# Patient Record
Sex: Female | Born: 1975
Health system: Southern US, Community
[De-identification: ages and names within clinical notes are randomized; demographics above are authoritative.]

## PROBLEM LIST (undated history)

## (undated) DIAGNOSIS — J3801 Paralysis of vocal cords and larynx, unilateral: Secondary | ICD-10-CM

## (undated) DIAGNOSIS — K219 Gastro-esophageal reflux disease without esophagitis: Secondary | ICD-10-CM

## (undated) DIAGNOSIS — R51 Headache: Secondary | ICD-10-CM

## (undated) DIAGNOSIS — G4733 Obstructive sleep apnea (adult) (pediatric): Secondary | ICD-10-CM

## (undated) DIAGNOSIS — G8929 Other chronic pain: Secondary | ICD-10-CM

## (undated) DIAGNOSIS — F32A Depression, unspecified: Secondary | ICD-10-CM

## (undated) DIAGNOSIS — N83202 Unspecified ovarian cyst, left side: Secondary | ICD-10-CM

## (undated) DIAGNOSIS — I499 Cardiac arrhythmia, unspecified: Secondary | ICD-10-CM

## (undated) DIAGNOSIS — R519 Headache, unspecified: Secondary | ICD-10-CM

## (undated) DIAGNOSIS — G43009 Migraine without aura, not intractable, without status migrainosus: Secondary | ICD-10-CM

## (undated) DIAGNOSIS — D510 Vitamin B12 deficiency anemia due to intrinsic factor deficiency: Secondary | ICD-10-CM

## (undated) DIAGNOSIS — R55 Syncope and collapse: Secondary | ICD-10-CM

## (undated) DIAGNOSIS — T7840XA Allergy, unspecified, initial encounter: Secondary | ICD-10-CM

## (undated) DIAGNOSIS — G4730 Sleep apnea, unspecified: Secondary | ICD-10-CM

## (undated) DIAGNOSIS — E039 Hypothyroidism, unspecified: Secondary | ICD-10-CM

## (undated) DIAGNOSIS — E119 Type 2 diabetes mellitus without complications: Secondary | ICD-10-CM

## (undated) DIAGNOSIS — F419 Anxiety disorder, unspecified: Secondary | ICD-10-CM

## (undated) DIAGNOSIS — M1990 Unspecified osteoarthritis, unspecified site: Secondary | ICD-10-CM

## (undated) HISTORY — DX: Unspecified ovarian cyst, left side: N83.202

## (undated) HISTORY — PX: WRIST SURGERY: SHX841

## (undated) HISTORY — PX: OVARIAN CYST REMOVAL: SHX89

## (undated) HISTORY — DX: Vitamin B12 deficiency anemia due to intrinsic factor deficiency: D51.0

## (undated) HISTORY — DX: Cardiac arrhythmia, unspecified: I49.9

## (undated) HISTORY — DX: Sleep apnea, unspecified: G47.30

## (undated) HISTORY — DX: Gastro-esophageal reflux disease without esophagitis: K21.9

## (undated) HISTORY — DX: Unspecified osteoarthritis, unspecified site: M19.90

## (undated) HISTORY — DX: Other chronic pain: G89.29

## (undated) HISTORY — DX: Anxiety disorder, unspecified: F41.9

## (undated) HISTORY — PX: WISDOM TOOTH EXTRACTION: SHX21

## (undated) HISTORY — DX: Hypothyroidism, unspecified: E03.9

## (undated) HISTORY — DX: Depression, unspecified: F32.A

## (undated) HISTORY — DX: Allergy, unspecified, initial encounter: T78.40XA

## (undated) HISTORY — DX: Obstructive sleep apnea (adult) (pediatric): G47.33

## (undated) HISTORY — DX: Headache, unspecified: R51.9

## (undated) HISTORY — DX: Migraine without aura, not intractable, without status migrainosus: G43.009

## (undated) HISTORY — DX: Paralysis of vocal cords and larynx, unilateral: J38.01

## (undated) HISTORY — PX: BREAST BIOPSY: SHX20

## (undated) HISTORY — DX: Headache: R51

---
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
## 2002-04-07 HISTORY — PX: CHOLECYSTECTOMY: SHX55

## 2004-01-06 DIAGNOSIS — R55 Syncope and collapse: Secondary | ICD-10-CM | POA: Insufficient documentation

## 2007-02-19 DIAGNOSIS — G43809 Other migraine, not intractable, without status migrainosus: Secondary | ICD-10-CM | POA: Insufficient documentation

## 2008-07-12 DIAGNOSIS — F4321 Adjustment disorder with depressed mood: Secondary | ICD-10-CM | POA: Insufficient documentation

## 2010-06-14 DIAGNOSIS — K449 Diaphragmatic hernia without obstruction or gangrene: Secondary | ICD-10-CM | POA: Insufficient documentation

## 2010-09-23 DIAGNOSIS — Z0000 Encounter for general adult medical examination without abnormal findings: Secondary | ICD-10-CM | POA: Insufficient documentation

## 2010-09-23 DIAGNOSIS — R55 Syncope and collapse: Secondary | ICD-10-CM | POA: Insufficient documentation

## 2011-05-22 DIAGNOSIS — M5430 Sciatica, unspecified side: Secondary | ICD-10-CM | POA: Insufficient documentation

## 2013-05-08 DIAGNOSIS — J209 Acute bronchitis, unspecified: Secondary | ICD-10-CM | POA: Insufficient documentation

## 2014-03-11 DIAGNOSIS — R635 Abnormal weight gain: Secondary | ICD-10-CM | POA: Insufficient documentation

## 2014-03-16 DIAGNOSIS — N83202 Unspecified ovarian cyst, left side: Secondary | ICD-10-CM

## 2014-03-16 HISTORY — DX: Unspecified ovarian cyst, left side: N83.202

## 2014-04-07 LAB — HM PAP SMEAR: HM Pap smear: NORMAL

## 2016-01-09 ENCOUNTER — Encounter (HOSPITAL_BASED_OUTPATIENT_CLINIC_OR_DEPARTMENT_OTHER): Payer: Self-pay | Admitting: Emergency Medicine

## 2016-01-09 ENCOUNTER — Emergency Department (HOSPITAL_BASED_OUTPATIENT_CLINIC_OR_DEPARTMENT_OTHER)
Admission: EM | Admit: 2016-01-09 | Discharge: 2016-01-10 | Disposition: A | Discharge status: Home / Self Care | Payer: 59 | Attending: Emergency Medicine | Admitting: Emergency Medicine

## 2016-01-09 ENCOUNTER — Emergency Department (HOSPITAL_BASED_OUTPATIENT_CLINIC_OR_DEPARTMENT_OTHER): Payer: 59

## 2016-01-09 DIAGNOSIS — R112 Nausea with vomiting, unspecified: Secondary | ICD-10-CM

## 2016-01-09 DIAGNOSIS — Z79899 Other long term (current) drug therapy: Secondary | ICD-10-CM | POA: Insufficient documentation

## 2016-01-09 DIAGNOSIS — R109 Unspecified abdominal pain: Secondary | ICD-10-CM | POA: Diagnosis present

## 2016-01-09 DIAGNOSIS — R197 Diarrhea, unspecified: Secondary | ICD-10-CM

## 2016-01-09 DIAGNOSIS — N83202 Unspecified ovarian cyst, left side: Secondary | ICD-10-CM | POA: Diagnosis not present

## 2016-01-09 HISTORY — DX: Syncope and collapse: R55

## 2016-01-09 LAB — URINALYSIS, ROUTINE W REFLEX MICROSCOPIC
Bilirubin Urine: NEGATIVE
GLUCOSE, UA: NEGATIVE mg/dL
KETONES UR: 15 mg/dL — AB
LEUKOCYTES UA: NEGATIVE
NITRITE: NEGATIVE
PH: 6 (ref 5.0–8.0)
PROTEIN: NEGATIVE mg/dL
Specific Gravity, Urine: >1.046 — ABNORMAL HIGH (ref 1.005–1.030)

## 2016-01-09 LAB — COMPREHENSIVE METABOLIC PANEL
ALBUMIN: 4.1 g/dL (ref 3.5–5.0)
ALT: 21 U/L (ref 14–54)
AST: 26 U/L (ref 15–41)
Alkaline Phosphatase: 41 U/L (ref 38–126)
Anion gap: 11 (ref 5–15)
BUN: 11 mg/dL (ref 6–20)
CHLORIDE: 101 mmol/L (ref 101–111)
CO2: 24 mmol/L (ref 22–32)
CREATININE: 0.71 mg/dL (ref 0.44–1.00)
Calcium: 9.1 mg/dL (ref 8.9–10.3)
GFR calc Af Amer: >60 mL/min (ref >60)
GFR calc non Af Amer: >60 mL/min (ref >60)
Glucose, Bld: 116 mg/dL — ABNORMAL HIGH (ref 65–99)
POTASSIUM: 3.6 mmol/L (ref 3.5–5.1)
SODIUM: 136 mmol/L (ref 135–145)
Total Bilirubin: 0.3 mg/dL (ref 0.3–1.2)
Total Protein: 7.9 g/dL (ref 6.5–8.1)

## 2016-01-09 LAB — CBC WITH DIFFERENTIAL/PLATELET
BASOS ABS: 0 10*3/uL (ref 0.0–0.1)
Basophils Relative: 0 %
EOS ABS: 0.1 10*3/uL (ref 0.0–0.7)
EOS PCT: 0 %
HCT: 40.3 % (ref 36.0–46.0)
Hemoglobin: 12.8 g/dL (ref 12.0–15.0)
LYMPHS PCT: 10 %
Lymphs Abs: 2.3 10*3/uL (ref 0.7–4.0)
MCH: 27.4 pg (ref 26.0–34.0)
MCHC: 31.8 g/dL (ref 30.0–36.0)
MCV: 86.3 fL (ref 78.0–100.0)
MONO ABS: 1.6 10*3/uL — AB (ref 0.1–1.0)
Monocytes Relative: 7 %
Neutro Abs: 18.9 10*3/uL — ABNORMAL HIGH (ref 1.7–7.7)
Neutrophils Relative %: 83 %
PLATELETS: 371 10*3/uL (ref 150–400)
RBC: 4.67 MIL/uL (ref 3.87–5.11)
RDW: 14.3 % (ref 11.5–15.5)
WBC: 22.9 10*3/uL — AB (ref 4.0–10.5)

## 2016-01-09 LAB — URINE MICROSCOPIC-ADD ON: WBC UA: NONE SEEN WBC/hpf (ref 0–5)

## 2016-01-09 LAB — PREGNANCY, URINE: PREG TEST UR: NEGATIVE

## 2016-01-09 LAB — LIPASE, BLOOD: LIPASE: 28 U/L (ref 11–51)

## 2016-01-09 MED ORDER — ONDANSETRON HCL 4 MG/2ML IJ SOLN
4.0000 mg | Freq: Once | INTRAMUSCULAR | Status: AC
  Administered 2016-01-09: 4 mg via INTRAVENOUS
  Filled 2016-01-09: qty 2

## 2016-01-09 MED ORDER — KETOROLAC TROMETHAMINE 30 MG/ML IJ SOLN
30.0000 mg | Freq: Once | INTRAMUSCULAR | Status: AC
  Administered 2016-01-09: 30 mg via INTRAVENOUS
  Filled 2016-01-09: qty 1

## 2016-01-09 MED ORDER — ONDANSETRON 8 MG PO TBDP: 8.0000 mg | ORAL_TABLET | Freq: Three times a day (TID) | ORAL | 0 refills | Status: DC | PRN

## 2016-01-09 MED ORDER — SODIUM CHLORIDE 0.9 % IV BOLUS (SEPSIS)
1000.0000 mL | Freq: Once | INTRAVENOUS | Status: AC
  Administered 2016-01-09: 1000 mL via INTRAVENOUS

## 2016-01-09 MED ORDER — IOPAMIDOL (ISOVUE-300) INJECTION 61%
100.0000 mL | Freq: Once | INTRAVENOUS | Status: AC | PRN
  Administered 2016-01-09: 100 mL via INTRAVENOUS

## 2016-01-09 NOTE — ED Provider Notes (Signed)
[facility] Provider Note   CSN: [id number] Arrival date & time: 01/09/16  1737  By signing my name below, I, Soijett Blue, attest that this documentation has been prepared under the direction and in the presence of Santiago Glad, PA-C Electronically Signed: Soijett Blue, ED Scribe. 01/09/16. 5:55 PM.   History   Chief Complaint Chief Complaint  Patient presents with  . Abdominal Pain    HPI  Debra West is a 40 y.o. female who presents to the Emergency Department complaining of abdominal cramping onset 4 PM today. Pt notes that her symptoms began with HA and nausea. Pt denies sick contacts. She states that she is having associated symptoms of nausea, vomiting x 5 episodes, diarrhea x 5 episodes, and HA. She states that she has not tried any medications for the relief of her symptoms. She denies blood in stool, fever, dysuria, urinary frequency, and any other symptoms. Denies recent hospitalization or foreign travel. Pt states that she has had a cholecystectomy in 2004 and ovarian cyst removal in 2010.    The history is provided by the patient. No language interpreter was used.    Past Medical History:  Diagnosis Date  . Vasodepressor syncope     There are no active problems to display for this patient.   Past Surgical History:  Procedure Laterality Date  . CHOLECYSTECTOMY    . OVARIAN CYST REMOVAL      OB History    No data available       Home Medications    Prior to Admission medications   Medication Sig Start Date End Date Taking? Authorizing Provider  UNKNOWN TO PATIENT Birth control   Yes Historical Provider, MD    Family History No family history on file.  Social History Social History  Substance Use Topics  . Smoking status: Never Smoker  . Smokeless tobacco: Current User  . Alcohol use Not on file     Allergies   Review of patient's allergies indicates no known allergies.   Review of Systems Review of Systems    Constitutional: Negative for fever.  Gastrointestinal: Positive for abdominal pain, diarrhea, nausea and vomiting. Negative for blood in stool.  Genitourinary: Negative for dysuria and frequency.  Neurological: Positive for headaches.     Physical Exam Updated Vital Signs BP 99/65 (BP Location: Right Arm)   Pulse 78   Temp 98.8 F (37.1 C) (Oral)   Resp 18   Ht 5\' 6"  (1.676 m)   Wt 250 lb (113.4 kg)   LMP 12/19/2015   SpO2 100%   BMI 40.35 kg/m   Physical Exam  Constitutional: She is oriented to person, place, and time. She appears well-developed and well-nourished. No distress.  HENT:  Head: Normocephalic and atraumatic.  Eyes: EOM are normal.  Neck: Neck supple.  Cardiovascular: Normal rate, regular rhythm and normal heart sounds.  Exam reveals no gallop and no friction rub.   No murmur heard. Pulmonary/Chest: Effort normal and breath sounds normal. No respiratory distress. She has no wheezes. She has no rales.  Abdominal: Soft. She exhibits no distension. There is tenderness in the suprapubic area and left lower quadrant. There is no rebound and no guarding.  Mild TTP across LLQ and suprapubic  Musculoskeletal: Normal range of motion.  Neurological: She is alert and oriented to person, place, and time. She has normal strength. No cranial nerve deficit. Gait normal. GCS eye subscore is 4. GCS verbal subscore is 5. GCS motor subscore is 6.  Skin:  Skin is warm and dry.  Psychiatric: She has a normal mood and affect. Her behavior is normal.  Nursing note and vitals reviewed.    ED Treatments / Results  DIAGNOSTIC STUDIES: Oxygen Saturation is 95% on RA, adequate by my interpretation.    COORDINATION OF CARE: 5:52 PM Discussed treatment plan with pt at bedside which includes zofran, IV fluids, labs, and pt agreed to plan.   Labs (all labs ordered are listed, but only abnormal results are displayed) Labs Reviewed  CBC WITH DIFFERENTIAL/PLATELET - Abnormal; Notable  for the following:       Result Value   WBC 22.9 (*)    Neutro Abs 18.9 (*)    Monocytes Absolute 1.6 (*)    All other components within normal limits  COMPREHENSIVE METABOLIC PANEL - Abnormal; Notable for the following:    Glucose, Bld 116 (*)    All other components within normal limits  URINALYSIS, ROUTINE W REFLEX MICROSCOPIC (NOT AT [facility]) - Abnormal; Notable for the following:    Specific Gravity, Urine >1.046 (*)    Hgb urine dipstick TRACE (*)    Ketones, ur 15 (*)    All other components within normal limits  URINE MICROSCOPIC-ADD ON - Abnormal; Notable for the following:    Squamous Epithelial / LPF 0-5 (*)    Bacteria, UA RARE (*)    All other components within normal limits  LIPASE, BLOOD  PREGNANCY, URINE    EKG  EKG Interpretation None       Radiology Ct Abdomen Pelvis W Contrast  Result Date: 01/09/2016 CLINICAL DATA:  Lower abdominal pain beginning today with headache, nausea, vomiting common diarrhea. History of cholecystectomy and ovarian cyst removal. EXAM: CT ABDOMEN AND PELVIS WITH CONTRAST TECHNIQUE: Multidetector CT imaging of the abdomen and pelvis was performed using the standard protocol following bolus administration of intravenous contrast. CONTRAST:  ISOVUE-300 IOPAMIDOL (ISOVUE-300) INJECTION 61% COMPARISON:  None. FINDINGS: Lower chest: Clear lung bases. Hepatobiliary: Diffusely decreased attenuation of the liver consistent with steatosis. No focal liver abnormality identified. Prior cholecystectomy. No biliary dilatation. Pancreas: Unremarkable. Spleen: Unremarkable. Adrenals/Urinary Tract: Unremarkable adrenal glands. No evidence of renal mass, calculi, or hydronephrosis. No ureteral calculi or ureteral dilatation. Unremarkable bladder. Stomach/Bowel: The stomach and is within normal limits. Oral contrast is present in multiple nondilated loops of small bowel without evidence of obstruction. There is left-sided colonic diverticulosis without  evidence of diverticulitis. The appendix is not clearly identified, however no inflammatory changes are seen in the right lower quadrant. Vascular/Lymphatic: No significant vascular findings are present. No enlarged abdominal or pelvic lymph nodes. Reproductive: Uterus and right ovary are unremarkable. The left ovary is asymmetrically enlarged with multiple hypoattenuating foci suggestive of cysts measuring up to 3.3 cm. Other: No intraperitoneal free fluid. No abdominal wall mass or hernia. Musculoskeletal: Mild thoracolumbar spondylosis. IMPRESSION: 1. No definite acute abnormality identified in the abdomen or pelvis. 2. Enlarged left ovary likely containing multiple cysts. Pelvic ultrasound could provide further characterization if there is clinical concern for a gynecologic cause of the patient's acute pain. 3. Hepatic steatosis. Electronically Signed   By: Sebastian Ache M.D.   On: 01/09/2016 22:37    Procedures Procedures (including critical care time)  Medications Ordered in ED Medications  ketorolac (TORADOL) 30 MG/ML injection 30 mg (not administered)  ondansetron (ZOFRAN) injection 4 mg (4 mg Intravenous Given 01/09/16 1747)  sodium chloride 0.9 % bolus 1,000 mL (0 mLs Intravenous Stopped 01/09/16 1906)  ondansetron (ZOFRAN) injection 4 mg (  4 mg Intravenous Given 01/09/16 1957)  iopamidol (ISOVUE-300) 61 % injection 100 mL (100 mLs Intravenous Contrast Given 01/09/16 2044)     Initial Impression / Assessment and Plan / ED Course  I have reviewed the triage vital signs and the nursing notes.  Pertinent labs & imaging results that were available during my care of the patient were reviewed by me and considered in my medical decision making (see chart for details).  Clinical Course     Final Clinical Impressions(s) / ED Diagnoses   Final diagnoses:  None   Patient presents today with nausea, vomiting, and diarrhea onset earlier today.  Labs showing leukocytosis, but otherwise  unremarkable.  She does have some LLQ abdominal pain on exam.  CT showing left ovarian cyst, but negative for acute findings.  Discussed CT results with the patient.  She declines pelvic exam or ultrasound.  She states that she knew about the ovarian cysts.  Nausea and pain improved in the ED.  Patient able to tolerate PO liquids prior to discharge.  No episodes of vomiting while in the ED.  Feel that the patient is stable for discharge.  Return precautions given.     New Prescriptions New Prescriptions   No medications on file   I personally performed the services described in this documentation, which was scribed in my presence. The recorded information has been reviewed and is accurate.     Santiago GladHeather Lenzi Marmo, PA-C 01/11/16 1127    Melene Planan Floyd, DO 01/12/16 [phone]

## 2016-01-09 NOTE — ED Notes (Signed)
Patient reports that she has no urge to urinate at this time, and states that so many people have asked. Reports that she has not voided since she started the N/V  - at 4 pm

## 2016-01-09 NOTE — ED Triage Notes (Signed)
N/V/D with some abdominal pain since 4 pm.  Pt had lunch at 1:30.  Pt vomitted 4-5 times.  Diarrhea x 4-5 times.

## 2016-01-09 NOTE — ED Notes (Signed)
Patient asked to go to the bathroom to obtain a specimen and asked if she can go patient replie "nope, I am super dehydrated"  - patient states that "maybe after the CT scan after I drink all that contrast"

## 2016-01-17 ENCOUNTER — Encounter: Payer: Self-pay | Admitting: Family Medicine

## 2016-01-17 ENCOUNTER — Ambulatory Visit (INDEPENDENT_AMBULATORY_CARE_PROVIDER_SITE_OTHER): Payer: 59 | Admitting: Family Medicine

## 2016-01-17 ENCOUNTER — Telehealth: Payer: Self-pay | Admitting: Family Medicine

## 2016-01-17 VITALS — BP 122/78 | HR 74 | Temp 99.2°F | Ht 66.0 in | Wt 246.6 lb

## 2016-01-17 DIAGNOSIS — Z1231 Encounter for screening mammogram for malignant neoplasm of breast: Secondary | ICD-10-CM

## 2016-01-17 DIAGNOSIS — K529 Noninfective gastroenteritis and colitis, unspecified: Secondary | ICD-10-CM | POA: Diagnosis not present

## 2016-01-17 DIAGNOSIS — E039 Hypothyroidism, unspecified: Secondary | ICD-10-CM

## 2016-01-17 DIAGNOSIS — Z1239 Encounter for other screening for malignant neoplasm of breast: Secondary | ICD-10-CM

## 2016-01-17 HISTORY — DX: Hypothyroidism, unspecified: E03.9

## 2016-01-17 NOTE — Progress Notes (Signed)
Chief Complaint  Patient presents with  . Establish Care    Pt reports being in the ED x1 week ago/ BP dropped and was experiencing vomitting        New Patient Visit SUBJECTIVE: HPI: Debra West is an 40 y.o.female who is being seen for establishing care.  The patient was previously seen at a clinic in [place].  Went to ED for N/V/D; improving, but slow. Started tolerating food yesterday. Continues to drink lots of fluids such as Propel and 7up. No fevers. Given Zofran in ED.  Dx'd with vasodepressor syncope. Has been to cardiologist in past and has had many tests done that were all negative. Is currently on Midodrine for this.  Hx of low thyroid, unsure what from. She is on [employer] right now. Does well on current dose, any higher and she does not feel right.  Allergies  Allergen Reactions  . Aspirin Nausea And Vomiting    Past Medical History:  Diagnosis Date  . Hypothyroidism 01/17/2016  . Vasodepressor syncope    Past Surgical History:  Procedure Laterality Date  . CHOLECYSTECTOMY    . OVARIAN CYST REMOVAL     Social History   Social History  . Marital status: Single   Social History Main Topics  . Smoking status: Never Smoker  . Smokeless tobacco: Current User  . Alcohol use Yes     Comment: Occasionally  . Drug use: No   History reviewed. No pertinent family history.   Current Outpatient Prescriptions:  .  albuterol (PROVENTIL) (2.5 MG/3ML) 0.083% nebulizer solution, Take 2.5 mg by nebulization every 4 (four) hours as needed for wheezing or shortness of breath., Disp: , Rfl:  .  FLUoxetine (PROZAC) 20 MG tablet, Take 20 mg by mouth daily., Disp: , Rfl:  .  midodrine (PROAMATINE) 5 MG tablet, Take 5 mg by mouth 3 (three) times daily with meals., Disp: , Rfl:  .  norethindrone-ethinyl estradiol-iron (MICROGESTIN FE,GILDESS FE,LOESTRIN FE) 1.5-30 MG-MCG tablet, Take 1 tablet by mouth daily., Disp: , Rfl:  .  Thyroid (NATURE-THROID) 48.75 MG TABS, Take 1  tablet by mouth., Disp: , Rfl:  .  UNKNOWN TO PATIENT, Birth control, Disp: , Rfl:  .  ondansetron (ZOFRAN ODT) 8 MG disintegrating tablet, Take 1 tablet (8 mg total) by mouth every 8 (eight) hours as needed for nausea or vomiting. (Patient not taking: Reported on 01/17/2016), Disp: 20 tablet, Rfl: 0 Patient's last menstrual period was 12/16/2015.  ROS Const: Denies fever   OBJECTIVE: BP 122/78 (BP Location: Right Arm, Patient Position: Sitting, Cuff Size: Large)   Pulse 74   Temp 99.2 F (37.3 C) (Oral)   Ht 5\' 6"  (1.676 m)   Wt 246 lb 9.6 oz (111.9 kg)   LMP 12/16/2015   SpO2 98%   BMI 39.80 kg/m   Constitutional: -  VS reviewed -  Well developed, well nourished, appears stated age -  No apparent distress  Psychiatric: -  Oriented to person, place, and time -  Memory intact -  Flat affect and mood normal -  Fluent conversation -  Judgment and insight age appropriate  ENMT: -  Ears are patent b/l without erythema or discharge. TM's are shiny and clear b/l without evidence of effusion or infection. -  Oral mucosa without lesions, tongue and uvula midline    Tonsils not enlarged, no erythema, no exudate, trachea midline    Pharynx moist, no lesions, no erythema  Neck: -  No gross swelling, no palpable  masses -  Thyroid midline, not enlarged, mobile, no palpable masses  Cardiovascular: -  RRR, no murmurs -  No LE edema  Respiratory: -  Normal respiratory effort, no accessory muscle use, no retraction -  Breath sounds equal, no wheezes, no ronchi, no crackles  Gastrointestinal: -  Bowel sounds normal -  No tenderness, no distention, no guarding, no masses  Musculoskeletal: -  No clubbing, no cyanosis -  Gait normal   ASSESSMENT/PLAN: Gastroenteritis  Hypothyroidism, unspecified type  Screening for malignant neoplasm of breast - Plan: MM DIGITAL SCREENING BILATERAL  Patient instructed to sign release of records form from her previous PCP. Instructed to choose foods that  do not upset her stomach as she continues to recover. Stay hydrated with sports drinks/Pedialyte. Pt was asking about a workup, which I stated I did not think was indicated at this time given the likely dx of a gastroenteritis that is resolving.  After pt left, front office staff came to inform me that the patient was very disgruntled with her visit and stated that her provider did not do anything. She is going out of the country soon and wanted to be seen again for an ED f/u. I will have her change to whatever new provider she schedules with for future appointments.  Can recheck labs in 1 year. Patient should return prn. The patient voiced understanding and agreement to the plan.   Jilda Rocheicholas Paul ParisWendling, DO 01/17/16  3:06 PM

## 2016-01-17 NOTE — Telephone Encounter (Signed)
°  Relation to FA:OZHYpt:self Call back number:[phone]  Reason for call: pt had new pt appt with Dr. Carmelia RollerWendling on 01/17/16, pt looked very disturbed when I checked her out so I ask her was everything ok, she stated to me " no I think I need another doctor, I ask why and she informed me that Dr Carmelia RollerWendling did not do anything and she felt like she wasted her time. I apologized and explained to her what happens with a new pt appt. She then informed me that she had went to the ER and she had mentioned all of that to Dr. Carmelia RollerWendling and he the only thing he said to her was "she would be ok, pt states that she has seen a lot of doctors and this was the weirdess appointment she had ever had. Pt stated she was leaving going out of town on Sunday and was really concerned about her health, I scheduled a ER follow-up with Ramon DredgeEdward and informed her that I would speak with my supervisor in regards to switching doctors .

## 2016-01-17 NOTE — Progress Notes (Signed)
Pre visit review using our clinic review tool, if applicable. No additional management support is needed unless otherwise documented below in the visit note. 

## 2016-01-18 ENCOUNTER — Ambulatory Visit (INDEPENDENT_AMBULATORY_CARE_PROVIDER_SITE_OTHER): Payer: 59 | Admitting: Medical

## 2016-01-18 ENCOUNTER — Ambulatory Visit: Payer: 59 | Admitting: Medical

## 2016-01-18 ENCOUNTER — Encounter: Payer: Self-pay | Admitting: Medical

## 2016-01-18 VITALS — BP 120/75 | HR 72 | Temp 98.9°F | Ht 66.0 in | Wt 246.0 lb

## 2016-01-18 DIAGNOSIS — R8299 Other abnormal findings in urine: Secondary | ICD-10-CM

## 2016-01-18 DIAGNOSIS — N83202 Unspecified ovarian cyst, left side: Secondary | ICD-10-CM | POA: Diagnosis not present

## 2016-01-18 DIAGNOSIS — D519 Vitamin B12 deficiency anemia, unspecified: Secondary | ICD-10-CM

## 2016-01-18 DIAGNOSIS — R1032 Left lower quadrant pain: Secondary | ICD-10-CM

## 2016-01-18 DIAGNOSIS — R112 Nausea with vomiting, unspecified: Secondary | ICD-10-CM | POA: Diagnosis not present

## 2016-01-18 DIAGNOSIS — E039 Hypothyroidism, unspecified: Secondary | ICD-10-CM

## 2016-01-18 DIAGNOSIS — R195 Other fecal abnormalities: Secondary | ICD-10-CM | POA: Diagnosis not present

## 2016-01-18 DIAGNOSIS — R82998 Other abnormal findings in urine: Secondary | ICD-10-CM

## 2016-01-18 LAB — CBC WITH DIFFERENTIAL/PLATELET
BASOS ABS: 0 10*3/uL (ref 0.0–0.1)
Basophils Relative: 0.3 % (ref 0.0–3.0)
EOS ABS: 0.2 10*3/uL (ref 0.0–0.7)
Eosinophils Relative: 2.4 % (ref 0.0–5.0)
HEMATOCRIT: 38.2 % (ref 36.0–46.0)
HEMOGLOBIN: 12.8 g/dL (ref 12.0–15.0)
LYMPHS PCT: 26 % (ref 12.0–46.0)
Lymphs Abs: 2.5 10*3/uL (ref 0.7–4.0)
MCHC: 33.4 g/dL (ref 30.0–36.0)
MCV: 82.3 fl (ref 78.0–100.0)
MONOS PCT: 6.2 % (ref 3.0–12.0)
Monocytes Absolute: 0.6 10*3/uL (ref 0.1–1.0)
Neutro Abs: 6.3 10*3/uL (ref 1.4–7.7)
Neutrophils Relative %: 65.1 % (ref 43.0–77.0)
PLATELETS: 378 10*3/uL (ref 150.0–400.0)
RBC: 4.65 Mil/uL (ref 3.87–5.11)
RDW: 14.7 % (ref 11.5–15.5)
WBC: 9.6 10*3/uL (ref 4.0–10.5)

## 2016-01-18 LAB — COMPREHENSIVE METABOLIC PANEL
ALBUMIN: 4.3 g/dL (ref 3.5–5.2)
ALK PHOS: 41 U/L (ref 39–117)
ALT: 29 U/L (ref 0–35)
AST: 29 U/L (ref 0–37)
BILIRUBIN TOTAL: 0.3 mg/dL (ref 0.2–1.2)
BUN: 8 mg/dL (ref 6–23)
CALCIUM: 9.3 mg/dL (ref 8.4–10.5)
CO2: 29 meq/L (ref 19–32)
CREATININE: 0.56 mg/dL (ref 0.40–1.20)
Chloride: 100 mEq/L (ref 96–112)
GFR: 126.55 mL/min (ref >60.00)
Glucose, Bld: 89 mg/dL (ref 70–99)
Potassium: 4.1 mEq/L (ref 3.5–5.1)
Sodium: 136 mEq/L (ref 135–145)
TOTAL PROTEIN: 7.4 g/dL (ref 6.0–8.3)

## 2016-01-18 LAB — POCT URINALYSIS DIPSTICK
BILIRUBIN UA: NEGATIVE
GLUCOSE UA: NEGATIVE
KETONES UA: NEGATIVE
LEUKOCYTES UA: NEGATIVE
NITRITE UA: NEGATIVE
Spec Grav, UA: 1.015
Urobilinogen, UA: NEGATIVE
pH, UA: 6.5

## 2016-01-18 LAB — TSH: TSH: 3.42 u[IU]/mL (ref 0.35–4.50)

## 2016-01-18 LAB — VITAMIN B12: Vitamin B-12: 495 pg/mL (ref 211–911)

## 2016-01-18 LAB — T4, FREE: Free T4: 0.63 ng/dL (ref 0.60–1.60)

## 2016-01-18 MED ORDER — CIPROFLOXACIN HCL 500 MG PO TABS: 500.0000 mg | ORAL_TABLET | Freq: Two times a day (BID) | ORAL | 0 refills | Status: DC

## 2016-01-18 MED ORDER — METRONIDAZOLE 500 MG PO TABS: 500.0000 mg | ORAL_TABLET | Freq: Three times a day (TID) | ORAL | 0 refills | Status: DC

## 2016-01-18 MED ORDER — HYOSCYAMINE SULFATE ER 0.375 MG PO TB12: 0.3750 mg | ORAL_TABLET | Freq: Two times a day (BID) | ORAL | 0 refills | Status: DC

## 2016-01-18 NOTE — Patient Instructions (Addendum)
For possible ovarian cyst pain can use alleve otc as you have used before. Stop ibuprofen. Also refer to gyn  For nausea use zofran ED gave you.  For loose stools possible ibs rx levbid. But will refer to GI   If worse flank pain/llq pain or loose stools while out of town start flagyl and cipro. But if severe pain or changing type pain then ED evaluation even while out of town. Very important as may need new imaging studies. Go ahead and do stool panel studies and ifob.  Will check your tsh, t4 and b12 level today.  Urine dip and culture.  Foillow up in 10 days or as needed  Pt was advised today that if she worsens over weekend and next week then be seen by ED. I advised this particularly since she is leaving town and will be gone for about 10 days. Pt agreed and expressed understanding.

## 2016-01-18 NOTE — Progress Notes (Signed)
Pre visit review using our clinic tool,if applicable. No additional management support is needed unless otherwise documented below in the visit note.    Patient was seen in ED 01/09/2016. Was seen by Dr. Carmelia RollerWendling  On 01/16/13.

## 2016-01-18 NOTE — Progress Notes (Signed)
Subjective:    Patient ID: Debra West, female    DOB: [birth year], 40 y.o.   MRN: [id number]  HPI   Pt was seen yesterday by Dr. Carmelia RollerWendling. She was not happy with visit?  Pt states she does not feel 100% since ED eval. Pt states when she eats has had low level to moderate left lower quadrant pain. Pt feels nausea at times. No vomiting. LMP. Pt just finished cycle. First day this past Sunday. On ocp.  Pt has some intermittent loose stools in the past. Pt has some periodic loose stools in the past over the years. In past former provider thought maybe she had IBS. Pt did see GI specialist in the past about 3 years ago. Pt states she in past had resorted to herbal supplaments which seemed to help some. States also in past accupuncutre helped her digestion. No hx of blood in her stools. No fh of inflammatory bowel diseases.   Pt states that recent loose stools prior to  ED visit was worse than her baseline loose stool history before she got sick.  Pt did have wbc of 22.9 the other day in ED. Pt bp is better than it was in the ED. Pt sugars in ED mild elevated. Pancrease was not elevated. Pt has known hx of ovarian cyst. Gyn put her on birth control. Pt states in past had some pain associated with cyst. She states pain location different.   Pt has known partial hiatal hernia.  Pt feels little bit better today than she was on wed and yesterday.  Pt is leaving town this weekend to go on work trip. Traveling to [place].    Review of Systems  Constitutional: Negative for chills, diaphoresis, fatigue and fever.  HENT: Negative for congestion, ear discharge, postnasal drip, sinus pressure and sore throat.   Respiratory: Negative for cough, choking, chest tightness, shortness of breath and wheezing.   Cardiovascular: Negative for chest pain and palpitations.  Gastrointestinal: Positive for abdominal pain, diarrhea and nausea. Negative for abdominal distention, blood in stool, constipation,  rectal pain and vomiting.  Genitourinary: Negative for difficulty urinating, dysuria, flank pain, frequency, pelvic pain, urgency and vaginal pain.       Hx of left ovarian cyst.  Musculoskeletal: Negative for back pain.  Neurological: Negative for dizziness, weakness and headaches.  Hematological: Negative for adenopathy. Does not bruise/bleed easily.  Psychiatric/Behavioral: Negative for agitation, dysphoric mood and suicidal ideas. The patient is not nervous/anxious.     Past Medical History:  Diagnosis Date  . Anemia    pernicious anemia. hx.  . GERD (gastroesophageal reflux disease)   . Hypothyroidism 01/17/2016  . Vasodepressor syncope      Social History   Social History  . Marital status: Single    Spouse name: N/A  . Number of children: N/A  . Years of education: N/A   Occupational History  . Not on file.   Social History Main Topics  . Smoking status: Never Smoker  . Smokeless tobacco: Current User  . Alcohol use Yes     Comment: Occasionally  . Drug use: No  . Sexual activity: Not on file   Other Topics Concern  . Not on file   Social History Narrative  . No narrative on file    Past Surgical History:  Procedure Laterality Date  . CHOLECYSTECTOMY    . OVARIAN CYST REMOVAL      No family history on file.  Allergies  Allergen Reactions  . Aspirin  Nausea And Vomiting    Current Outpatient Prescriptions on File Prior to Visit  Medication Sig Dispense Refill  . albuterol (PROVENTIL) (2.5 MG/3ML) 0.083% nebulizer solution Take 2.5 mg by nebulization every 4 (four) hours as needed for wheezing or shortness of breath.    Marland Kitchen FLUoxetine (PROZAC) 20 MG tablet Take 20 mg by mouth daily.    . midodrine (PROAMATINE) 5 MG tablet Take 5 mg by mouth 3 (three) times daily with meals.    . norethindrone-ethinyl estradiol-iron (MICROGESTIN FE,GILDESS FE,LOESTRIN FE) 1.5-30 MG-MCG tablet Take 1 tablet by mouth daily.    . Thyroid (NATURE-THROID) 48.75 MG TABS Take  1 tablet by mouth.    Marland Kitchen UNKNOWN TO PATIENT Birth control    . ondansetron (ZOFRAN ODT) 8 MG disintegrating tablet Take 1 tablet (8 mg total) by mouth every 8 (eight) hours as needed for nausea or vomiting. (Patient not taking: Reported on 01/18/2016) 20 tablet 0   No current facility-administered medications on file prior to visit.     BP 120/75   Pulse 72   Temp 98.9 F (37.2 C) (Oral)   Ht 5\' 6"  (1.676 m)   Wt 246 lb (111.6 kg)   LMP 12/16/2015   SpO2 98%   BMI 39.71 kg/m       Objective:   Physical Exam  General Mental Status- Alert. General Appearance- Not in acute distress.   Skin General: Color- Normal Color. Moisture- Normal Moisture.  Neck Carotid Arteries- Normal color. Moisture- Normal Moisture. No carotid bruits. No JVD.  Chest and Lung Exam Auscultation: Breath Sounds:-Normal.  Cardiovascular Auscultation:Rythm- Regular. Murmurs & Other Heart Sounds:Auscultation of the heart reveals- No Murmurs.  Abdomen Inspection:-Inspeection Normal. Palpation/Percussion:Note:No mass. Palpation and Percussion of the abdomen reveal- faint left flank pain and mild-moderate left lower quadrant  Tenderness, Non Distended + BS, no rebound or guarding. No lt upper quadrant tenderness.    Neurologic Cranial Nerve exam:- CN III-XII intact(No nystagmus), symmetric smile. Strength:- 5/5 equal and symmetric strength both upper and lower extremities.  Back- no cva tenderness.      Assessment & Plan:  For possible ovarian cyst pain can use alleve otc as you have used before. Stop ibuprofen. Also refer to gyn  For nausea use zofran ED gave you.  For loose stools possible ibs rx levbid. But will refer to GI   If worse flank pain/llq pain or loose stools while out of town start flagyl and cipro. But if severe pain or changing type pain then ED evaluation even while out of town. Very important as may need new imaging studies.  Will check your tsh, t4 and b12 level  today.  Urine dip and culture.  Foillow up in 10 days or as needed  Garielle Mroz, Ramon Dredge, PA-C   Pt was advised today that if she worsens over weekend and next week then be seen by ED. I advised this particularly since she is leaving town and will be gone for about 10 days. Pt agreed and expressed understanding.

## 2016-01-20 LAB — URINE CULTURE: Organism ID, Bacteria: NO GROWTH

## 2016-01-21 ENCOUNTER — Telehealth: Payer: Self-pay | Admitting: Family Medicine

## 2016-01-21 NOTE — Telephone Encounter (Signed)
Attempted to reach out to pt to apologize for any inconvenience I caused her on Thursday. If she calls back, please notify me so I may speak to her in person. Thanks.

## 2016-01-31 ENCOUNTER — Ambulatory Visit (INDEPENDENT_AMBULATORY_CARE_PROVIDER_SITE_OTHER): Payer: 59 | Admitting: Medical

## 2016-01-31 VITALS — BP 131/74 | HR 78 | Temp 99.1°F | Wt 247.2 lb

## 2016-01-31 DIAGNOSIS — R109 Unspecified abdominal pain: Secondary | ICD-10-CM

## 2016-01-31 LAB — POC URINALSYSI DIPSTICK (AUTOMATED)
BILIRUBIN UA: NEGATIVE
GLUCOSE UA: NEGATIVE
KETONES UA: NEGATIVE
LEUKOCYTES UA: NEGATIVE
Nitrite, UA: NEGATIVE
Protein, UA: NEGATIVE
Spec Grav, UA: 1.015
Urobilinogen, UA: 4
pH, UA: 6

## 2016-01-31 LAB — POCT URINE PREGNANCY: Preg Test, Ur: NEGATIVE

## 2016-01-31 NOTE — Progress Notes (Signed)
Pre visit review using our clinic review tool, if applicable. No additional management support is needed unless otherwise documented below in the visit note. 

## 2016-01-31 NOTE — Patient Instructions (Addendum)
For your abdomen pain do want you to start the cipro and flagyl. Also I want you to call GI and establish the visit. Will get Victorino DikeJennifer to give you the number..  For your left ovary cyst area pain will contact our referral staff and check on status of the referral.  I do want to get cbc and cmp tomorrow.If pain worsening and GI appointment delayed may get repeat ct scan.   Will follow and repeat your urine in one week. Recheck for blood. If persist might consider urologist referral.(note pt reports she had work up one year ago by urologist for some dysuria. She describes cystoscopy and other test which she states all were negative)  Follow up in 7 days or as needed

## 2016-01-31 NOTE — Telephone Encounter (Signed)
Cancelling stool test and ifob since she never got those done. Will  Send not to lab staff. If she comes in to reactivate/reorder all the test.

## 2016-01-31 NOTE — Progress Notes (Signed)
Subjective:    Patient ID: Rosealynn Mateus, female    DOB: August 05, 1975, 40 y.o.   MRN: [id number]  HPI  Pt in for follow up.   Pt states still has left lower abdomen pain. Pt states loose stools stopped. But she has same left of discomfort since last visit. Not worse/about the same.   Pt states some fevers, but no chills and sweats off and on. Pt feels feverish couple of hours almost every day. Highest temp was 100.  Pt has no bloody stools. She does think about last week had one stool had  black appearance. But  has used peptobismol around that time.  I had instructed pt to take cipro and flaygl. I only called her and stated labs look good. Pt misinterpreted that normal labs meant not to take antibiotics which was not the case.  LMP- 2 weeks ago. Came when she expected.   Pt never turned in stool panel kit. Her loose stools got better.    Review of Systems  Constitutional: Positive for fever. Negative for chills and fatigue.  Respiratory: Negative for cough, chest tightness, shortness of breath and wheezing.   Cardiovascular: Negative for chest pain and palpitations.  Gastrointestinal: Positive for abdominal pain. Negative for abdominal distention, anal bleeding, blood in stool, constipation, diarrhea, nausea, rectal pain and vomiting.  Genitourinary: Negative for dysuria, frequency, pelvic pain and urgency.  Musculoskeletal: Negative for back pain.       But some faint on exam.  Neurological: Negative for headaches.  Hematological: Negative for adenopathy. Does not bruise/bleed easily.  Psychiatric/Behavioral: Negative for behavioral problems, confusion and sleep disturbance. The patient is not nervous/anxious.     Past Medical History:  Diagnosis Date  . Anemia    pernicious anemia. hx.  . GERD (gastroesophageal reflux disease)   . Hypothyroidism 01/17/2016  . Vasodepressor syncope      Social History   Social History  . Marital status: Single    Spouse name: N/A    . Number of children: N/A  . Years of education: N/A   Occupational History  . Not on file.   Social History Main Topics  . Smoking status: Never Smoker  . Smokeless tobacco: Current User  . Alcohol use Yes     Comment: Occasionally  . Drug use: No  . Sexual activity: Not on file   Other Topics Concern  . Not on file   Social History Narrative  . No narrative on file    Past Surgical History:  Procedure Laterality Date  . CHOLECYSTECTOMY    . OVARIAN CYST REMOVAL      No family history on file.  Allergies  Allergen Reactions  . Aspirin Nausea And Vomiting    Current Outpatient Prescriptions on File Prior to Visit  Medication Sig Dispense Refill  . albuterol (PROVENTIL) (2.5 MG/3ML) 0.083% nebulizer solution Take 2.5 mg by nebulization every 4 (four) hours as needed for wheezing or shortness of breath.    . ciprofloxacin (CIPRO) 500 MG tablet Take 1 tablet (500 mg total) by mouth 2 (two) times daily. 14 tablet 0  . FLUoxetine (PROZAC) 20 MG tablet Take 20 mg by mouth daily.    . hyoscyamine (LEVBID) 0.375 MG 12 hr tablet Take 1 tablet (0.375 mg total) by mouth 2 (two) times daily. 30 tablet 0  . metroNIDAZOLE (FLAGYL) 500 MG tablet Take 1 tablet (500 mg total) by mouth 3 (three) times daily. 21 tablet 0  . midodrine (PROAMATINE) 5 MG tablet  Take 5 mg by mouth 3 (three) times daily with meals.    . norethindrone-ethinyl estradiol-iron (MICROGESTIN FE,GILDESS FE,LOESTRIN FE) 1.5-30 MG-MCG tablet Take 1 tablet by mouth daily.    . ondansetron (ZOFRAN ODT) 8 MG disintegrating tablet Take 1 tablet (8 mg total) by mouth every 8 (eight) hours as needed for nausea or vomiting. (Patient not taking: Reported on 01/18/2016) 20 tablet 0  . Thyroid (NATURE-THROID) 48.75 MG TABS Take 1 tablet by mouth.    Marland Kitchen UNKNOWN TO PATIENT Birth control     No current facility-administered medications on file prior to visit.     BP 131/74 (BP Location: Right Arm, Patient Position: Sitting,  Cuff Size: Normal)   Pulse 78   Temp 99.1 F (37.3 C) (Oral)   Wt 247 lb 3.2 oz (112.1 kg)   LMP 12/16/2015   SpO2 99%   BMI 39.90 kg/m      Objective:   Physical Exam  General Appearance- Not in acute distress.  HEENT Eyes- Scleraeral/Conjuntiva-bilat- Not Yellow. Mouth & Throat- Normal.  Chest and Lung Exam Auscultation: Breath sounds:-Normal. Adventitious sounds:- No Adventitious sounds.  Cardiovascular Auscultation:Rythm - Regular. Heart Sounds -Normal heart sounds.  Abdomen Inspection:-Inspection Normal.  Palpation/Perucssion: Palpation and Percussion of the abdomen reveal- mild left lower quadrant region  Tender, No Rebound tenderness, No rigidity(Guarding) and No Palpable abdominal masses.  Liver:-Normal.  Spleen:- Normal.   Back- no rt cva tenderness. Very light faint left cva tenderness.       Assessment & Plan:  For your abdomen pain do want you to start the cipro and flagyl. Also I want you to call GI and establish the visit. Will get Anderson Malta to give you the number..  For your left ovary cyst area pain will contact our referral staff and check on status of the referral.  I do want to get cbc and cmp tomorrow.If pain worsening and GI appointment delayed may get repeat ct scan.   Will follow and repeat your urine in one week. Recheck for blood. If persist might consider urologist referral.(noet pt reports she had work up one year ago by urologist for some dysuria. She describes cystoscopy and other test which she states all were negative)  Follow up in 7 days or as needed  Dottie Vaquerano, Percell Miller, [employer]

## 2016-02-01 ENCOUNTER — Other Ambulatory Visit (INDEPENDENT_AMBULATORY_CARE_PROVIDER_SITE_OTHER): Payer: 59

## 2016-02-01 DIAGNOSIS — R109 Unspecified abdominal pain: Secondary | ICD-10-CM | POA: Diagnosis not present

## 2016-02-01 LAB — CBC WITH DIFFERENTIAL/PLATELET
BASOS ABS: 0 10*3/uL (ref 0.0–0.1)
Basophils Relative: 0.5 % (ref 0.0–3.0)
EOS ABS: 0.2 10*3/uL (ref 0.0–0.7)
Eosinophils Relative: 2 % (ref 0.0–5.0)
HEMATOCRIT: 37.2 % (ref 36.0–46.0)
HEMOGLOBIN: 12.3 g/dL (ref 12.0–15.0)
LYMPHS PCT: 26.3 % (ref 12.0–46.0)
Lymphs Abs: 2.7 10*3/uL (ref 0.7–4.0)
MCHC: 33.1 g/dL (ref 30.0–36.0)
MCV: 83.1 fl (ref 78.0–100.0)
MONOS PCT: 5.5 % (ref 3.0–12.0)
Monocytes Absolute: 0.6 10*3/uL (ref 0.1–1.0)
NEUTROS ABS: 6.8 10*3/uL (ref 1.4–7.7)
Neutrophils Relative %: 65.7 % (ref 43.0–77.0)
Platelets: 373 10*3/uL (ref 150.0–400.0)
RBC: 4.48 Mil/uL (ref 3.87–5.11)
RDW: 14.9 % (ref 11.5–15.5)
WBC: 10.4 10*3/uL (ref 4.0–10.5)

## 2016-02-01 LAB — COMPREHENSIVE METABOLIC PANEL
ALBUMIN: 4.1 g/dL (ref 3.5–5.2)
ALK PHOS: 34 U/L — AB (ref 39–117)
ALT: 19 U/L (ref 0–35)
AST: 19 U/L (ref 0–37)
BUN: 8 mg/dL (ref 6–23)
CO2: 30 mEq/L (ref 19–32)
CREATININE: 0.57 mg/dL (ref 0.40–1.20)
Calcium: 9.6 mg/dL (ref 8.4–10.5)
Chloride: 101 mEq/L (ref 96–112)
GFR: 123.97 mL/min (ref >60.00)
GLUCOSE: 88 mg/dL (ref 70–99)
Potassium: 4.6 mEq/L (ref 3.5–5.1)
SODIUM: 137 meq/L (ref 135–145)
TOTAL PROTEIN: 7.5 g/dL (ref 6.0–8.3)
Total Bilirubin: 0.3 mg/dL (ref 0.2–1.2)

## 2016-02-02 NOTE — Telephone Encounter (Signed)
Pt needs number to GI office. They called her to schedule appointment. She lost number to their office. Will you call her and give her the number. Also she needs referral to gyn. I put that appointment in. Will you see what delay is on gyn referral? Who did you refer to?

## 2016-02-04 NOTE — Telephone Encounter (Signed)
Called patient had to leave VM with [facility]'s phone number. [facility] for [facility]yn, I left that number as well

## 2016-02-08 ENCOUNTER — Ambulatory Visit (INDEPENDENT_AMBULATORY_CARE_PROVIDER_SITE_OTHER): Payer: 59 | Admitting: Gastroenterology

## 2016-02-08 ENCOUNTER — Encounter: Payer: Self-pay | Admitting: Gastroenterology

## 2016-02-08 ENCOUNTER — Encounter (INDEPENDENT_AMBULATORY_CARE_PROVIDER_SITE_OTHER): Payer: Self-pay

## 2016-02-08 VITALS — BP 108/80 | HR 80 | Ht 65.0 in | Wt 249.1 lb

## 2016-02-08 DIAGNOSIS — R112 Nausea with vomiting, unspecified: Secondary | ICD-10-CM

## 2016-02-08 DIAGNOSIS — K219 Gastro-esophageal reflux disease without esophagitis: Secondary | ICD-10-CM

## 2016-02-08 MED ORDER — RANITIDINE HCL 150 MG PO CAPS: 150.0000 mg | ORAL_CAPSULE | Freq: Every day | ORAL | 11 refills | Status: DC

## 2016-02-08 NOTE — Progress Notes (Signed)
HPI: This is a    pleasant 40 year old woman  who was referred to me by Esperanza RichtersSaguier, Edward, PA-C  to evaluate  intermittent vomiting, intermittent diarrhea, nausea .    Chief complaint is vomiting, intermittent nausea  A few weeks ago she was in ER with vomiting for 7-10 days.    This was 3 weeks ago. She's been able to eat past several days.  She still has lower abdominal pains after eating, even water.  She had this happen once before but it improved.  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  Symptoms from this are food sitting like a rock.    Not on antiacid medicine  She does take   Takes tums at night periodically; for near regurgitation.  Often when laying down.  Tums will often help.  Overall stable weight but up 60 pounds in 3 years.  Takes motrin every other day 2-3 pills.  Headaches.  Has never taken ppi or h2 blocker.  Has an  Ovarian cyst left side; symptomatic, painful.  CT scan last month showed ovarian cyst 3.3cm.    Review of systems: Pertinent positive and negative review of systems were noted in the above HPI section. Complete review of systems was performed and was otherwise normal.   Past Medical History:  Diagnosis Date  . Anemia    pernicious anemia. hx. as a child  . Anxiety   . Asthma 2014  . Cardiac arrhythmia   . Chronic headaches   . Gallstones   . GERD (gastroesophageal reflux disease)   . Hypothyroidism 01/17/2016  . Obesity   . Pneumonia   . Vasodepressor syncope     Past Surgical History:  Procedure Laterality Date  . CHOLECYSTECTOMY  2004  . OVARIAN CYST REMOVAL Left   . WISDOM TOOTH EXTRACTION      Current Outpatient Prescriptions  Medication Sig Dispense Refill  . albuterol (PROVENTIL) (2.5 MG/3ML) 0.083% nebulizer solution Take 2.5 mg by nebulization every 4 (four) hours as needed for wheezing or shortness of breath.    . ciprofloxacin (CIPRO) 500 MG tablet Take 1 tablet (500 mg  total) by mouth 2 (two) times daily. 14 tablet 0  . FLUoxetine (PROZAC) 20 MG tablet Take 20 mg by mouth daily.    . hyoscyamine (LEVBID) 0.375 MG 12 hr tablet Take 1 tablet (0.375 mg total) by mouth 2 (two) times daily. 30 tablet 0  . metroNIDAZOLE (FLAGYL) 500 MG tablet Take 1 tablet (500 mg total) by mouth 3 (three) times daily. 21 tablet 0  . midodrine (PROAMATINE) 5 MG tablet Take 5 mg by mouth 3 (three) times daily with meals.    . norethindrone-ethinyl estradiol-iron (MICROGESTIN FE,GILDESS FE,LOESTRIN FE) 1.5-30 MG-MCG tablet Take 1 tablet by mouth daily.    . Thyroid (NATURE-THROID) 48.75 MG TABS Take 1 tablet by mouth.    Marland Kitchen. UNKNOWN TO PATIENT Birth control     No current facility-administered medications for this visit.     Allergies as of 02/08/2016 - Review Complete 02/08/2016  Allergen Reaction Noted  . Aspirin Nausea And Vomiting 01/09/2016    Family History  Problem Relation Age of Onset  . Thyroid disease Mother   . Heart disease Father   . Stroke Father   . Heart attack Father   . Thyroid disease Sister   . Lung cancer Maternal Grandfather   . Heart disease Maternal Grandfather   . Diabetes Paternal Grandmother   . Thyroid cancer  Paternal Grandmother   . Heart disease Paternal Grandfather   . Breast cancer Paternal Aunt   . Bladder Cancer Paternal Aunt   . Thyroid disease Paternal Aunt   . Cancer Paternal Aunt     mouth and face     Social History   Social History  . Marital status: Single    Spouse name: N/A  . Number of children: 0  . Years of education: N/A   Occupational History  . scientist    Social History Main Topics  . Smoking status: Never Smoker  . Smokeless tobacco: Never Used  . Alcohol use Yes     Comment: Occasionally  . Drug use: No  . Sexual activity: Not on file   Other Topics Concern  . Not on file   Social History Narrative  . No narrative on file     Physical Exam: BP 108/80 (BP Location: Left Arm, Patient Position:  Sitting, Cuff Size: Normal)   Pulse 80   Ht 5\' 5"  (1.651 m) Comment: height measured without shoes  Wt 249 lb 2 oz (113 kg)   LMP 12/16/2015   BMI 41.46 kg/m  Constitutional: generally well-appearing Psychiatric: alert and oriented x3 Eyes: extraocular movements intact Mouth: oral pharynx moist, no lesions Neck: supple no lymphadenopathy Cardiovascular: heart regular rate and rhythm Lungs: clear to auscultation bilaterally Abdomen: soft, nontender, nondistended, no obvious ascites, no peritoneal signs, normal bowel sounds Extremities: no lower extremity edema bilaterally Skin: no lesions on visible extremities   Assessment and plan: 40 y.o. female with  intermittent nausea and vomiting  She tells me she has a hiatal hernia and I explained to her that that can predispose her to getting acid washing up on her esophagus. She has never taken proton pump inhibitors or H2 blockers on a regular basis. Since most of her symptoms are when she lays down a recommended she start taking ranitidine 150 mg pill every night at bedtime. She will return to see me in 2 months to update on her symptoms. She has no alarm symptoms and so I don't think there is any reason to proceed with further testing at this point. Her morbid obesity, BMI of 42 may be playing a role here in her GERD. Acid issues. Interestingly she believes chiropractic manipulation helps her hiatal hernia, pushes it down into her abdomen. I do not believe that that is physiologically possible.   Rob Buntinganiel Nelli Swalley, MD [facility] 02/08/2016, 3:37 PM  Cc: Esperanza RichtersSaguier, Edward, PA-C

## 2016-02-08 NOTE — Patient Instructions (Signed)
Ranitidine 150mg  take one pill at bedtime night. Please return to see Dr. Christella HartiganJacobs in 7-8 weeks to see if this helps.

## 2016-02-18 ENCOUNTER — Other Ambulatory Visit: Payer: Self-pay | Admitting: Medical

## 2016-02-18 DIAGNOSIS — E039 Hypothyroidism, unspecified: Secondary | ICD-10-CM

## 2016-02-18 NOTE — Telephone Encounter (Signed)
Please advise 

## 2016-02-18 NOTE — Telephone Encounter (Signed)
Relation to ZO:XWRUpt:self Call back [phone] Pharmacy: [id number] [place], [place] - [address] [phone] (Phone) [phone] (Fax)     Reason for call:  Patient states thyroid medication is on back order requesting an alternate, please advise patient sates she's completely out.

## 2016-02-19 MED ORDER — THYROID 48.75 MG PO TABS: 1.0000 | ORAL_TABLET | Freq: Once | ORAL | 11 refills | Status: DC

## 2016-02-19 NOTE — Telephone Encounter (Signed)
I sent her rx of nature-thyroid to our pharmacy. Will you coordinate with them on filling the script. Or if they don't have who would have?   Pt is requesting alternative since med is on back order. Will you ask Romeo AppleBen or Pam what is best equivalent. Or maybe call [facility] pharmacy [facility] what they would recommend.

## 2016-02-20 NOTE — Telephone Encounter (Signed)
I left a message for the patient that the Nature-Thyroid was on backorder at our pharmacy at this time. [facility] states its on back order until next year.   Please advise.

## 2016-02-21 NOTE — Telephone Encounter (Signed)
Future tsh and t4 order placed.

## 2016-02-21 NOTE — Telephone Encounter (Signed)
Pt pharmacy and [facility] can't get nature thyorid. It is back ordered. Per our pharmacy won't be available until next year. Maybe January. Maybe longer?   Our pharmacist recommended and I thought as well giving levothyroxine until then. But would want to get tsh and t4 level first before starting.   Did she try that in the past. Advise pt and let me know if she is ok with that plan.

## 2016-02-22 ENCOUNTER — Other Ambulatory Visit: Payer: Self-pay

## 2016-02-22 ENCOUNTER — Telehealth: Payer: Self-pay | Admitting: Medical

## 2016-02-22 NOTE — Telephone Encounter (Signed)
I spoke with the patient and advised her that per a verbal order from American FinancialEdward Saguier PA-C that she would need to come in for a lab appointment before he could send in a medication of Levothyroxine to the pharmacy while she is waiting on her Nature-Thyroid to be back on the shelves of pharmacies in [place].  Patient is aware and she will come in for lab work on 02/25/16.  The patient states that this puts her in a bind and she is in [place] and she will not be able to come in for labs until Monday morning. The appointment is set up for the patient and she is aware of her appointment.  FYI.

## 2016-02-22 NOTE — Telephone Encounter (Signed)
error:[id number] ° °

## 2016-02-22 NOTE — Telephone Encounter (Signed)
Called and left a message for call back  

## 2016-02-25 ENCOUNTER — Other Ambulatory Visit (INDEPENDENT_AMBULATORY_CARE_PROVIDER_SITE_OTHER): Payer: 59

## 2016-02-25 DIAGNOSIS — E039 Hypothyroidism, unspecified: Secondary | ICD-10-CM | POA: Diagnosis not present

## 2016-02-25 LAB — T4, FREE: Free T4: 0.65 ng/dL (ref 0.60–1.60)

## 2016-02-25 LAB — TSH: TSH: 2.65 u[IU]/mL (ref 0.35–4.50)

## 2016-02-25 MED ORDER — LEVOTHYROXINE SODIUM 50 MCG PO TABS: 50.0000 ug | ORAL_TABLET | Freq: Every day | ORAL | 0 refills | Status: DC

## 2016-02-25 NOTE — Telephone Encounter (Signed)
Have her repeat lab tsh and free t4 in about 28 days. Before she runs out of 30 days supply. Might increase the dose in one month.

## 2016-02-26 ENCOUNTER — Other Ambulatory Visit: Payer: Self-pay | Admitting: *Deleted

## 2016-02-26 DIAGNOSIS — E039 Hypothyroidism, unspecified: Secondary | ICD-10-CM

## 2016-02-26 MED ORDER — LEVOTHYROXINE SODIUM 50 MCG PO TABS: 50.0000 ug | ORAL_TABLET | Freq: Every day | ORAL | 0 refills | Status: DC

## 2016-02-26 NOTE — Telephone Encounter (Signed)
Pt notified of instructions and verbalized understanding. She wanted [facility]ine [facility] not [facility]. Medication re-sent to pharmacy as requested, rx canceled at [facility]. Lab appt scheduled and future orders placed.

## 2016-03-13 ENCOUNTER — Ambulatory Visit (INDEPENDENT_AMBULATORY_CARE_PROVIDER_SITE_OTHER): Payer: 59 | Admitting: Obstetrics & Gynecology

## 2016-03-13 ENCOUNTER — Encounter: Payer: Self-pay | Admitting: Obstetrics & Gynecology

## 2016-03-13 VITALS — BP 121/74 | HR 75 | Ht 66.0 in | Wt 246.0 lb

## 2016-03-13 DIAGNOSIS — N83202 Unspecified ovarian cyst, left side: Secondary | ICD-10-CM

## 2016-03-13 DIAGNOSIS — R102 Pelvic and perineal pain: Secondary | ICD-10-CM

## 2016-03-13 NOTE — Progress Notes (Signed)
History:  40 y.o. G0P0000 here today for eval of ovarian cyst. Pt was seen in the ED in Oct N/V.  A CT scan showed an ov cyst. Pt reports surgery on an ov cyst in 2010.  She reports that it returned in 2014. Her periods became irreg in 2015.  Pt reports that the cyst was followed with serial US and was 'stable' she is not aware of the size. She reports pain that is in the LLQ which makes it uncomfortable to sit. She fells like pressure and stretching.  The cyst was always on the left side.  Pt reports pain 2 weeks out of the month bit the pain is intermittent during that time.  Today its mild.  The pain is mid to mod intermittently.  Pt is still on OCPs'.  The pain is not related to when her bleeding occurs when on the pills.    Pt reports BM usually once every other day. She has intermittent constipation. She has recently seen a GI doc who reports that her cuyrrent pain was too low to be GI related.  Pt reports last PAP 1 year prev.  No h/o prior abnormal PAP.   Pt has never been sexually active.  Pt reports that there cycles are not well controlled with the OCPs. She will have spotting occ.  That started in Aug 2017 after she moved.  She related it to stress.  She is on   Current Outpatient Prescriptions on File Prior to Visit  Medication Sig Dispense Refill  . albuterol (PROVENTIL) (2.5 MG/3ML) 0.083% nebulizer solution Take 2.5 mg by nebulization every 4 (four) hours as needed for wheezing or shortness of breath.    . ciprofloxacin (CIPRO) 500 MG tablet Take 1 tablet (500 mg total) by mouth 2 (two) times daily. 14 tablet 0  . FLUoxetine (PROZAC) 20 MG tablet Take 20 mg by mouth daily.    . hyoscyamine (LEVBID) 0.375 MG 12 hr tablet Take 1 tablet (0.375 mg total) by mouth 2 (two) times daily. 30 tablet 0  . levothyroxine (SYNTHROID, LEVOTHROID) 50 MCG tablet Take 1 tablet (50 mcg total) by mouth daily. 30 tablet 0  . metroNIDAZOLE (FLAGYL) 500 MG tablet Take 1 tablet (500 mg total) by mouth 3  (three) times daily. 21 tablet 0  . midodrine (PROAMATINE) 5 MG tablet Take 5 mg by mouth 3 (three) times daily with meals.    . norethindrone-ethinyl estradiol-iron (MICROGESTIN FE,GILDESS FE,LOESTRIN FE) 1.5-30 MG-MCG tablet Take 1 tablet by mouth daily.    . ranitidine (ZANTAC) 150 MG capsule Take 1 capsule (150 mg total) by mouth at bedtime. 30 capsule 11  . Thyroid (NATURE-THROID) 48.75 MG TABS Take 1 tablet by mouth once. 30 tablet 11   No current facility-administered medications on file prior to visit.       The following portions of the patient's history were reviewed and updated as appropriate: allergies, current medications, past family history, past medical history, past social history, past surgical history and problem list.  Review of Systems:  Pertinent items are noted in HPI.   Objective:  Physical Exam Blood pressure 121/74, pulse 75, height 5\' 6"  (1.676 m), weight 246 lb (111.6 kg), last menstrual period 03/10/2016. BP 121/74 (BP Location: Left Arm, Patient Position: Sitting, Cuff Size: Large)   Pulse 75   Ht 5\' 6"  (1.676 m)   Wt 246 lb (111.6 kg)   LMP 03/10/2016   BMI 39.71 kg/m  CONSTITUTIONAL: Well-developed, well-nourished female in no acute  distress.  HENT:  Normocephalic, atraumatic EYES: Conjunctivae and EOM are normal. No scleral icterus.  NECK: Normal range of motion SKIN: Skin is warm and dry. No rash noted. Not diaphoretic.No pallor. Lungs: CTA CV: RRR NEUROLGIC: Alert and oriented to person, place, and time. Normal coordination.  Abd: obese, Soft, sl tender in the left lower quad but, nondistended Pelvic: Normal appearing external genitalia; Normal discharge.  Small uterus, no other palpable masses, no uterine tenderness. There is tenderness in the LLQ.   Labs and Imaging 01/09/2016 CLINICAL DATA:  Lower abdominal pain beginning today with headache, nausea, vomiting common diarrhea. History of cholecystectomy and ovarian cyst  removal.  EXAM: CT ABDOMEN AND PELVIS WITH CONTRAST  TECHNIQUE: Multidetector CT imaging of the abdomen and pelvis was performed using the standard protocol following bolus administration of intravenous contrast.  CONTRAST:  100mL ISOVUE-300 IOPAMIDOL (ISOVUE-300) INJECTION 61%  COMPARISON:  None.  FINDINGS: Lower chest: Clear lung bases.  Hepatobiliary: Diffusely decreased attenuation of the liver consistent with steatosis. No focal liver abnormality identified. Prior cholecystectomy. No biliary dilatation.  Pancreas: Unremarkable.  Spleen: Unremarkable.  Adrenals/Urinary Tract: Unremarkable adrenal glands. No evidence of renal mass, calculi, or hydronephrosis. No ureteral calculi or ureteral dilatation. Unremarkable bladder.  Stomach/Bowel: The stomach and is within normal limits. Oral contrast is present in multiple nondilated loops of small bowel without evidence of obstruction. There is left-sided colonic diverticulosis without evidence of diverticulitis. The appendix is not clearly identified, however no inflammatory changes are seen in the right lower quadrant.  Vascular/Lymphatic: No significant vascular findings are present. No enlarged abdominal or pelvic lymph nodes.  Reproductive: Uterus and right ovary are unremarkable. The left ovary is asymmetrically enlarged with multiple hypoattenuating foci suggestive of cysts measuring up to 3.3 cm.  Other: No intraperitoneal free fluid. No abdominal wall mass or hernia.  Musculoskeletal: Mild thoracolumbar spondylosis.  IMPRESSION: 1. No definite acute abnormality identified in the abdomen or pelvis. 2. Enlarged left ovary likely containing multiple cysts. Pelvic ultrasound could provide further characterization if there is clinical concern for a gynecologic cause of the patient's acute pain. 3. Hepatic steatosis.   Assessment & Plan:  AUB-  Pt reports that her cycles were normal prior to  her move to [place]. She ran out of her prev thyroid meds and just started Levothroid.   Rec waiting to see of her menses return to normal after she is regulated on her thyroid replacement.  Pelvic pain- reviewed with pt the DDX of pelvic pain and reviewed tha the pain is not necessarily due to the ov cyst noted on CT. Need to r/o other issues if the US is normal.     h/o left ov cyst. Cyst noted on CT.  Not definitive in size. Need to differentiate with an US.  Have explained to pt that this could be 2 follicles vs 1 cyst but the US will help with this eval.    Total face-to-face time with patient was 30 min.  Greater than 50% was spent in counseling and coordination of care with the patient. We discussed- see above.     Records from [facility]- Dr Thayer HeadingsJill Foley, [place], [place]  [place]levelandarolyn L. Harraway-Smith, M.D., [facility]

## 2016-03-13 NOTE — Patient Instructions (Signed)
Pelvic Pain, Female  Female pelvic pain can be caused by many different things and start from a variety of places. Pelvic pain refers to pain that is located in the lower half of the abdomen and between your hips. The pain may occur over a short period of time (acute) or may be reoccurring (chronic). The cause of pelvic pain may be related to disorders affecting the female reproductive organs (gynecologic), but it may also be related to the bladder, kidney stones, an intestinal complication, or muscle or skeletal problems. Getting help right away for pelvic pain is important, especially if there has been severe, sharp, or a sudden onset of unusual pain. It is also important to get help right away because some types of pelvic pain can be life threatening.   CAUSES   Below are only some of the causes of pelvic pain. The causes of pelvic pain can be in one of several categories.    Gynecologic.    Pelvic inflammatory disease.    Sexually transmitted infection.    Ovarian cyst or a twisted ovarian ligament (ovarian torsion).    Uterine lining that grows outside the uterus (endometriosis).    Fibroids, cysts, or tumors.    Ovulation.   Pregnancy.    Pregnancy that occurs outside the uterus (ectopic pregnancy).    Miscarriage.    Labor.    Abruption of the placenta or ruptured uterus.   Infection.    Uterine infection (endometritis).    Bladder infection.    Diverticulitis.    Miscarriage related to a uterine infection (septic abortion).   Bladder.    Inflammation of the bladder (cystitis).    Kidney stone(s).   Gastrointestinal.    Constipation.    Diverticulitis.   Neurologic.    Trauma.    Feeling pelvic pain because of mental or emotional causes (psychosomatic).   Cancers of the bowel or pelvis.  EVALUATION   Your caregiver will want to take a careful history of your concerns. This includes recent changes in your health, a careful gynecologic history of your periods (menses), and a sexual history. Obtaining  your family history and medical history is also important. Your caregiver may suggest a pelvic exam. A pelvic exam will help identify the location and severity of the pain. It also helps in the evaluation of which organ system may be involved. In order to identify the cause of the pelvic pain and be properly treated, your caregiver may order tests. These tests may include:    A pregnancy test.   Pelvic ultrasonography.   An X-ray exam of the abdomen.   A urinalysis or evaluation of vaginal discharge.   Blood tests.  HOME CARE INSTRUCTIONS    Only take over-the-counter or prescription medicines for pain, discomfort, or fever as directed by your caregiver.    Rest as directed by your caregiver.    Eat a balanced diet.    Drink enough fluids to make your urine clear or pale yellow, or as directed.    Avoid sexual intercourse if it causes pain.    Apply warm or cold compresses to the lower abdomen depending on which one helps the pain.    Avoid stressful situations.    Keep a journal of your pelvic pain. Write down when it started, where the pain is located, and if there are things that seem to be associated with the pain, such as food or your menstrual cycle.   Follow up with your caregiver as directed.     SEEK MEDICAL CARE IF:   Your medicine does not help your pain.   You have abnormal vaginal discharge.  SEEK IMMEDIATE MEDICAL CARE IF:    You have heavy bleeding from the vagina.    Your pelvic pain increases.    You feel light-headed or faint.    You have chills.    You have pain with urination or blood in your urine.    You have uncontrolled diarrhea or vomiting.    You have a fever or persistent symptoms for more than 3 days.   You have a fever and your symptoms suddenly get worse.    You are being physically or sexually abused.     This information is not intended to replace advice given to you by your health care provider. Make sure you discuss any questions you have with  your health care provider.     Document Released: 02/19/2004 Document Revised: 12/13/2014 Document Reviewed: 01/12/2015  Elsevier Interactive Patient Education 2017 [employer] Inc[employer]

## 2016-03-15 ENCOUNTER — Ambulatory Visit (HOSPITAL_BASED_OUTPATIENT_CLINIC_OR_DEPARTMENT_OTHER): Payer: 59

## 2016-03-15 ENCOUNTER — Ambulatory Visit (HOSPITAL_BASED_OUTPATIENT_CLINIC_OR_DEPARTMENT_OTHER): Admission: RE | Admit: 2016-03-15 | Payer: 59 | Source: Ambulatory Visit

## 2016-03-18 ENCOUNTER — Ambulatory Visit (HOSPITAL_BASED_OUTPATIENT_CLINIC_OR_DEPARTMENT_OTHER): Payer: 59

## 2016-03-18 ENCOUNTER — Ambulatory Visit (HOSPITAL_BASED_OUTPATIENT_CLINIC_OR_DEPARTMENT_OTHER)
Admission: RE | Admit: 2016-03-18 | Discharge: 2016-03-18 | Disposition: A | Discharge status: Home / Self Care | Payer: 59 | Source: Ambulatory Visit | Attending: Family Medicine | Admitting: Family Medicine

## 2016-03-18 ENCOUNTER — Ambulatory Visit (HOSPITAL_BASED_OUTPATIENT_CLINIC_OR_DEPARTMENT_OTHER)
Admission: RE | Admit: 2016-03-18 | Discharge: 2016-03-18 | Disposition: A | Discharge status: Home / Self Care | Payer: 59 | Source: Ambulatory Visit | Attending: Obstetrics & Gynecology | Admitting: Obstetrics & Gynecology

## 2016-03-18 DIAGNOSIS — Z1239 Encounter for other screening for malignant neoplasm of breast: Secondary | ICD-10-CM

## 2016-03-18 DIAGNOSIS — N83202 Unspecified ovarian cyst, left side: Secondary | ICD-10-CM

## 2016-03-18 DIAGNOSIS — R102 Pelvic and perineal pain: Secondary | ICD-10-CM | POA: Insufficient documentation

## 2016-03-18 DIAGNOSIS — Z1231 Encounter for screening mammogram for malignant neoplasm of breast: Secondary | ICD-10-CM | POA: Diagnosis not present

## 2016-03-24 ENCOUNTER — Other Ambulatory Visit (INDEPENDENT_AMBULATORY_CARE_PROVIDER_SITE_OTHER): Payer: 59

## 2016-03-24 ENCOUNTER — Telehealth: Payer: Self-pay | Admitting: Medical

## 2016-03-24 DIAGNOSIS — E039 Hypothyroidism, unspecified: Secondary | ICD-10-CM | POA: Diagnosis not present

## 2016-03-24 LAB — TSH: TSH: 2.77 u[IU]/mL (ref 0.35–4.50)

## 2016-03-24 LAB — T4, FREE: FREE T4: 0.82 ng/dL (ref 0.60–1.60)

## 2016-03-24 MED ORDER — LEVOTHYROXINE SODIUM 50 MCG PO TABS: 50.0000 ug | ORAL_TABLET | Freq: Every day | ORAL | 2 refills | Status: DC

## 2016-03-24 NOTE — Telephone Encounter (Signed)
rx levothyroxine sent in to her pharmacy.

## 2016-03-25 ENCOUNTER — Other Ambulatory Visit: Payer: Self-pay | Admitting: Medical

## 2016-04-02 ENCOUNTER — Other Ambulatory Visit: Payer: Self-pay | Admitting: Medical

## 2016-04-08 NOTE — Progress Notes (Signed)
Left message x1 Armandina StammerJennifer Tylia Ewell RN BSN

## 2016-04-09 ENCOUNTER — Telehealth: Payer: Self-pay

## 2016-04-09 NOTE — Telephone Encounter (Signed)
Left message for patient to return call to office.  Leafy Motsinger RNBSN 

## 2016-04-09 NOTE — Telephone Encounter (Signed)
-----   Message from Willodean Rosenthalarolyn Harraway-Smith, MD sent at 04/04/2016 12:05 AM EST ----- Please call pt. She has a benign appearing ov cyst. She needs a repeat US in 4 months.  Thx,  clh-S

## 2016-04-10 ENCOUNTER — Telehealth: Payer: Self-pay | Admitting: Medical

## 2016-04-10 NOTE — Telephone Encounter (Signed)
Patient returned call to office and made her aware of benign appearing cyst. Patient made aware that she will need a repeat ultrasound in four months. Patient concerned since her insurance doesn't pay for the ultrasound. Patient states she though her last ultrasound showed that her cyst was 1cm. Patient then asked if her records where in from [place]. Assured patient we have sent off the release but have not received anything from them. Armandina StammerJennifer Howard RNBSN

## 2016-04-10 NOTE — Telephone Encounter (Signed)
Pt would like to know if there is a reason that she only received a 30 day supply of her levothyroxine?    CB: [phone]

## 2016-04-11 MED ORDER — LEVOTHYROXINE SODIUM 50 MCG PO TABS: 50.0000 ug | ORAL_TABLET | Freq: Every day | ORAL | 0 refills | Status: DC

## 2016-04-11 NOTE — Telephone Encounter (Deleted)
Status:  Final result Visible to patient:  No (Not Released) Next appt:  None Dx:  Hypothyroidism, unspecified type  Notes Recorded by Starla Linkarolyn J Oakley, RN on 02/26/2016 at 1:00 PM EST Pt notified of results and verbalized understanding. Lab appt scheduled and future orders placed. ------  Notes Recorded by Starla Linkarolyn J Oakley, RN on 02/26/2016 at 11:35 AM EST Called patient and left message to return call. ------  Notes Recorded by Esperanza RichtersEdward Saguier, PA-C on 02/25/2016 at 8:21 PM EST Pt had tsh of 2.65 and t4 of 0.65. Pt t4 lower end of normal. Will rx low dose of levothyroxin. Would recommend repeating tsh and free t4 in one month.  Newer results are available. Click to view them now.    Ref Range & Units 40mo ago 63mo ago   Free T4 0.60 - 1.60 ng/dL 1.610.65  0.96EA0.63CM

## 2016-04-11 NOTE — Telephone Encounter (Signed)
Rx request to pharmacy/[facility]S  

## 2016-05-04 ENCOUNTER — Other Ambulatory Visit: Payer: Self-pay | Admitting: Medical

## 2016-05-06 ENCOUNTER — Ambulatory Visit (INDEPENDENT_AMBULATORY_CARE_PROVIDER_SITE_OTHER): Payer: 59 | Admitting: Family Medicine

## 2016-05-06 ENCOUNTER — Encounter: Payer: Self-pay | Admitting: Family Medicine

## 2016-05-06 VITALS — BP 122/85 | HR 85 | Temp 98.5°F | Resp 20 | Wt 248.2 lb

## 2016-05-06 DIAGNOSIS — R112 Nausea with vomiting, unspecified: Secondary | ICD-10-CM | POA: Diagnosis not present

## 2016-05-06 DIAGNOSIS — J0100 Acute maxillary sinusitis, unspecified: Secondary | ICD-10-CM | POA: Diagnosis not present

## 2016-05-06 DIAGNOSIS — R6889 Other general symptoms and signs: Secondary | ICD-10-CM

## 2016-05-06 LAB — POC INFLUENZA A&B (BINAX/QUICKVUE)
INFLUENZA B, POC: NEGATIVE
Influenza A, POC: NEGATIVE

## 2016-05-06 MED ORDER — ALBUTEROL SULFATE HFA 108 (90 BASE) MCG/ACT IN AERS: 2.0000 | INHALATION_SPRAY | Freq: Four times a day (QID) | RESPIRATORY_TRACT | 0 refills | Status: DC | PRN

## 2016-05-06 MED ORDER — AMOXICILLIN-POT CLAVULANATE 875-125 MG PO TABS: 1.0000 | ORAL_TABLET | Freq: Two times a day (BID) | ORAL | 0 refills | Status: DC

## 2016-05-06 MED ORDER — ONDANSETRON HCL 4 MG PO TABS: 4.0000 mg | ORAL_TABLET | Freq: Three times a day (TID) | ORAL | 0 refills | Status: DC | PRN

## 2016-05-06 NOTE — Patient Instructions (Addendum)
I have called in albuterol inhaler for you to use every 6 hours 1-2 puffs as need during illness for shortness of breath or wheezing. Zofran prescribed for nausea.  Augmentin prescribed for sinus infection.  Your Flu swab was negative.  Use mucinex DM for cough and secretions. Tylenol/advil for headache/body aches.  Rest and hydrate.     Sinusitis, Adult Sinusitis is soreness and inflammation of your sinuses. Sinuses are hollow spaces in the bones around your face. They are located:  Around your eyes.  In the middle of your forehead.  Behind your nose.  In your cheekbones. Your sinuses and nasal passages are lined with a stringy fluid (mucus). Mucus normally drains out of your sinuses. When your nasal tissues get inflamed or swollen, the mucus can get trapped or blocked so air cannot flow through your sinuses. This lets bacteria, viruses, and funguses grow, and that leads to infection. Follow these instructions at home: Medicines  Take, use, or apply over-the-counter and prescription medicines only as told by your doctor. These may include nasal sprays.  If you were prescribed an antibiotic medicine, take it as told by your doctor. Do not stop taking the antibiotic even if you start to feel better. Hydrate and Humidify  Drink enough water to keep your pee (urine) clear or pale yellow.  Use a cool mist humidifier to keep the humidity level in your home above 50%.  Breathe in steam for 10-15 minutes, 3-4 times a day or as told by your doctor. You can do this in the bathroom while a hot shower is running.  Try not to spend time in cool or dry air. Rest  Rest as much as possible.  Sleep with your head raised (elevated).  Make sure to get enough sleep each night. General instructions  Put a warm, moist washcloth on your face 3-4 times a day or as told by your doctor. This will help with discomfort.  Wash your hands often with soap and water. If there is no soap and water, use  hand sanitizer.  Do not smoke. Avoid being around people who are smoking (secondhand smoke).  Keep all follow-up visits as told by your doctor. This is important. Contact a doctor if:  You have a fever.  Your symptoms get worse.  Your symptoms do not get better within 10 days. Get help right away if:  You have a very bad headache.  You cannot stop throwing up (vomiting).  You have pain or swelling around your face or eyes.  You have trouble seeing.  You feel confused.  Your neck is stiff.  You have trouble breathing. This information is not intended to replace advice given to you by your health care provider. Make sure you discuss any questions you have with your health care provider. Document Released: 09/10/2007 Document Revised: 11/18/2015 Document Reviewed: 01/17/2015 Elsevier Interactive Patient Education  2017 [employer]

## 2016-05-06 NOTE — Progress Notes (Signed)
Debra West , [birth year], 41 y.o., female MRN: [id number] Patient Care Team    Relationship Specialty Notifications Start End  Esperanza Richters, [place] PCP - General Internal Medicine  02/08/16     CC: flu-like symptoms.  Subjective: Pt presents for an acute OV with complaints of flu-like sx of 3 days duration.  Associated symptoms include nasal and chest congestion, intermittent productive cough, body aches, headache, chills, sore throat, nausea, vomit, diarrhea. She has tried Ibuprofen/tylenol to ease their symptoms, last dose this morning. She states she needs a refill on her albuterol inhaler she uses when she is sick only.   Allergies  Allergen Reactions  . Aspirin Nausea And Vomiting   Social History  Substance Use Topics  . Smoking status: Never Smoker  . Smokeless tobacco: Never Used  . Alcohol use Yes     Comment: Occasionally   Past Medical History:  Diagnosis Date  . Anemia    pernicious anemia. hx. as a child  . Anxiety   . Asthma 2014  . Cardiac arrhythmia   . Chronic headaches   . Gallstones   . GERD (gastroesophageal reflux disease)   . Hypothyroidism 01/17/2016  . Obesity   . Pneumonia   . Vasodepressor syncope    Past Surgical History:  Procedure Laterality Date  . CHOLECYSTECTOMY  2004  . OVARIAN CYST REMOVAL Left   . WISDOM TOOTH EXTRACTION     Family History  Problem Relation Age of Onset  . Thyroid disease Mother   . Heart disease Father   . Stroke Father   . Heart attack Father   . Thyroid disease Sister   . Lung cancer Maternal Grandfather   . Heart disease Maternal Grandfather   . Diabetes Paternal Grandmother   . Thyroid cancer Paternal Grandmother   . Heart disease Paternal Grandfather   . Breast cancer Paternal Aunt   . Bladder Cancer Paternal Aunt   . Thyroid disease Paternal Aunt   . Cancer Paternal Aunt     mouth and face    Allergies as of 05/06/2016      Reactions   Aspirin Nausea And Vomiting      Medication List       Accurate as of 05/06/16 10:17 AM. Always use your most recent med list.          albuterol 108 (90 Base) MCG/ACT inhaler Commonly known as:  PROVENTIL HFA;VENTOLIN HFA Inhale 2 puffs into the lungs every 6 (six) hours as needed for wheezing or shortness of breath.   amoxicillin-clavulanate 875-125 MG tablet Commonly known as:  AUGMENTIN Take 1 tablet by mouth 2 (two) times daily.   FLUoxetine 20 MG tablet Commonly known as:  PROZAC Take 20 mg by mouth daily.   hyoscyamine 0.375 MG 12 hr tablet Commonly known as:  LEVBID Take 1 tablet (0.375 mg total) by mouth 2 (two) times daily.   levothyroxine 50 MCG tablet Commonly known as:  SYNTHROID, LEVOTHROID Take 1 tablet (50 mcg total) by mouth daily.   midodrine 5 MG tablet Commonly known as:  PROAMATINE Take 5 mg by mouth 3 (three) times daily with meals.   norethindrone-ethinyl estradiol-iron 1.5-30 MG-MCG tablet Commonly known as:  MICROGESTIN FE,GILDESS FE,LOESTRIN FE Take 1 tablet by mouth daily.   ondansetron 4 MG tablet Commonly known as:  ZOFRAN Take 1 tablet (4 mg total) by mouth every 8 (eight) hours as needed for nausea or vomiting.   ranitidine 150 MG capsule Commonly known as:  ZANTAC  Take 1 capsule (150 mg total) by mouth at bedtime.       Results for orders placed or performed in visit on 05/06/16 (from the past 24 hour(s))  POC Influenza A&B (Binax test)     Status: Normal   Collection Time: 05/06/16 10:16 AM  Result Value Ref Range   Influenza A, POC Negative Negative   Influenza B, POC Negative Negative   No results found.   ROS: Negative, with the exception of above mentioned in HPI   Objective:  BP 122/85 (BP Location: Left Arm, Patient Position: Sitting, Cuff Size: Large)   Pulse 85   Temp 98.5 F (36.9 C)   Resp 20   Wt 248 lb 4 oz (112.6 kg)   SpO2 98%   BMI 40.07 kg/m  Body mass index is 40.07 kg/m. Gen: Afebrile. No acute distress. Nontoxic in appearance, well developed, well  nourished.  HENT: AT. [place]. Bilateral TM visualized bilateral air fluid levles. MMM, no oral lesions. Bilateral nares erythema, dranage. Throat without erythema or exudates. Cough, hoarseness, TTP max sinus.  Eyes:Pupils Equal Round Reactive to light, Extraocular movements intact,  Conjunctiva without redness, discharge or icterus. Neck/lymp/endocrine: Supple,no lymphadenopathy CV: RRR Chest: CTAB, no wheeze or crackles. Good air movement, normal resp effort.  Abd: Soft. NTND. BS present.   Results for orders placed or performed in visit on 05/06/16 (from the past 24 hour(s))  POC Influenza A&B (Binax test)     Status: Normal   Collection Time: 05/06/16 10:16 AM  Result Value Ref Range   Influenza A, POC Negative Negative   Influenza B, POC Negative Negative     Assessment/Plan: Debra West is a 41 y.o. female present for acute OV for  Flu-like symptoms - POC Influenza A&B (Binax test): negative Acute maxillary sinusitis, recurrence not specified - rest, hydrate. Mucinex. Tylenol/advil.  - albuterol (PROVENTIL HFA;VENTOLIN HFA) 108 (90 Base) MCG/ACT inhaler; Inhale 2 puffs into the lungs every 6 (six) hours as needed for wheezing or shortness of breath.  Dispense: 1 Inhaler; Refill: 0 - ondansetron (ZOFRAN) 4 MG tablet; Take 1 tablet (4 mg total) by mouth every 8 (eight) hours as needed for nausea or vomiting.  Dispense: 20 tablet; Refill: 0 - amoxicillin-clavulanate (AUGMENTIN) 875-125 MG tablet; Take 1 tablet by mouth 2 (two) times daily.  Dispense: 20 tablet; Refill: 0 Nausea and vomiting, intractability of vomiting not specified, unspecified vomiting type - ondansetron (ZOFRAN) 4 MG tablet; Take 1 tablet (4 mg total) by mouth every 8 (eight) hours as needed for nausea or vomiting.  Dispense: 20 tablet; Refill: 0  F/U 1-2 weeks with PCP if symptoms not improved.  electronically signed by:  Felix Pacinienee Kuneff, DO  [facility] [place]

## 2016-05-06 NOTE — Telephone Encounter (Signed)
Pt is wanting to know why is she only getting 30 day supply of levothyroxine (SYNTHROID, LEVOTHROID) 50 MCG tablet, pt is asking if possible to get another refill since she is going to be out of meds in a few days and if possible for more days (3 month) to be refill.   Tel [phone].  Pharmacy  [facility] [phone] - [place], [place] - [address]

## 2016-05-06 NOTE — Telephone Encounter (Signed)
Patient was given a 90-day supply to her pharmacy on 04/11/16, please advise patient to call pharmacy/SLS 01/30  Medication Detail    Disp Refills Start End   levothyroxine (SYNTHROID, LEVOTHROID) 50 MCG tablet 90 tablet 0 04/11/2016    Sig - Route: Take 1 tablet (50 mcg total) by mouth daily. - Oral   E-Prescribing Status: Receipt confirmed by pharmacy (04/11/2016 4:09 PM EST)   Pharmacy   [id number] - [place], [place] - [address]

## 2016-05-06 NOTE — Telephone Encounter (Signed)
LVM for pt about her rx and to call back.

## 2016-05-10 ENCOUNTER — Emergency Department (HOSPITAL_BASED_OUTPATIENT_CLINIC_OR_DEPARTMENT_OTHER)
Admission: EM | Admit: 2016-05-10 | Discharge: 2016-05-10 | Disposition: A | Discharge status: Home / Self Care | Payer: 59 | Attending: Emergency Medicine | Admitting: Emergency Medicine

## 2016-05-10 ENCOUNTER — Emergency Department (HOSPITAL_BASED_OUTPATIENT_CLINIC_OR_DEPARTMENT_OTHER): Payer: 59

## 2016-05-10 ENCOUNTER — Encounter (HOSPITAL_BASED_OUTPATIENT_CLINIC_OR_DEPARTMENT_OTHER): Payer: Self-pay | Admitting: Emergency Medicine

## 2016-05-10 DIAGNOSIS — J45901 Unspecified asthma with (acute) exacerbation: Secondary | ICD-10-CM | POA: Insufficient documentation

## 2016-05-10 DIAGNOSIS — R062 Wheezing: Secondary | ICD-10-CM | POA: Diagnosis present

## 2016-05-10 DIAGNOSIS — Z79899 Other long term (current) drug therapy: Secondary | ICD-10-CM | POA: Diagnosis not present

## 2016-05-10 DIAGNOSIS — E039 Hypothyroidism, unspecified: Secondary | ICD-10-CM | POA: Insufficient documentation

## 2016-05-10 LAB — CBC WITH DIFFERENTIAL/PLATELET
Basophils Absolute: 0 10*3/uL (ref 0.0–0.1)
Basophils Relative: 0 %
EOS PCT: 4 %
Eosinophils Absolute: 0.2 10*3/uL (ref 0.0–0.7)
HCT: 41.4 % (ref 36.0–46.0)
Hemoglobin: 13.2 g/dL (ref 12.0–15.0)
LYMPHS ABS: 2.8 10*3/uL (ref 0.7–4.0)
Lymphocytes Relative: 49 %
MCH: 27.3 pg (ref 26.0–34.0)
MCHC: 31.9 g/dL (ref 30.0–36.0)
MCV: 85.7 fL (ref 78.0–100.0)
MONOS PCT: 13 %
Monocytes Absolute: 0.7 10*3/uL (ref 0.1–1.0)
Neutro Abs: 1.9 10*3/uL (ref 1.7–7.7)
Neutrophils Relative %: 34 %
PLATELETS: 302 10*3/uL (ref 150–400)
RBC: 4.83 MIL/uL (ref 3.87–5.11)
RDW: 14 % (ref 11.5–15.5)
WBC: 5.7 10*3/uL (ref 4.0–10.5)

## 2016-05-10 LAB — BASIC METABOLIC PANEL
Anion gap: 9 (ref 5–15)
BUN: 8 mg/dL (ref 6–20)
CHLORIDE: 99 mmol/L — AB (ref 101–111)
CO2: 27 mmol/L (ref 22–32)
CREATININE: 0.49 mg/dL (ref 0.44–1.00)
Calcium: 8.8 mg/dL — ABNORMAL LOW (ref 8.9–10.3)
GFR calc Af Amer: >60 mL/min (ref >60)
GFR calc non Af Amer: >60 mL/min (ref >60)
GLUCOSE: 116 mg/dL — AB (ref 65–99)
POTASSIUM: 3.6 mmol/L (ref 3.5–5.1)
Sodium: 135 mmol/L (ref 135–145)

## 2016-05-10 MED ORDER — METHYLPREDNISOLONE SODIUM SUCC 125 MG IJ SOLR
125.0000 mg | Freq: Once | INTRAMUSCULAR | Status: AC
  Administered 2016-05-10: 125 mg via INTRAVENOUS
  Filled 2016-05-10: qty 2

## 2016-05-10 MED ORDER — ALBUTEROL SULFATE (2.5 MG/3ML) 0.083% IN NEBU
5.0000 mg | INHALATION_SOLUTION | Freq: Once | RESPIRATORY_TRACT | Status: AC
  Administered 2016-05-10: 5 mg via RESPIRATORY_TRACT
  Filled 2016-05-10: qty 6

## 2016-05-10 MED ORDER — ALBUTEROL SULFATE (2.5 MG/3ML) 0.083% IN NEBU
2.5000 mg | INHALATION_SOLUTION | Freq: Once | RESPIRATORY_TRACT | Status: AC
  Administered 2016-05-10: 2.5 mg via RESPIRATORY_TRACT
  Filled 2016-05-10: qty 3

## 2016-05-10 MED ORDER — ALBUTEROL (5 MG/ML) CONTINUOUS INHALATION SOLN
10.0000 mg/h | INHALATION_SOLUTION | Freq: Once | RESPIRATORY_TRACT | Status: AC
  Administered 2016-05-10: 10 mg/h via RESPIRATORY_TRACT
  Filled 2016-05-10: qty 20

## 2016-05-10 MED ORDER — ALBUTEROL SULFATE (2.5 MG/3ML) 0.083% IN NEBU
2.5000 mg | INHALATION_SOLUTION | Freq: Once | RESPIRATORY_TRACT | Status: AC
  Administered 2016-05-10: 2.5 mg via RESPIRATORY_TRACT

## 2016-05-10 MED ORDER — ALBUTEROL SULFATE (2.5 MG/3ML) 0.083% IN NEBU
INHALATION_SOLUTION | RESPIRATORY_TRACT | Status: AC
Start: 2016-05-10 — End: 2016-05-10
  Administered 2016-05-10: 2.5 mg via RESPIRATORY_TRACT
  Filled 2016-05-10: qty 3

## 2016-05-10 MED ORDER — IPRATROPIUM-ALBUTEROL 0.5-2.5 (3) MG/3ML IN SOLN
RESPIRATORY_TRACT | Status: AC
  Administered 2016-05-10: 3 mL via RESPIRATORY_TRACT
  Filled 2016-05-10: qty 3

## 2016-05-10 MED ORDER — IPRATROPIUM-ALBUTEROL 0.5-2.5 (3) MG/3ML IN SOLN
3.0000 mL | Freq: Once | RESPIRATORY_TRACT | Status: AC
  Administered 2016-05-10: 3 mL via RESPIRATORY_TRACT

## 2016-05-10 MED ORDER — IPRATROPIUM BROMIDE 0.02 % IN SOLN
0.5000 mg | Freq: Once | RESPIRATORY_TRACT | Status: AC
  Administered 2016-05-10: 0.5 mg via RESPIRATORY_TRACT
  Filled 2016-05-10: qty 2.5

## 2016-05-10 MED ORDER — PREDNISONE 20 MG PO TABS: 40.0000 mg | ORAL_TABLET | Freq: Every day | ORAL | 0 refills | Status: DC

## 2016-05-10 MED ORDER — MAGNESIUM SULFATE 2 GM/50ML IV SOLN
2.0000 g | Freq: Once | INTRAVENOUS | Status: AC
  Administered 2016-05-10: 2 g via INTRAVENOUS
  Filled 2016-05-10: qty 50

## 2016-05-10 NOTE — ED Notes (Signed)
Back from b/r, steady gait, reports 7/10 HA and sob, states, "feel a little better", frequent cough noted, (denies: nausea, dizziness, or other pain), returned to monitor, VSS, tachy after recent neb.

## 2016-05-10 NOTE — ED Provider Notes (Signed)
[facility] Provider Note   CSN: [id number] Arrival date & time: 05/10/16  1642  By signing my name below, I, Talbert NanPaul Grant, attest that this documentation has been prepared under the direction and in the presence of Josh Audi Conover PA-C. Electronically Signed: Talbert NanPaul Grant, Scribe. 05/10/16. 8:19 PM.   History   Chief Complaint Chief Complaint  Patient presents with  . Cough    HPI Debra West is a 41 y.o. female with hx of respiratory infections who presents to the Emergency Department complaining of gradual onset moderate persistent shortness of breath that started 7 days ago. She has associated congestion, cough, chest tightness, and fever of 102. Pt started using inhaler. Pt started Augmentin 7 days ago. Pt has been admitted to the hospital for respiratory issues February 2014.  The history is provided by the patient. No language interpreter was used.    Past Medical History:  Diagnosis Date  . Anemia    pernicious anemia. hx. as a child  . Anxiety   . Asthma 2014  . Cardiac arrhythmia   . Chronic headaches   . Gallstones   . GERD (gastroesophageal reflux disease)   . Hypothyroidism 01/17/2016  . Obesity   . Pneumonia   . Vasodepressor syncope     Patient Active Problem List   Diagnosis Date Noted  . Hypothyroidism 01/17/2016    Past Surgical History:  Procedure Laterality Date  . CHOLECYSTECTOMY  2004  . OVARIAN CYST REMOVAL Left   . WISDOM TOOTH EXTRACTION      OB History    Gravida Para Term Preterm AB Living   0 0 0 0 0 0   SAB TAB Ectopic Multiple Live Births   0 0 0 0 0       Home Medications    Prior to Admission medications   Medication Sig Start Date End Date Taking? Authorizing Provider  albuterol (PROVENTIL HFA;VENTOLIN HFA) 108 (90 Base) MCG/ACT inhaler Inhale 2 puffs into the lungs every 6 (six) hours as needed for wheezing or shortness of breath. 05/06/16   Renee A Kuneff, DO  amoxicillin-clavulanate (AUGMENTIN) 875-125 MG tablet  Take 1 tablet by mouth 2 (two) times daily. 05/06/16   Renee A Kuneff, DO  FLUoxetine (PROZAC) 20 MG tablet Take 20 mg by mouth daily.    Historical Provider, MD  hyoscyamine (LEVBID) 0.375 MG 12 hr tablet Take 1 tablet (0.375 mg total) by mouth 2 (two) times daily. Patient not taking: Reported on 05/06/2016 01/18/16   Esperanza RichtersEdward Saguier, PA-C  levothyroxine (SYNTHROID, LEVOTHROID) 50 MCG tablet Take 1 tablet (50 mcg total) by mouth daily. 04/11/16   Ramon DredgeEdward Saguier, PA-C  midodrine (PROAMATINE) 5 MG tablet Take 5 mg by mouth 3 (three) times daily with meals.    Historical Provider, MD  norethindrone-ethinyl estradiol-iron (MICROGESTIN FE,GILDESS FE,LOESTRIN FE) 1.5-30 MG-MCG tablet Take 1 tablet by mouth daily.    Historical Provider, MD  ondansetron (ZOFRAN) 4 MG tablet Take 1 tablet (4 mg total) by mouth every 8 (eight) hours as needed for nausea or vomiting. 05/06/16   Renee A Kuneff, DO  ranitidine (ZANTAC) 150 MG capsule Take 1 capsule (150 mg total) by mouth at bedtime. Patient not taking: Reported on 05/06/2016 02/08/16   Rachael Feeaniel P Jacobs, MD    Family History Family History  Problem Relation Age of Onset  . Thyroid disease Mother   . Heart disease Father   . Stroke Father   . Heart attack Father   . Thyroid disease Sister   .  Lung cancer Maternal Grandfather   . Heart disease Maternal Grandfather   . Diabetes Paternal Grandmother   . Thyroid cancer Paternal Grandmother   . Heart disease Paternal Grandfather   . Breast cancer Paternal Aunt   . Bladder Cancer Paternal Aunt   . Thyroid disease Paternal Aunt   . Cancer Paternal Aunt     mouth and face     Social History Social History  Substance Use Topics  . Smoking status: Never Smoker  . Smokeless tobacco: Never Used  . Alcohol use Yes     Comment: Occasionally     Allergies   Aspirin   Review of Systems Review of Systems  Constitutional: Positive for fever.  HENT: Positive for congestion. Negative for rhinorrhea and sore  throat.   Eyes: Negative for redness.  Respiratory: Positive for cough and chest tightness.   Cardiovascular: Negative for chest pain.  Gastrointestinal: Negative for abdominal pain, diarrhea, nausea and vomiting.  Genitourinary: Negative for dysuria.  Musculoskeletal: Negative for myalgias.  Skin: Negative for rash.  Neurological: Negative for headaches.     Physical Exam Updated Vital Signs BP 118/86   Pulse 105   Temp 98.4 F (36.9 C) (Oral)   Resp 22   Ht 5\' 6"  (1.676 m)   Wt 113.4 kg   LMP 04/12/2016   SpO2 96%   BMI 40.35 kg/m   Physical Exam  Constitutional: She is oriented to person, place, and time. She appears well-developed and well-nourished.  HENT:  Head: Normocephalic and atraumatic.  Mouth/Throat: Oropharynx is clear and moist.  Eyes: Conjunctivae are normal. Right eye exhibits no discharge. Left eye exhibits no discharge.  Neck: Normal range of motion. Neck supple.  Cardiovascular: Normal rate, regular rhythm and normal heart sounds.   Pulmonary/Chest: Effort normal. She has wheezes (Moderate wheezing all fields).  Abdominal: Soft. She exhibits no mass. There is no tenderness. There is no guarding.  Neurological: She is alert and oriented to person, place, and time.  Skin: Skin is warm and dry.  Psychiatric: She has a normal mood and affect.  Nursing note and vitals reviewed.    ED Treatments / Results   DIAGNOSTIC STUDIES: Oxygen Saturation is 95% on room air, adequate by my interpretation.    COORDINATION OF CARE: 6:15 PM Discussed treatment plan with pt at bedside and pt agreed to plan, which includes breathing treatments, IV fluids, and observation.  7:43 PM Reevaluated.  8:18 PM Reevaluated. Improved, but not enough to be discharged home. Pt is being put on Magnesium and Albuterol nebulizer, and observation continued.  10:58 PM Reevaluated.    Labs (all labs ordered are listed, but only abnormal results are displayed) Labs Reviewed    BASIC METABOLIC PANEL - Abnormal; Notable for the following:       Result Value   Chloride 99 (*)    Glucose, Bld 116 (*)    Calcium 8.8 (*)    All other components within normal limits  CBC WITH DIFFERENTIAL/PLATELET    EKG  EKG Interpretation None       Radiology Dg Chest 2 View  Result Date: 05/10/2016 CLINICAL DATA:  Cough and shortness of Breath EXAM: CHEST  2 VIEW COMPARISON:  None. FINDINGS: The heart size and mediastinal contours are within normal limits. Both lungs are clear. The visualized skeletal structures are unremarkable. IMPRESSION: No active cardiopulmonary disease. Electronically Signed   By: Alcide Clever M.D.   On: 05/10/2016 17:39    Procedures Procedures (including critical care  time)  Medications Ordered in ED Medications  albuterol (PROVENTIL) (2.5 MG/3ML) 0.083% nebulizer solution 2.5 mg (2.5 mg Nebulization Given 05/10/16 1657)  ipratropium-albuterol (DUONEB) 0.5-2.5 (3) MG/3ML nebulizer solution 3 mL (3 mLs Nebulization Given 05/10/16 1657)  albuterol (PROVENTIL) (2.5 MG/3ML) 0.083% nebulizer solution 5 mg (5 mg Nebulization Given 05/10/16 1848)  ipratropium (ATROVENT) nebulizer solution 0.5 mg (0.5 mg Nebulization Given 05/10/16 1848)  methylPREDNISolone sodium succinate (SOLU-MEDROL) 125 mg/2 mL injection 125 mg (125 mg Intravenous Given 05/10/16 1833)  albuterol (PROVENTIL) (2.5 MG/3ML) 0.083% nebulizer solution 2.5 mg (2.5 mg Nebulization Given 05/10/16 1936)  albuterol (PROVENTIL,VENTOLIN) solution continuous neb (10 mg/hr Nebulization Given 05/10/16 2032)  magnesium sulfate IVPB 2 g 50 mL (0 g Intravenous Stopped 05/10/16 2144)     Initial Impression / Assessment and Plan / ED Course  I have reviewed the triage vital signs and the nursing notes.  Pertinent labs & imaging results that were available during my care of the patient were reviewed by me and considered in my medical decision making (see chart for details).     Patient improved over several hours  of treatments in the emergency department. Discussed discharged home. Patient discussed with and seen by Dr. Ethelda Chick.   D/c with prednisone.   Vital signs reviewed and are as follows: BP 118/86   Pulse 105   Temp 98.4 F (36.9 C) (Oral)   Resp 22   Ht 5\' 6"  (1.676 m)   Wt 113.4 kg   LMP 04/12/2016   SpO2 96%   BMI 40.35 kg/m   Patient invited to return back with worsening symptoms, worsening shortness of breath, high fevers, persistent vomiting, new symptoms or other concerns. She verbalizes understanding and agrees with plan.  Final Clinical Impressions(s) / ED Diagnoses   Final diagnoses:  Exacerbation of asthma, unspecified asthma severity, unspecified whether persistent  Wheezing   Patient with recent upper respiratory symptoms symptoms, fever which is now resolved, with worsening asthma flare. Treatment as above. Patient improved clinically to the point where she can go home. Return instructions as above.   New Prescriptions Discharge Medication List as of 05/10/2016 11:19 PM    START taking these medications   Details  predniSONE (DELTASONE) 20 MG tablet Take 2 tablets (40 mg total) by mouth daily., Starting Sat 05/10/2016, Print       I personally performed the services described in this documentation, which was scribed in my presence. The recorded information has been reviewed and is accurate.     Renne Crigler, PA-C 05/18/16 1511    Doug Sou, MD 05/19/16 1209

## 2016-05-10 NOTE — ED Triage Notes (Signed)
Patient states that she has been SOB for a day. Has a course unproductive cough. Patient is very anxious in triage

## 2016-05-10 NOTE — ED Provider Notes (Signed)
Complains of cough and shortness of breath onset possibly 5 days ago. Denies any fever. She feels much improved since treatment here and feels ready to go home. On exam she is in no distress. Speaks in paragraphs lungs with minimal end expiratory wheezes. Chest x-ray viewed by me   Doug SouSam Anniemae Haberkorn, MD 05/10/16 2311

## 2016-05-10 NOTE — Discharge Instructions (Signed)
Please read and follow all provided instructions.  Your diagnoses today include:  1. Exacerbation of asthma, unspecified asthma severity, unspecified whether persistent   2. Wheezing     Tests performed today include:  Blood counts and electrolytes  Chest x-ray-no infection  Vital signs. See below for your results today.   Medications prescribed:   Prednisone - steroid medicine   It is best to take this medication in the morning to prevent sleeping problems. If you are diabetic, monitor your blood sugar closely and stop taking Prednisone if blood sugar is over 300. Take with food to prevent stomach upset.   Take any prescribed medications only as directed.  Home care instructions:  Follow any educational materials contained in this packet.  Use 2 puffs of your inhaler every 4 hours for the next 24 hours.  Follow-up instructions: Please follow-up with your primary care provider in the next 2 days for further evaluation of your symptoms and management of your asthma.  Return instructions:   Please return to the Emergency Department if you experience worsening symptoms.  Please return with worsening wheezing, shortness of breath, or difficulty breathing.  Return with persistent fever above 101F.   Please return if you have any other emergent concerns.  Additional Information:  Your vital signs today were: BP 116/80    Pulse 98    Temp 98.4 F (36.9 C) (Oral)    Resp 22    Ht 5\' 6"  (1.676 m)    Wt 113.4 kg    LMP 04/12/2016    SpO2 96%    BMI 40.35 kg/m  If your blood pressure (BP) was elevated above 135/85 this visit, please have this repeated by your doctor within one month. --------------

## 2016-05-13 ENCOUNTER — Telehealth: Payer: Self-pay | Admitting: Medical

## 2016-05-13 ENCOUNTER — Ambulatory Visit: Payer: Self-pay | Admitting: Sports Medicine

## 2016-05-13 ENCOUNTER — Encounter: Payer: Self-pay | Admitting: Family Medicine

## 2016-05-13 ENCOUNTER — Observation Stay (HOSPITAL_COMMUNITY)
Admission: EM | Admit: 2016-05-13 | Discharge: 2016-05-14 | Disposition: A | Discharge status: Home / Self Care | Payer: 59 | Attending: Internal Medicine | Admitting: Internal Medicine

## 2016-05-13 ENCOUNTER — Emergency Department (HOSPITAL_COMMUNITY): Payer: 59

## 2016-05-13 ENCOUNTER — Ambulatory Visit (INDEPENDENT_AMBULATORY_CARE_PROVIDER_SITE_OTHER): Payer: 59 | Admitting: Family Medicine

## 2016-05-13 ENCOUNTER — Encounter (HOSPITAL_COMMUNITY): Payer: Self-pay

## 2016-05-13 VITALS — BP 124/84 | HR 99 | Temp 98.3°F | Ht 66.0 in | Wt 246.2 lb

## 2016-05-13 DIAGNOSIS — E039 Hypothyroidism, unspecified: Secondary | ICD-10-CM | POA: Insufficient documentation

## 2016-05-13 DIAGNOSIS — J45901 Unspecified asthma with (acute) exacerbation: Principal | ICD-10-CM | POA: Diagnosis present

## 2016-05-13 DIAGNOSIS — E876 Hypokalemia: Secondary | ICD-10-CM

## 2016-05-13 DIAGNOSIS — Z79899 Other long term (current) drug therapy: Secondary | ICD-10-CM | POA: Diagnosis not present

## 2016-05-13 DIAGNOSIS — R0602 Shortness of breath: Secondary | ICD-10-CM | POA: Diagnosis not present

## 2016-05-13 DIAGNOSIS — Z6841 Body Mass Index (BMI) 40.0 and over, adult: Secondary | ICD-10-CM | POA: Diagnosis not present

## 2016-05-13 DIAGNOSIS — J4521 Mild intermittent asthma with (acute) exacerbation: Secondary | ICD-10-CM

## 2016-05-13 DIAGNOSIS — J0140 Acute pansinusitis, unspecified: Secondary | ICD-10-CM | POA: Diagnosis not present

## 2016-05-13 DIAGNOSIS — D72829 Elevated white blood cell count, unspecified: Secondary | ICD-10-CM

## 2016-05-13 DIAGNOSIS — K219 Gastro-esophageal reflux disease without esophagitis: Secondary | ICD-10-CM

## 2016-05-13 DIAGNOSIS — R059 Cough, unspecified: Secondary | ICD-10-CM

## 2016-05-13 DIAGNOSIS — R05 Cough: Secondary | ICD-10-CM | POA: Diagnosis not present

## 2016-05-13 DIAGNOSIS — G43009 Migraine without aura, not intractable, without status migrainosus: Secondary | ICD-10-CM

## 2016-05-13 LAB — CBC WITH DIFFERENTIAL/PLATELET
BASOS PCT: 0 %
Basophils Absolute: 0 10*3/uL (ref 0.0–0.1)
EOS PCT: 0 %
Eosinophils Absolute: 0 10*3/uL (ref 0.0–0.7)
HCT: 41 % (ref 36.0–46.0)
HEMOGLOBIN: 13.4 g/dL (ref 12.0–15.0)
Lymphocytes Relative: 39 %
Lymphs Abs: 6.7 10*3/uL — ABNORMAL HIGH (ref 0.7–4.0)
MCH: 27.6 pg (ref 26.0–34.0)
MCHC: 32.7 g/dL (ref 30.0–36.0)
MCV: 84.5 fL (ref 78.0–100.0)
MONO ABS: 1 10*3/uL (ref 0.1–1.0)
Monocytes Relative: 6 %
NEUTROS ABS: 9.5 10*3/uL — AB (ref 1.7–7.7)
Neutrophils Relative %: 55 %
Platelets: 342 10*3/uL (ref 150–400)
RBC: 4.85 MIL/uL (ref 3.87–5.11)
RDW: 14.2 % (ref 11.5–15.5)
WBC: 17.2 10*3/uL — ABNORMAL HIGH (ref 4.0–10.5)

## 2016-05-13 LAB — CBC
HCT: 38.4 % (ref 36.0–46.0)
Hemoglobin: 12.2 g/dL (ref 12.0–15.0)
MCH: 26.6 pg (ref 26.0–34.0)
MCHC: 31.8 g/dL (ref 30.0–36.0)
MCV: 83.7 fL (ref 78.0–100.0)
PLATELETS: 337 10*3/uL (ref 150–400)
RBC: 4.59 MIL/uL (ref 3.87–5.11)
RDW: 14.2 % (ref 11.5–15.5)
WBC: 18 10*3/uL — ABNORMAL HIGH (ref 4.0–10.5)

## 2016-05-13 LAB — BASIC METABOLIC PANEL
Anion gap: 12 (ref 5–15)
BUN: 16 mg/dL (ref 6–20)
CHLORIDE: 101 mmol/L (ref 101–111)
CO2: 23 mmol/L (ref 22–32)
CREATININE: 0.7 mg/dL (ref 0.44–1.00)
Calcium: 8.7 mg/dL — ABNORMAL LOW (ref 8.9–10.3)
GFR calc Af Amer: >60 mL/min (ref >60)
GFR calc non Af Amer: >60 mL/min (ref >60)
GLUCOSE: 104 mg/dL — AB (ref 65–99)
POTASSIUM: 3.2 mmol/L — AB (ref 3.5–5.1)
SODIUM: 136 mmol/L (ref 135–145)

## 2016-05-13 LAB — I-STAT TROPONIN, ED: Troponin i, poc: 0 ng/mL (ref 0.00–0.08)

## 2016-05-13 LAB — CREATININE, SERUM
Creatinine, Ser: 0.81 mg/dL (ref 0.44–1.00)
GFR calc Af Amer: >60 mL/min (ref >60)
GFR calc non Af Amer: >60 mL/min (ref >60)

## 2016-05-13 LAB — INFLUENZA PANEL BY PCR (TYPE A & B)
Influenza A By PCR: NEGATIVE
Influenza B By PCR: NEGATIVE

## 2016-05-13 MED ORDER — POTASSIUM CHLORIDE CRYS ER 20 MEQ PO TBCR
40.0000 meq | EXTENDED_RELEASE_TABLET | Freq: Two times a day (BID) | ORAL | Status: AC
Start: 2016-05-13 — End: 2016-05-14
  Administered 2016-05-13 – 2016-05-14 (×2): 40 meq via ORAL
  Filled 2016-05-13 (×4): qty 2

## 2016-05-13 MED ORDER — HEPARIN SODIUM (PORCINE) 5000 UNIT/ML IJ SOLN
5000.0000 [IU] | Freq: Three times a day (TID) | INTRAMUSCULAR | Status: DC
  Filled 2016-05-13 (×3): qty 1

## 2016-05-13 MED ORDER — MOMETASONE FURO-FORMOTEROL FUM 100-5 MCG/ACT IN AERO
2.0000 | INHALATION_SPRAY | Freq: Two times a day (BID) | RESPIRATORY_TRACT | Status: DC
  Administered 2016-05-13 – 2016-05-14 (×2): 2 via RESPIRATORY_TRACT
  Filled 2016-05-13: qty 8.8

## 2016-05-13 MED ORDER — METHYLPREDNISOLONE ACETATE 80 MG/ML IJ SUSP
80.0000 mg | Freq: Once | INTRAMUSCULAR | Status: AC
  Administered 2016-05-13: 80 mg via INTRAMUSCULAR

## 2016-05-13 MED ORDER — POLYETHYLENE GLYCOL 3350 17 G PO PACK
17.0000 g | PACK | Freq: Every day | ORAL | Status: DC | PRN
  Filled 2016-05-13: qty 1

## 2016-05-13 MED ORDER — NORETHIN ACE-ETH ESTRAD-FE 1.5-30 MG-MCG PO TABS: 1.0000 | ORAL_TABLET | Freq: Every day | ORAL | Status: DC

## 2016-05-13 MED ORDER — BUDESONIDE-FORMOTEROL FUMARATE 80-4.5 MCG/ACT IN AERO: 2.0000 | INHALATION_SPRAY | Freq: Two times a day (BID) | RESPIRATORY_TRACT | 3 refills | Status: DC

## 2016-05-13 MED ORDER — ALBUTEROL SULFATE (2.5 MG/3ML) 0.083% IN NEBU
2.5000 mg | INHALATION_SOLUTION | RESPIRATORY_TRACT | Status: DC
  Administered 2016-05-13 – 2016-05-14 (×4): 2.5 mg via RESPIRATORY_TRACT
  Filled 2016-05-13 (×4): qty 3

## 2016-05-13 MED ORDER — ALBUTEROL SULFATE (2.5 MG/3ML) 0.083% IN NEBU: 5.0000 mg | INHALATION_SOLUTION | Freq: Once | RESPIRATORY_TRACT | Status: DC

## 2016-05-13 MED ORDER — HYOSCYAMINE SULFATE ER 0.375 MG PO TB12
0.3750 mg | ORAL_TABLET | Freq: Two times a day (BID) | ORAL | Status: DC
  Filled 2016-05-13 (×3): qty 1

## 2016-05-13 MED ORDER — MAGNESIUM SULFATE 2 GM/50ML IV SOLN
2.0000 g | Freq: Once | INTRAVENOUS | Status: AC
  Administered 2016-05-13: 2 g via INTRAVENOUS
  Filled 2016-05-13: qty 50

## 2016-05-13 MED ORDER — SODIUM CHLORIDE 0.9 % IJ SOLN
INTRAMUSCULAR | Status: AC
  Filled 2016-05-13: qty 10

## 2016-05-13 MED ORDER — ACETAMINOPHEN 325 MG PO TABS
650.0000 mg | ORAL_TABLET | Freq: Four times a day (QID) | ORAL | Status: DC | PRN
  Administered 2016-05-13: 650 mg via ORAL
  Filled 2016-05-13 (×3): qty 2

## 2016-05-13 MED ORDER — ALBUTEROL SULFATE (2.5 MG/3ML) 0.083% IN NEBU: 2.5000 mg | INHALATION_SOLUTION | Freq: Four times a day (QID) | RESPIRATORY_TRACT | Status: DC | PRN

## 2016-05-13 MED ORDER — GUAIFENESIN-CODEINE 100-10 MG/5ML PO SYRP: 10.0000 mL | ORAL_SOLUTION | Freq: Every evening | ORAL | 0 refills | Status: DC | PRN

## 2016-05-13 MED ORDER — POTASSIUM CHLORIDE CRYS ER 20 MEQ PO TBCR
40.0000 meq | EXTENDED_RELEASE_TABLET | Freq: Once | ORAL | Status: AC
  Administered 2016-05-13: 40 meq via ORAL
  Filled 2016-05-13: qty 2

## 2016-05-13 MED ORDER — ZOLPIDEM TARTRATE 5 MG PO TABS
5.0000 mg | ORAL_TABLET | Freq: Once | ORAL | Status: AC
  Administered 2016-05-13: 5 mg via ORAL

## 2016-05-13 MED ORDER — IPRATROPIUM BROMIDE 0.02 % IN SOLN
0.5000 mg | Freq: Once | RESPIRATORY_TRACT | Status: AC
  Administered 2016-05-13: 0.5 mg via RESPIRATORY_TRACT
  Filled 2016-05-13: qty 2.5

## 2016-05-13 MED ORDER — METHYLPREDNISOLONE SODIUM SUCC 125 MG IJ SOLR: 80.0000 mg | Freq: Two times a day (BID) | INTRAMUSCULAR | Status: DC

## 2016-05-13 MED ORDER — ACETAMINOPHEN 650 MG RE SUPP
650.0000 mg | Freq: Four times a day (QID) | RECTAL | Status: DC | PRN
  Filled 2016-05-13: qty 1

## 2016-05-13 MED ORDER — LEVOTHYROXINE SODIUM 50 MCG PO TABS
50.0000 ug | ORAL_TABLET | Freq: Every day | ORAL | Status: DC
  Administered 2016-05-14: 50 ug via ORAL
  Filled 2016-05-13 (×2): qty 1

## 2016-05-13 MED ORDER — IBUPROFEN 200 MG PO TABS
200.0000 mg | ORAL_TABLET | Freq: Four times a day (QID) | ORAL | Status: DC | PRN
  Administered 2016-05-13: 200 mg via ORAL
  Filled 2016-05-13 (×2): qty 1

## 2016-05-13 MED ORDER — IPRATROPIUM-ALBUTEROL 0.5-2.5 (3) MG/3ML IN SOLN
3.0000 mL | Freq: Four times a day (QID) | RESPIRATORY_TRACT | Status: DC
  Administered 2016-05-13: 3 mL via RESPIRATORY_TRACT

## 2016-05-13 MED ORDER — ALBUTEROL SULFATE HFA 108 (90 BASE) MCG/ACT IN AERS: 2.0000 | INHALATION_SPRAY | Freq: Four times a day (QID) | RESPIRATORY_TRACT | Status: DC | PRN

## 2016-05-13 MED ORDER — ONDANSETRON HCL 4 MG/2ML IJ SOLN
4.0000 mg | Freq: Once | INTRAMUSCULAR | Status: AC
  Administered 2016-05-13: 4 mg via INTRAVENOUS
  Filled 2016-05-13: qty 2

## 2016-05-13 MED ORDER — GUAIFENESIN ER 600 MG PO TB12
600.0000 mg | ORAL_TABLET | Freq: Two times a day (BID) | ORAL | Status: DC
  Administered 2016-05-13 – 2016-05-14 (×2): 600 mg via ORAL
  Filled 2016-05-13 (×4): qty 1

## 2016-05-13 MED ORDER — FLUOXETINE HCL 20 MG PO CAPS
20.0000 mg | ORAL_CAPSULE | Freq: Every day | ORAL | Status: DC
Start: 2016-05-14 — End: 2016-05-14
  Administered 2016-05-14: 20 mg via ORAL
  Filled 2016-05-13 (×2): qty 1

## 2016-05-13 MED ORDER — ALBUTEROL SULFATE (2.5 MG/3ML) 0.083% IN NEBU: 2.5000 mg | INHALATION_SOLUTION | RESPIRATORY_TRACT | Status: DC | PRN

## 2016-05-13 MED ORDER — METHYLPREDNISOLONE SODIUM SUCC 125 MG IJ SOLR
80.0000 mg | Freq: Four times a day (QID) | INTRAMUSCULAR | Status: AC
  Administered 2016-05-13: 23:00:00 via INTRAVENOUS
  Administered 2016-05-14 (×2): 80 mg via INTRAVENOUS
  Filled 2016-05-13 (×2): qty 1.28
  Filled 2016-05-13: qty 2
  Filled 2016-05-13: qty 1.28

## 2016-05-13 MED ORDER — PREDNISONE 10 MG PO TABS
60.0000 mg | ORAL_TABLET | Freq: Every day | ORAL | Status: DC
  Administered 2016-05-14: 60 mg via ORAL
  Filled 2016-05-13 (×2): qty 1

## 2016-05-13 MED ORDER — IPRATROPIUM BROMIDE 0.02 % IN SOLN: 0.5000 mg | Freq: Once | RESPIRATORY_TRACT | Status: DC

## 2016-05-13 MED ORDER — ALBUTEROL SULFATE (2.5 MG/3ML) 0.083% IN NEBU
2.5000 mg | INHALATION_SOLUTION | RESPIRATORY_TRACT | Status: DC
  Administered 2016-05-13: 2.5 mg via RESPIRATORY_TRACT

## 2016-05-13 MED ORDER — ALBUTEROL (5 MG/ML) CONTINUOUS INHALATION SOLN
10.0000 mg/h | INHALATION_SOLUTION | RESPIRATORY_TRACT | Status: DC
  Administered 2016-05-13: 10 mg/h via RESPIRATORY_TRACT
  Filled 2016-05-13: qty 20

## 2016-05-13 MED ORDER — SODIUM CHLORIDE 0.9 % IV SOLN
INTRAVENOUS | Status: DC
  Administered 2016-05-13 – 2016-05-14 (×2): via INTRAVENOUS

## 2016-05-13 NOTE — ED Triage Notes (Signed)
Patient is Coming from doctors office . Pt states she has been having SOB a week ago. [facility]. She was given Abx with no relief. On Saturday she was seen at [facility] with dx of bronchospam Pt already received duoneb X2 by EMS.

## 2016-05-13 NOTE — Telephone Encounter (Signed)
Patient has appointment scheduled for today with Pulmonary.

## 2016-05-13 NOTE — Telephone Encounter (Signed)
Patient Name: Debra West DOB: [birth year] Initial Comment Caller states she is having trouble breathing. She has a cold and it has settled into her chest. Nurse Assessment Nurse: Yetta BarreJones, RN, Miranda Date/Time (Eastern Time): 05/13/2016 9:42:41 AM Confirm and document reason for call. If symptomatic, describe symptoms. ---Caller states she has been having a cough and trouble breathing for 4 days. She was seen in the ED on Saturday. Albuterol Q4-6 hrs (last dose 1.5 hrs ago). She had been seen in the office 1 week ago and diagnosed with a sinus infection and started on antibiotics. Does the patient have any new or worsening symptoms? ---Yes Will a triage be completed? ---Yes Related visit to physician within the last 2 weeks? ---Yes Does the PT have any chronic conditions? (i.e. diabetes, asthma, etc.) ---Yes List chronic conditions. ---Asthma, Thyroid Is the patient pregnant or possibly pregnant? (Ask all females between the ages of 8612-55) ---No Is this a behavioral health or substance abuse call? ---No Guidelines Guideline Title Affirmed Question Affirmed Notes Asthma Attack [1] Coughing continuously (nonstop) AND [2] keeps from working or sleeping AND [3] not improved after 2 nebulizer or inhaler treatments given 20 minutes apart Final Disposition User See Physician within 4 Hours (or PCP triage) Yetta BarreJones, RN, Miranda Comments No appt available at primary office. Appt scheduled for 10:30 with Gaspar BiddingMichael Rigby at [facility]. Referrals GO TO FACILITY OTHER - SPECIFY Disagree/Comply: Comply

## 2016-05-13 NOTE — ED Notes (Signed)
Bed: WA20 Expected date:  Expected time:  Means of arrival:  Comments: EMS 

## 2016-05-13 NOTE — ED Notes (Signed)
Patient transported to X-ray 

## 2016-05-13 NOTE — Progress Notes (Signed)
Patient ID: Debra West, female  DOB: 01-29-1976  Age: 41 y.o. MRN: [id number]  Debra Shuttersicole Myles is a 41 y.o. female here for an established patient visit to discuss:   History of Present Illness:   SHORTNESS OF BREATH: Patient with recent flu-like illness, then subsequent bronchitis with RAD. On Augmentin, Prednisone, Albuterol. She has also already had one visit to the ER already for the same. Note reviewed, with interventions noted, including corticosteroid injection. Hx of RAD with bronchospasm requiring hospitalization. No documented asthma. Complains of chest restrictions, SOB, wheeze. Hard to lie down. Tired. Symptoms getting worse despite outpatient treatment. Nonsmoker.   PMHx, SurgHx, SocialHx, Medications, and Allergies were reviewed in the Visit Navigator and updated as appropriate.  Past Medical History:  Diagnosis Date  . Anemia    pernicious anemia. hx. as a child  . Anxiety   . Cardiac arrhythmia   . Chronic headaches   . Gallstones   . GERD (gastroesophageal reflux disease)   . Hypothyroidism 01/17/2016  . Obesity   . Pneumonia   . Vasodepressor syncope    Past Surgical History:  Procedure Laterality Date  . CHOLECYSTECTOMY  2004  . OVARIAN CYST REMOVAL Left   . WISDOM TOOTH EXTRACTION        Family History  Problem Relation Age of Onset  . Thyroid disease Mother   . Heart disease Father   . Stroke Father   . Heart attack Father   . Thyroid disease Sister   . Lung cancer Maternal Grandfather   . Heart disease Maternal Grandfather   . Diabetes Paternal Grandmother   . Thyroid cancer Paternal Grandmother   . Heart disease Paternal Grandfather   . Breast cancer Paternal Aunt   . Bladder Cancer Paternal Aunt   . Thyroid disease Paternal Aunt   . Cancer Paternal Aunt     mouth and face    Social History  Substance Use Topics  . Smoking status: Never Smoker  . Smokeless tobacco: Never Used  . Alcohol use Yes     Comment: Occasionally       Allergies  Allergen Reactions  . Aspirin Nausea And Vomiting      No current facility-administered medications for this visit.   Current Outpatient Prescriptions:  .  albuterol (PROVENTIL HFA;VENTOLIN HFA) 108 (90 Base) MCG/ACT inhaler, Inhale 2 puffs into the lungs every 6 (six) hours as needed for wheezing or shortness of breath., Disp: 1 Inhaler, Rfl: 0 .  amoxicillin-clavulanate (AUGMENTIN) 875-125 MG tablet, Take 1 tablet by mouth 2 (two) times daily. (Patient taking differently: Take 1 tablet by mouth 2 (two) times daily. Started 01/30 for 10 days), Disp: 20 tablet, Rfl: 0 .  hyoscyamine (LEVBID) 0.375 MG 12 hr tablet, Take 1 tablet (0.375 mg total) by mouth 2 (two) times daily. (Patient not taking: Reported on 05/13/2016), Disp: 30 tablet, Rfl: 0 .  levothyroxine (SYNTHROID, LEVOTHROID) 50 MCG tablet, Take 1 tablet (50 mcg total) by mouth daily., Disp: 90 tablet, Rfl: 0 .  midodrine (PROAMATINE) 5 MG tablet, Take 5 mg by mouth 3 (three) times daily as needed (for low blood pressure (less than 105)). , Disp: , Rfl:  .  ondansetron (ZOFRAN) 4 MG tablet, Take 1 tablet (4 mg total) by mouth every 8 (eight) hours as needed for nausea or vomiting. (Patient not taking: Reported on 05/13/2016), Disp: 20 tablet, Rfl: 0 .  predniSONE (DELTASONE) 20 MG tablet, Take 2 tablets (40 mg total) by mouth daily. (Patient taking differently: Take  40 mg by mouth daily. Started 02/04 for 4 days), Disp: 8 tablet, Rfl: 0 .  ranitidine (ZANTAC) 150 MG capsule, Take 1 capsule (150 mg total) by mouth at bedtime. (Patient taking differently: Take 150 mg by mouth daily as needed for heartburn. ), Disp: 30 capsule, Rfl: 11 .  FLUoxetine (PROZAC) 20 MG capsule, Take 20 mg by mouth daily., Disp: , Rfl:  .  guaiFENesin-codeine (CHERATUSSIN AC) 100-10 MG/5ML syrup, Take 10 mLs by mouth at bedtime as needed for cough or congestion. (Patient not taking: Reported on 05/13/2016), Disp: 120 mL, Rfl: 0 .  ibuprofen (ADVIL,MOTRIN)  200 MG tablet, Take 400-600 mg by mouth every 4 (four) hours as needed for fever, headache, mild pain, moderate pain or cramping., Disp: , Rfl:    REVIEW OF SYSTEMS: Pertinent items noted in HPI and remainder of comprehensive ROS otherwise negative.  Physical Exam:   Vitals:   05/13/16 1044  BP: 124/84  Pulse: 99  Temp: 98.3 F (36.8 C)     Body mass index is 39.74 kg/m.  General: Alert, cooperative, appears stated age. Anxious.  HEENT:  Normocephalic, without obvious abnormality, atraumatic. Conjunctivae/corneas clear. PERRL, EOM's intact. Normal TM's and external ear canals both ears. Nares normal. Septum midline. Mucosa normal.   Lungs: Diffuse wheeze, restriction, increased WOB.  Heart:: Tachycardic, S1, S2 normal, no murmur, click, rub or gallop.  Abdomen: Soft, non-tender; bowel sounds normal; no masses,  no organomegaly.  Extremities: Extremities normal, atraumatic, no cyanosis or edema.  Pulses: 2+ and symmetric.  Skin: Skin color, texture, turgor normal. No rashes or lesions.  Neurologic: Alert and oriented X 3, normal strength and tone. Normal symmetric. reflexes. Normal coordination and gait.  Psych: Alert,oriented, in NAD with a full range of affect, normal behavior and no psychotic features     Assessment and Plan:   Derricka was seen today for acute visit.  Diagnoses and all orders for this visit:  Cough -     ipratropium-albuterol (DUONEB) 0.5-2.5 (3) MG/3ML nebulizer solution 3 mL; Take 3 mLs by nebulization every 6 (six) hours. -     methylPREDNISolone acetate (DEPO-MEDROL) injection 80 mg; Inject 1 mL (80 mg total) into the muscle once.  SOB (shortness of breath) Comments: Patient without clinical improvement after treatment in office. O2 saturations noted to drop to 90% RA even while sitting. Placed on [facility]. With permission, patient transferred to [facility] via non emergency ambulance transport. Belongings with patient. Car remained in parking lot.    Helane Rima, D.O. Family Medicine [employer], [facility]

## 2016-05-13 NOTE — ED Notes (Signed)
Respiratory called for breathing treatment.

## 2016-05-13 NOTE — ED Notes (Signed)
Hospitalist at bedside 

## 2016-05-13 NOTE — H&P (Signed)
History and Physical    Jenette Rayson WUJ:[id number] DOB: 1975/05/17 DOA: 05/13/2016  PCP: Esperanza Richters, PA-C  Patient coming from: PCP office  Chief Complaint: SOB  HPI: Debra West is a 41 y.o. female with past medical history significant of past medical history of asthma/reactive airway disease hypothyroidism and morbid obesity that comes in for shortness of breath that started 4 days prior to admission, she relates that about a week ago she started having flulike symptoms with sinus congestion but was not short of breath, went to her PCP was negative for the flu  diagnosed with sinusitis was started on Augmentin  did not improve, went to [facility] on [facility]10[facility]2016 was started on steroids and sent home with no improvement. So she decided to come to the ED she denies any fever, diarrhea, sick contacts, chest pain or nausea or vomiting.  ED Course:  She was found to be mildly hypokalemic with a white count 17 and left shift, CXR does not show infiltrate.  Review of Systems: As per HPI otherwise 10 point review of systems negative.   Past Medical History:  Diagnosis Date  . Anemia    pernicious anemia. hx. as a child  . Anxiety   . Cardiac arrhythmia   . Chronic headaches   . Gallstones   . GERD (gastroesophageal reflux disease)   . Hypothyroidism 01/17/2016  . Obesity   . Pneumonia   . Vasodepressor syncope     Past Surgical History:  Procedure Laterality Date  . CHOLECYSTECTOMY  2004  . OVARIAN CYST REMOVAL Left   . WISDOM TOOTH EXTRACTION       reports that she has never smoked. She has never used smokeless tobacco. She reports that she drinks alcohol. She reports that she does not use drugs.  Allergies  Allergen Reactions  . Aspirin Nausea And Vomiting    Family History  Problem Relation Age of Onset  . Thyroid disease Mother   . Heart disease Father   . Stroke Father   . Heart attack Father   . Thyroid disease Sister   . Lung cancer Maternal  Grandfather   . Heart disease Maternal Grandfather   . Diabetes Paternal Grandmother   . Thyroid cancer Paternal Grandmother   . Heart disease Paternal Grandfather   . Breast cancer Paternal Aunt   . Bladder Cancer Paternal Aunt   . Thyroid disease Paternal Aunt   . Cancer Paternal Aunt     mouth and face     Prior to Admission medications   Medication Sig Start Date End Date Taking? Authorizing Provider  albuterol (PROVENTIL HFA;VENTOLIN HFA) 108 (90 Base) MCG/ACT inhaler Inhale 2 puffs into the lungs every 6 (six) hours as needed for wheezing or shortness of breath. 05/06/16  Yes Renee A Kuneff, DO  amoxicillin-clavulanate (AUGMENTIN) 875-125 MG tablet Take 1 tablet by mouth 2 (two) times daily. Patient taking differently: Take 1 tablet by mouth 2 (two) times daily. Started 01/30 for 10 days 05/06/16  Yes Renee A Kuneff, DO  FLUoxetine (PROZAC) 20 MG capsule Take 20 mg by mouth daily.   Yes Historical Provider, MD  ibuprofen (ADVIL,MOTRIN) 200 MG tablet Take 400-600 mg by mouth every 4 (four) hours as needed for fever, headache, mild pain, moderate pain or cramping.   Yes Historical Provider, MD  levothyroxine (SYNTHROID, LEVOTHROID) 50 MCG tablet Take 1 tablet (50 mcg total) by mouth daily. 04/11/16  Yes Edward Saguier, PA-C  midodrine (PROAMATINE) 5 MG tablet Take 5 mg  by mouth 3 (three) times daily as needed (for low blood pressure (less than 105)).    Yes Historical Provider, MD  norethindrone-ethinyl estradiol-iron (LOESTRIN FE 1.5/30) 1.5-30 MG-MCG tablet Take 1 tablet by mouth daily.   Yes Historical Provider, MD  PE-Diphenhydramine-DM-GG-APAP (MUCINEX FAST-MAX DAY/NIGHT PO) Take 2 tablets by mouth 3 (three) times daily as needed (for cold).   Yes Historical Provider, MD  Phenylephrine-DM-GG-APAP (MUCINEX FAST-MAX SEVERE COLD) 5-10-200-325 MG/10ML LIQD Take 10 mLs by mouth 3 (three) times daily as needed (for cold).   Yes Historical Provider, MD  predniSONE (DELTASONE) 20 MG tablet Take  2 tablets (40 mg total) by mouth daily. Patient taking differently: Take 40 mg by mouth daily. Started 02/04 for 4 days 05/10/16  Yes Renne CriglerJoshua Geiple, PA-C  ranitidine (ZANTAC) 150 MG capsule Take 1 capsule (150 mg total) by mouth at bedtime. Patient taking differently: Take 150 mg by mouth daily as needed for heartburn.  02/08/16  Yes Rachael Feeaniel P Jacobs, MD  budesonide-formoterol [facility](SYMBICORT) 80-4.5 MCG/ACT inhaler Inhale 2 puffs into the lungs 2 (two) times daily. Patient not taking: Reported on 05/13/2016 05/13/16   Helane RimaErica Wallace, DO  guaiFENesin-codeine (CHERATUSSIN AC) 100-10 MG/5ML syrup Take 10 mLs by mouth at bedtime as needed for cough or congestion. Patient not taking: Reported on 05/13/2016 05/13/16   Helane RimaErica Wallace, DO  hyoscyamine (LEVBID) 0.375 MG 12 hr tablet Take 1 tablet (0.375 mg total) by mouth 2 (two) times daily. Patient not taking: Reported on 05/13/2016 01/18/16   Esperanza RichtersEdward Saguier, PA-C  ondansetron (ZOFRAN) 4 MG tablet Take 1 tablet (4 mg total) by mouth every 8 (eight) hours as needed for nausea or vomiting. Patient not taking: Reported on 05/13/2016 05/06/16   Natalia Leatherwoodenee A Kuneff, DO    Physical Exam: Vitals:   05/13/16 1256 05/13/16 1304 05/13/16 1354 05/13/16 1518  BP: 140/82   128/78  Pulse: 104   109  Resp: 17   (!) 28  Temp: 98.8 F (37.1 C)     TempSrc: Axillary     SpO2: 97%  94% 95%  Weight:  113.4 kg (250 lb)    Height:  5' 6.25" (1.683 m)        Constitutional: NAD, calm, comfortable Vitals:   05/13/16 1256 05/13/16 1304 05/13/16 1354 05/13/16 1518  BP: 140/82   128/78  Pulse: 104   109  Resp: 17   (!) 28  Temp: 98.8 F (37.1 C)     TempSrc: Axillary     SpO2: 97%  94% 95%  Weight:  113.4 kg (250 lb)    Height:  5' 6.25" (1.683 m)     Eyes: PERRL, lids and conjunctivae normal, Morbidly obese ENMT: Mucous membranes are moist.  Respiratory: Moderate air movement with rhonchi and wheezing bilaterally, mildly tachypneic. Cardiovascular: Regular rate and rhythm, no  murmurs / rubs / gallops. No extremity edema. 2 Abdomen: no tenderness, no masses palpated. No hepatosplenomegaly. Bowel sounds positive.  Musculoskeletal: no clubbing / cyanosis. No joint deformity upper and lower extremities. Good ROM, no contractures. Normal muscle tone.  Skin: no rashes, lesions, ulcers. No induration Neurologic: CN 2-12 grossly intact. Sensation intact, DTR normal. Strength 5/5 in all 4.  Psychiatric: Normal judgment and insight. Alert and oriented x 3. Normal mood.    Labs on Admission: I have personally reviewed following labs and imaging studies  CBC:  Recent Labs Lab 05/10/16 1838 05/13/16 1409  WBC 5.7 17.2*  NEUTROABS 1.9 9.5*  HGB 13.2 13.4  HCT 41.4 41.0  MCV 85.7 84.5  PLT 302 342   Basic Metabolic Panel:  Recent Labs Lab 05/10/16 1838 05/13/16 1409  NA 135 136  K 3.6 3.2*  CL 99* 101  CO2 27 23  GLUCOSE 116* 104*  BUN 8 16  CREATININE 0.49 0.70  CALCIUM 8.8* 8.7*   GFR: Estimated Creatinine Clearance: 118.8 mL/min (by C-G formula based on SCr of 0.7 mg/dL). Liver Function Tests: No results for input(s): AST, ALT, ALKPHOS, BILITOT, PROT, ALBUMIN in the last 168 hours. No results for input(s): LIPASE, AMYLASE in the last 168 hours. No results for input(s): AMMONIA in the last 168 hours. Coagulation Profile: No results for input(s): INR, PROTIME in the last 168 hours. Cardiac Enzymes: No results for input(s): CKTOTAL, CKMB, CKMBINDEX, TROPONINI in the last 168 hours. BNP (last 3 results) No results for input(s): PROBNP in the last 8760 hours. HbA1C: No results for input(s): HGBA1C in the last 72 hours. CBG: No results for input(s): GLUCAP in the last 168 hours. Lipid Profile: No results for input(s): CHOL, HDL, LDLCALC, TRIG, CHOLHDL, LDLDIRECT in the last 72 hours. Thyroid Function Tests: No results for input(s): TSH, T4TOTAL, FREET4, T3FREE, THYROIDAB in the last 72 hours. Anemia Panel: No results for input(s): VITAMINB12,  FOLATE, FERRITIN, TIBC, IRON, RETICCTPCT in the last 72 hours. Urine analysis:    Component Value Date/Time   COLORURINE YELLOW 01/09/2016 2205   APPEARANCEUR CLEAR 01/09/2016 2205   LABSPEC >1.046 (H) 01/09/2016 2205   PHURINE 6.0 01/09/2016 2205   GLUCOSEU NEGATIVE 01/09/2016 2205   HGBUR TRACE (A) 01/09/2016 2205   BILIRUBINUR negative 01/31/2016 1838   KETONESUR 15 (A) 01/09/2016 2205   PROTEINUR negative 01/31/2016 1838   PROTEINUR NEGATIVE 01/09/2016 2205   UROBILINOGEN 4.0 01/31/2016 1838   NITRITE negative 01/31/2016 1838   NITRITE NEGATIVE 01/09/2016 2205   LEUKOCYTESUR Negative 01/31/2016 1838   Sepsis Labs: !!!!!!!!!!!!!!!!!!!!!!!!!!!!!!!!!!!!!!!!!!!! @LABRCNTIP (procalcitonin:4,lacticidven:4) )No results found for this or any previous visit (from the past 240 hour(s)).   Radiological Exams on Admission: Dg Chest 2 View  Result Date: 05/13/2016 CLINICAL DATA:  Cold symptoms, difficulty breathing over the past week. Shortness of breath. EXAM: CHEST  2 VIEW COMPARISON:  PA and lateral chest 05/10/2016. FINDINGS: Lungs are clear. Heart size is normal. No pneumothorax or pleural effusion. No focal bony abnormality. IMPRESSION: No acute disease. Electronically Signed   By: Drusilla Kanner M.D.   On: 05/13/2016 15:23    EKG: Independently reviewed. Normal sinus rhythm no T wave abnormality.  Assessment/Plan Active Problems:   SOB (shortness of breath)   Reactive airway disease with wheezing   Hypokalemia   Leukocytosis I will go ahead and admit her to med surge, as she is mildly tachypnea, breathing 25-28 times per minute. He is maintaining her saturations above 90%. I will go ahead and start him on IV Solu-Medrol and inhalers regularly, will ambulate her in the morning and check her saturations and an additional drop can probably be discharged in the morning. Start her on albuterol every 4 and every 2 when necessary. His complete a seven-day course of Augmentin will not  proceed with antibiotic at this point. She has remained afebrile her leukocytosis is probably due to steroids. Hyperkalemia is likely due to albuterol repeat orally and replete magnesium  DVT prophylaxis: heparin Code Status: full Family Communication: None Disposition Plan: home 2.7.2018 Consults called: none Admission status: med   Marinda Elk MD [facility] Pager [phone]  If 7PM-7AM, please contact night-coverage [email or web address] Password [email or web address]  05/13/2016, 4:14 PM

## 2016-05-13 NOTE — Progress Notes (Signed)
Pre visit review using our clinic review tool, if applicable. No additional management support is needed unless otherwise documented below in the visit note. 

## 2016-05-13 NOTE — Telephone Encounter (Signed)
Patient called requesting an appointment to be seen today. She states that she is unable to breathe very well due to the cold that she has. She states that everything is settling into her lungs and chest. Patient also states that she is unable to sleep or breathe very well when sleeping, she says that even when she sleeping sitting up it doesn't help. Transferred to team health to be evaluated by a nurse.

## 2016-05-13 NOTE — ED Provider Notes (Signed)
[facility]-EMERGENCY DEPT Provider Note   CSN: [id number] Arrival date & time: 05/13/16  1247     History   Chief Complaint Chief Complaint  Patient presents with  . Shortness of Breath    HPI Debra West is a 41 y.o. female with a PMHx of asthma/RAD, hypothyroidism, anemia, GERD, and anxiety, who presents to the ED with complaints of gradually worsening shortness of breath 4 days. She reports that 1wk ago she developed flu like symptoms and sinus congestion but wasn't SOB; Chart review reveals she was seen at her PCPs office on 05/06/16 and had a neg flu test, was diagnosed with sinusitis, given augmentin; she states that she still felt like she had a cold and hadn't really improved as far as her sinus congestion and flulike symptoms were concerned, but she had been short of breath until Friday 4 days ago. [facility] was [facility], had labs and CXR which were negative, given solumedrol 125mg , 2 duonebs, 1 albuterol tx, and CAT tx as well as Mg 2g IV then d/c'd with prednisone. She states that [facility] she felt somewhat better, has been taking her prednisone as prescribed, but over the last several days she has gradually worsened and felt more short of breath. Additionally she has a dry cough, ongoing sinus congestion with clear rhinorrhea, chills, chest tightness, and wheezing. No known aggravating factors. She has had some relief with her home prednisone and albuterol but today it wasn't helping. She went to her PCPs office again today who gave her a depomedrol 80mg  IM shot and 1 albuterol tx then sent her here. En route she was given one more albuterol tx with mild improvement. She also reports that she's had some nausea and 5 episodes daily of nonbloody watery diarrhea for the last 1 week and has been fairly intermittent. Of note, she states she has never been diagnosed with asthma but she has environmental allergies and when she gets sick she usually gets inhalers and steroids  because of wheezing. Nonsmoker. +Sick contacts. No recent suspicious food intake, EtOH use, or NSAID use. She is on OCPs.  She denies ongoing fevers (none since Friday), hemoptysis, ear pain/drainage, sore throat, CP, LE swelling, recent travel/surgery/immobilization, personal/family hx of DVT/PE, abd pain, vomiting, constipation, obstipation, melena, hematochezia, hematuria, dysuria, numbness, tingling, focal weakness, or any other complaints at this time.    The history is provided by the patient and medical records. No language interpreter was used.  Shortness of Breath  This is a new problem. The average episode lasts 4 days. The problem occurs continuously.The current episode started more than 2 days ago. The problem has been gradually worsening. Associated symptoms include rhinorrhea, cough and wheezing. Pertinent negatives include no fever, no sore throat, no ear pain, no hemoptysis, no chest pain, no vomiting, no abdominal pain and no leg swelling. Precipitated by: URI last week. Risk factors include oral contraceptive use. She has tried oral steroids and beta-agonist inhalers for the symptoms. The treatment provided mild relief. She has had prior ED visits. Associated medical issues include asthma (reactive airway disease). Associated medical issues do not include PE, DVT or recent surgery.    Past Medical History:  Diagnosis Date  . Anemia    pernicious anemia. hx. as a child  . Anxiety   . Cardiac arrhythmia   . Chronic headaches   . Gallstones   . GERD (gastroesophageal reflux disease)   . Hypothyroidism 01/17/2016  . Obesity   .  Pneumonia   . Vasodepressor syncope     Patient Active Problem List   Diagnosis Date Noted  . Hypothyroidism 01/17/2016    Past Surgical History:  Procedure Laterality Date  . CHOLECYSTECTOMY  2004  . OVARIAN CYST REMOVAL Left   . WISDOM TOOTH EXTRACTION      OB History    Gravida Para Term Preterm AB Living   0 0 0 0 0 0   SAB TAB Ectopic  Multiple Live Births   0 0 0 0 0       Home Medications    Prior to Admission medications   Medication Sig Start Date End Date Taking? Authorizing Provider  albuterol (PROVENTIL HFA;VENTOLIN HFA) 108 (90 Base) MCG/ACT inhaler Inhale 2 puffs into the lungs every 6 (six) hours as needed for wheezing or shortness of breath. 05/06/16  Yes Renee A Kuneff, DO  amoxicillin-clavulanate (AUGMENTIN) 875-125 MG tablet Take 1 tablet by mouth 2 (two) times daily. Patient taking differently: Take 1 tablet by mouth 2 (two) times daily. Started 01/30 for 10 days 05/06/16  Yes Renee A Kuneff, DO  FLUoxetine (PROZAC) 20 MG capsule Take 20 mg by mouth daily.   Yes Historical Provider, MD  levothyroxine (SYNTHROID, LEVOTHROID) 50 MCG tablet Take 1 tablet (50 mcg total) by mouth daily. 04/11/16  Yes Edward Saguier, PA-C  midodrine (PROAMATINE) 5 MG tablet Take 5 mg by mouth 3 (three) times daily as needed (for low blood pressure (less than 105)).    Yes Historical Provider, MD  norethindrone-ethinyl estradiol-iron (LOESTRIN FE 1.5/30) 1.5-30 MG-MCG tablet Take 1 tablet by mouth daily.   Yes Historical Provider, MD  predniSONE (DELTASONE) 20 MG tablet Take 2 tablets (40 mg total) by mouth daily. Patient taking differently: Take 40 mg by mouth daily. Started 02/04 for 4 days 05/10/16  Yes Renne Crigler, PA-C  ranitidine (ZANTAC) 150 MG capsule Take 1 capsule (150 mg total) by mouth at bedtime. Patient taking differently: Take 150 mg by mouth daily as needed for heartburn.  02/08/16  Yes Rachael Fee, MD  budesonide-formoterol [facility]) 80-4.5 MCG/ACT inhaler Inhale 2 puffs into the lungs 2 (two) times daily. Patient not taking: Reported on 05/13/2016 05/13/16   Helane Rima, DO  guaiFENesin-codeine (CHERATUSSIN AC) 100-10 MG/5ML syrup Take 10 mLs by mouth at bedtime as needed for cough or congestion. Patient not taking: Reported on 05/13/2016 05/13/16   Helane Rima, DO  hyoscyamine (LEVBID) 0.375 MG 12 hr tablet Take 1  tablet (0.375 mg total) by mouth 2 (two) times daily. Patient not taking: Reported on 05/13/2016 01/18/16   Esperanza Richters, PA-C  ondansetron (ZOFRAN) 4 MG tablet Take 1 tablet (4 mg total) by mouth every 8 (eight) hours as needed for nausea or vomiting. Patient not taking: Reported on 05/13/2016 05/06/16   Natalia Leatherwood, DO    Family History Family History  Problem Relation Age of Onset  . Thyroid disease Mother   . Heart disease Father   . Stroke Father   . Heart attack Father   . Thyroid disease Sister   . Lung cancer Maternal Grandfather   . Heart disease Maternal Grandfather   . Diabetes Paternal Grandmother   . Thyroid cancer Paternal Grandmother   . Heart disease Paternal Grandfather   . Breast cancer Paternal Aunt   . Bladder Cancer Paternal Aunt   . Thyroid disease Paternal Aunt   . Cancer Paternal Aunt     mouth and face     Social History Social  History  Substance Use Topics  . Smoking status: Never Smoker  . Smokeless tobacco: Never Used  . Alcohol use Yes     Comment: Occasionally     Allergies   Aspirin   Review of Systems Review of Systems  Constitutional: Positive for chills. Negative for fever.  HENT: Positive for rhinorrhea and sinus pressure. Negative for ear discharge, ear pain and sore throat.   Respiratory: Positive for cough, chest tightness, shortness of breath and wheezing. Negative for hemoptysis.   Cardiovascular: Negative for chest pain and leg swelling.  Gastrointestinal: Positive for diarrhea and nausea. Negative for abdominal pain, blood in stool, constipation and vomiting.  Genitourinary: Negative for dysuria and hematuria.  Musculoskeletal: Negative for arthralgias and myalgias.  Skin: Negative for color change.  Allergic/Immunologic: Positive for environmental allergies. Negative for immunocompromised state.  Neurological: Negative for weakness and numbness.  Psychiatric/Behavioral: Negative for confusion.   10 Systems reviewed and  are negative for acute change except as noted in the HPI.   Physical Exam Updated Vital Signs BP 140/82   Pulse 104   Temp 98.8 F (37.1 C) (Axillary)   Resp 17   Ht 5' 6.25" (1.683 m)   Wt 113.4 kg   LMP 04/12/2016   SpO2 97%   BMI 40.05 kg/m   Physical Exam  Constitutional: She is oriented to person, place, and time. Vital signs are normal. She appears well-developed and well-nourished.  Non-toxic appearance. No distress.  Afebrile, nontoxic, NAD  HENT:  Head: Normocephalic and atraumatic.  Nose: Mucosal edema and rhinorrhea present.  Mouth/Throat: Uvula is midline, oropharynx is clear and moist and mucous membranes are normal. No trismus in the jaw. No uvula swelling. Tonsils are 0 on the right. Tonsils are 0 on the left. No tonsillar exudate.  Nasal congestion and rhinorrhea. Oropharynx clear and moist, without uvular swelling or deviation, no trismus or drooling, no tonsillar swelling or erythema, no exudates.    Eyes: Conjunctivae and EOM are normal. Right eye exhibits no discharge. Left eye exhibits no discharge.  Neck: Normal range of motion. Neck supple.  Cardiovascular: Normal rate, regular rhythm, normal heart sounds and intact distal pulses.  Exam reveals no gallop and no friction rub.   No murmur heard. Mildly tachycardic likely from albuterol tx. Reg rhythm, nl s1/s2, no m/r/g, distal pulses intact, no pedal edema   Pulmonary/Chest: Effort normal. No respiratory distress. She has no decreased breath sounds. She has wheezes. She has rhonchi. She has no rales.  Diffuse inspiratory and expiratory wheezing in all lung fields, scattered rhonchi throughout, no rales, no hypoxia or increased WOB, speaking in full sentences, SpO2 97% on RA   Abdominal: Soft. Normal appearance and bowel sounds are normal. She exhibits no distension. There is no tenderness. There is no rigidity, no rebound, no guarding, no CVA tenderness, no tenderness at McBurney's point and negative Murphy's  sign.  Musculoskeletal: Normal range of motion.  Neurological: She is alert and oriented to person, place, and time. She has normal strength. No sensory deficit.  Skin: Skin is warm, dry and intact. No rash noted.  Psychiatric: She has a normal mood and affect.  Nursing note and vitals reviewed.    ED Treatments / Results  Labs (all labs ordered are listed, but only abnormal results are displayed) Labs Reviewed  CBC WITH DIFFERENTIAL/PLATELET - Abnormal; Notable for the following:       Result Value   WBC 17.2 (*)    All other components within normal limits  BASIC METABOLIC PANEL - Abnormal; Notable for the following:    Potassium 3.2 (*)    Glucose, Bld 104 (*)    Calcium 8.7 (*)    All other components within normal limits  INFLUENZA PANEL BY PCR (TYPE A & B)  I-STAT TROPOININ, ED    EKG  EKG Interpretation  Date/Time:  Tuesday May 13 2016 13:39:38 EST Ventricular Rate:  88 PR Interval:    QRS Duration: 86 QT Interval:  370 QTC Calculation: 448 R Axis:   48 Text Interpretation:  Sinus or ectopic atrial rhythm Short PR interval Low voltage, precordial leads T wave abnormality Abnormal ekg Confirmed by Gerhard Munch  MD [phone]) on 05/13/2016 1:43:03 PM       Radiology Dg Chest 2 View  Result Date: 05/13/2016 CLINICAL DATA:  Cold symptoms, difficulty breathing over the past week. Shortness of breath. EXAM: CHEST  2 VIEW COMPARISON:  PA and lateral chest 05/10/2016. FINDINGS: Lungs are clear. Heart size is normal. No pneumothorax or pleural effusion. No focal bony abnormality. IMPRESSION: No acute disease. Electronically Signed   By: Drusilla Kanner M.D.   On: 05/13/2016 15:23     Dg Chest 2 View  Result Date: 05/10/2016 CLINICAL DATA:  Cough and shortness of Breath EXAM: CHEST  2 VIEW COMPARISON:  None. FINDINGS: The heart size and mediastinal contours are within normal limits. Both lungs are clear. The visualized skeletal structures are unremarkable. IMPRESSION:  No active cardiopulmonary disease. Electronically Signed   By: Alcide Clever M.D.   On: 05/10/2016 17:39     Procedures Procedures (including critical care time)  CRITICAL CARE- multiple albuterol tx Performed by: Rhona Raider   Total critical care time: 45 minutes  Critical care time was exclusive of separately billable procedures and treating other patients.  Critical care was necessary to treat or prevent imminent or life-threatening deterioration.  Critical care was time spent personally by me on the following activities: development of treatment plan with patient and/or surrogate as well as nursing, discussions with consultants, evaluation of patient's response to treatment, examination of patient, obtaining history from patient or surrogate, ordering and performing treatments and interventions, ordering and review of laboratory studies, ordering and review of radiographic studies, pulse oximetry and re-evaluation of patient's condition.   Medications Ordered in ED Medications  albuterol (PROVENTIL,VENTOLIN) solution continuous neb (0 mg/hr Nebulization Stopped 05/13/16 1518)  potassium chloride SA (K-DUR,KLOR-CON) CR tablet 40 mEq (not administered)  albuterol (PROVENTIL) (2.5 MG/3ML) 0.083% nebulizer solution 5 mg (not administered)  ipratropium (ATROVENT) nebulizer solution 0.5 mg (not administered)  magnesium sulfate IVPB 2 g 50 mL (0 g Intravenous Stopped 05/13/16 1452)  ondansetron (ZOFRAN) injection 4 mg (4 mg Intravenous Given 05/13/16 1346)  ipratropium (ATROVENT) nebulizer solution 0.5 mg (0.5 mg Nebulization Given 05/13/16 1354)     Initial Impression / Assessment and Plan / ED Course  I have reviewed the triage vital signs and the nursing notes.  Pertinent labs & imaging results that were available during my care of the patient were reviewed by me and considered in my medical decision making (see chart for details).     41 y.o. female here with gradually worsening  SOB x4 days, had flu symptoms last week, tested neg for flu at PCPs, given augmentin for sinusitis but didn't really improve, then 4 days ago was more SOB, so she went to [facility] and got several breathing tx including IV Mg then d/c'd home with prednisone; went back to PCP today and was  found to be worse, given DepoMedrol 80mg  IM and albuterol tx and sent here; one more albuterol tx given en route without much improvement. Lung sounds still with diffuse inspiratory and expiratory wheezing and scattered rhonchi. Mildly tachycardic likely from albuterol tx. No LE swelling. Doubt PE, likely worsening asthma/early PNA. Will likely need admission. Will get labs, repeat CXR, EKG, and give Mg 2g IV and CAT tx with ipratropium mixed in, then reassess shortly. Discussed case with my attending Dr. Jeraldine Loots who agrees with plan.   3:38 PM CBC w/diff with leukocytosis WBC 17.2 likely from steroids; differential pending. BMP with mildly low K 3.2 likely from albuterol use, but will give Kdur. Trop neg. EKG without acute findings. CXR negative. Lung sounds still with diffuse wheezing, slightly better than before, but still not improved enough to be able to go home. Pt feels slightly better but agrees that she doesn't feel like she could go home at this time. Will proceed with admission. Will also repeat duoneb now. Will get flu swab, although she had a neg swab result on 05/06/16 but since she's being admitted, will repeat this.   3:47 PM Dr. David Stall of [facility] returning page and will admit. Holding orders placed. Please see their notes for further documentation of care. I appreciate their help with this pleasant pt's care. Pt stable at time of admission.    Final Clinical Impressions(s) / ED Diagnoses   Final diagnoses:  Shortness of breath  Reactive airway disease with acute exacerbation, unspecified asthma severity, unspecified whether persistent  Hypokalemia  Leukocytosis, unspecified type    New  Prescriptions New Prescriptions   No medications on file     [address] PA-C 05/13/16 1548    Gerhard Munch, MD 05/13/16 1626

## 2016-05-13 NOTE — ED Notes (Signed)
ED Provider at bedside. 

## 2016-05-14 ENCOUNTER — Telehealth: Payer: Self-pay | Admitting: Medical

## 2016-05-14 DIAGNOSIS — Z6841 Body Mass Index (BMI) 40.0 and over, adult: Secondary | ICD-10-CM

## 2016-05-14 DIAGNOSIS — G43009 Migraine without aura, not intractable, without status migrainosus: Secondary | ICD-10-CM | POA: Diagnosis not present

## 2016-05-14 DIAGNOSIS — E876 Hypokalemia: Secondary | ICD-10-CM | POA: Diagnosis not present

## 2016-05-14 DIAGNOSIS — J45901 Unspecified asthma with (acute) exacerbation: Secondary | ICD-10-CM | POA: Diagnosis not present

## 2016-05-14 DIAGNOSIS — R0602 Shortness of breath: Secondary | ICD-10-CM | POA: Diagnosis not present

## 2016-05-14 DIAGNOSIS — J018 Other acute sinusitis: Secondary | ICD-10-CM

## 2016-05-14 DIAGNOSIS — J0140 Acute pansinusitis, unspecified: Secondary | ICD-10-CM

## 2016-05-14 DIAGNOSIS — E6609 Other obesity due to excess calories: Secondary | ICD-10-CM

## 2016-05-14 DIAGNOSIS — K219 Gastro-esophageal reflux disease without esophagitis: Secondary | ICD-10-CM

## 2016-05-14 LAB — BASIC METABOLIC PANEL
Anion gap: 11 (ref 5–15)
BUN: 10 mg/dL (ref 6–20)
CO2: 21 mmol/L — ABNORMAL LOW (ref 22–32)
CREATININE: 0.57 mg/dL (ref 0.44–1.00)
Calcium: 8.3 mg/dL — ABNORMAL LOW (ref 8.9–10.3)
Chloride: 104 mmol/L (ref 101–111)
GFR calc Af Amer: >60 mL/min (ref >60)
GLUCOSE: 154 mg/dL — AB (ref 65–99)
Potassium: 4.7 mmol/L (ref 3.5–5.1)
SODIUM: 136 mmol/L (ref 135–145)

## 2016-05-14 MED ORDER — PREDNISONE 20 MG PO TABS: ORAL_TABLET | ORAL | 0 refills | Status: DC

## 2016-05-14 MED ORDER — IPRATROPIUM-ALBUTEROL 20-100 MCG/ACT IN AERS: 1.0000 | INHALATION_SPRAY | Freq: Four times a day (QID) | RESPIRATORY_TRACT | 2 refills | Status: DC | PRN

## 2016-05-14 MED ORDER — LORATADINE-PSEUDOEPHEDRINE ER 10-240 MG PO TB24: 1.0000 | ORAL_TABLET | Freq: Every day | ORAL | 1 refills | Status: DC

## 2016-05-14 MED ORDER — IBUPROFEN 200 MG PO TABS: 400.0000 mg | ORAL_TABLET | Freq: Three times a day (TID) | ORAL | Status: DC | PRN

## 2016-05-14 MED ORDER — SUMATRIPTAN SUCCINATE 100 MG PO TABS: 100.0000 mg | ORAL_TABLET | Freq: Two times a day (BID) | ORAL | 0 refills | Status: DC | PRN

## 2016-05-14 MED ORDER — BUDESONIDE-FORMOTEROL FUMARATE 160-4.5 MCG/ACT IN AERO: 2.0000 | INHALATION_SPRAY | Freq: Two times a day (BID) | RESPIRATORY_TRACT | 3 refills | Status: DC

## 2016-05-14 NOTE — Progress Notes (Signed)
05/14/16  1145  Reviewed discharge instructions with patient. Patient verbalized understanding of discharge instructions. Copy of discharge instructions, work note, and prescriptions given to patient.

## 2016-05-14 NOTE — Discharge Summary (Signed)
Physician Discharge Summary  Debra West HYQ:[id number] DOB: 1976-01-05 DOA: 05/13/2016  PCP: Esperanza Richters, PA-C  Admit date: 05/13/2016 Discharge date: 05/14/2016  Time spent: 35 minutes  Recommendations for Outpatient Follow-up:  Reassess resolution of acute asthma/sinusitis flare Follow medication compliance Please arrange follow up with pulmonary service for PFT's and further adjustments on her maintenance asthma therapy.  Discharge Diagnoses:  Active Problems:   SOB (shortness of breath)   Reactive airway disease with acute exacerbation   Hypokalemia   Leukocytosis   Asthma exacerbation   Migraine without aura and without status migrainosus, not intractable   Acute pansinusitis   Gastroesophageal reflux disease Obesity (BMI 40.05)  Discharge Condition: stable and improved. Discharge home with instructions to follow up with PCP in 10 days.  Diet recommendation: low calorie diet   Filed Weights   05/13/16 1304  Weight: 113.4 kg (250 lb)    History of present illness:  As per Dr. David Stall H&P written on 05/13/16 41 y.o. female with past medical history significant of past medical history of asthma/reactive airway disease hypothyroidism and morbid obesity that comes in for shortness of breath that started 4 days prior to admission, she relates that about a week ago she started having flulike symptoms with sinus congestion but was not short of breath, went to her PCP was negative for the flu  diagnosed with sinusitis was started on Augmentin  did not improve, [facility]2016 was started on steroids and sent home with no improvement. So she decided to come to the ED she denies any fever, diarrhea, sick contacts, chest pain or nausea or vomiting.  Hospital Course:  1-SOB: due to asthma exacerbation and acute sinusitis  -patient will complete antibiotics as prescribed by her PCP -discharge on steroid tapering and initiation of symbicort BID -patient  will also use PRN combivent -as an outpatient after taking care of acute exacerbation will benefit of visit with pulmonary service for PFT's and further adjustments on her her maintenance therapy  -no need for oxygen supplementation appreciated on assessment  -continue PRN antitussive meds.  2-acute sinusitis  -patient will complete antibiotics as prescribed by PCP -started on Claritin daily -instructed to use humidifier and to continue using nasal spray  3-GERD -will continue Zantac  4-obesity -Body mass index is 40.05 kg/m. -low calorie diet and exercise discussed with patient   5-hypothyroidism -will continue synthroid   6-Migraine  -will use PRN imitrex and tylenol/ibuprofen  -patients HA most likely triggered by sinusitis process    Procedures:  See below for x-ray reports   Consultations:  None   Discharge Exam: Vitals:   05/13/16 2130 05/14/16 0450  BP: 116/72 112/78  Pulse: 88 (!) 106  Resp: 18 17  Temp: 98.8 F (37.1 C) 98.9 F (37.2 C)    General: afebrile, in NAD, able to speak in full sentences. Patient still with some intermittent cough and nasal congestion. Patient with good O2 sat on RA (at rest and with activity) Cardiovascular: S1 and S2, no rubs, no gallops, no murmurs.  Respiratory: scattered rhonchi and positive end expiratory wheezing; improved air movement and no using accessory muscles. Abd: soft, NT, ND, positive BS Extremities: no cyanosis or clubbing  Discharge Instructions   Discharge Instructions    Discharge instructions    Complete by:  As directed    Take medications as prescribed  Keep yourself well hydrated  Please arrange follow up with PCP in 10 days   Increase activity slowly  Complete by:  As directed      Current Discharge Medication List    START taking these medications   Details  budesonide-formoterol (SYMBICORT) 160-4.5 MCG/ACT inhaler Inhale 2 puffs into the lungs 2 times daily at 12 noon and 4 pm. Qty:  1 Inhaler, Refills: 3    Ipratropium-Albuterol (COMBIVENT RESPIMAT) 20-100 MCG/ACT AERS respimat Inhale 1 puff into the lungs every 6 (six) hours as needed for wheezing or shortness of breath. Qty: 1 Inhaler, Refills: 2    loratadine-pseudoephedrine (CLARITIN-D 24 HOUR) 10-240 MG 24 hr tablet Take 1 tablet by mouth daily. Qty: 30 tablet, Refills: 1    SUMAtriptan (IMITREX) 100 MG tablet Take 1 tablet (100 mg total) by mouth every 12 (twelve) hours as needed (intractable migraine). May repeat in 2 hours if headache persists or recurs. Qty: 12 tablet, Refills: 0      CONTINUE these medications which have CHANGED   Details  ibuprofen (ADVIL,MOTRIN) 200 MG tablet Take 2-3 tablets (400-600 mg total) by mouth every 8 (eight) hours as needed for fever, headache, mild pain, moderate pain or cramping.    predniSONE (DELTASONE) 20 MG tablet Take 3 tablet by mouth daily X1 day; then 2 tablet by mouth daily X 2 days; then 1 tablet by mouth daily X 3 days; then 1/2 tablet by mouth daily X 3 days. Qty: 15 tablet, Refills: 0      CONTINUE these medications which have NOT CHANGED   Details  amoxicillin-clavulanate (AUGMENTIN) 875-125 MG tablet Take 1 tablet by mouth 2 (two) times daily. Qty: 20 tablet, Refills: 0   Associated Diagnoses: Acute maxillary sinusitis, recurrence not specified    FLUoxetine (PROZAC) 20 MG capsule Take 20 mg by mouth daily.    levothyroxine (SYNTHROID, LEVOTHROID) 50 MCG tablet Take 1 tablet (50 mcg total) by mouth daily. Qty: 90 tablet, Refills: 0    midodrine (PROAMATINE) 5 MG tablet Take 5 mg by mouth 3 (three) times daily as needed (for low blood pressure (less than 105)).     norethindrone-ethinyl estradiol-iron (LOESTRIN FE 1.5/30) 1.5-30 MG-MCG tablet Take 1 tablet by mouth daily.    ranitidine (ZANTAC) 150 MG capsule Take 1 capsule (150 mg total) by mouth at bedtime. Qty: 30 capsule, Refills: 11    guaiFENesin-codeine (CHERATUSSIN AC) 100-10 MG/5ML syrup Take  10 mLs by mouth at bedtime as needed for cough or congestion. Qty: 120 mL, Refills: 0   Associated Diagnoses: Cough    ondansetron (ZOFRAN) 4 MG tablet Take 1 tablet (4 mg total) by mouth every 8 (eight) hours as needed for nausea or vomiting. Qty: 20 tablet, Refills: 0   Associated Diagnoses: Acute maxillary sinusitis, recurrence not specified; Nausea and vomiting, intractability of vomiting not specified, unspecified vomiting type      STOP taking these medications     albuterol (PROVENTIL HFA;VENTOLIN HFA) 108 (90 Base) MCG/ACT inhaler      PE-Diphenhydramine-DM-GG-APAP (MUCINEX FAST-MAX DAY/NIGHT PO)      Phenylephrine-DM-GG-APAP (MUCINEX FAST-MAX SEVERE COLD) 5-10-200-325 MG/10ML LIQD      budesonide-formoterol (SYMBICORT) 80-4.5 MCG/ACT inhaler      hyoscyamine (LEVBID) 0.375 MG 12 hr tablet        Allergies  Allergen Reactions  . Aspirin Nausea And Vomiting   Follow-up Information    Saguier, Kateri Mcdward, PA-C. Schedule an appointment as soon as possible for a visit in 10 day(s).   Specialties:  Internal Medicine, Family Medicine Contact information: [address] [place] [address] [phone]  The results of significant diagnostics from this hospitalization (including imaging, microbiology, ancillary and laboratory) are listed below for reference.    Significant Diagnostic Studies: Dg Chest 2 View  Result Date: 05/13/2016 CLINICAL DATA:  Cold symptoms, difficulty breathing over the past week. Shortness of breath. EXAM: CHEST  2 VIEW COMPARISON:  PA and lateral chest 05/10/2016. FINDINGS: Lungs are clear. Heart size is normal. No pneumothorax or pleural effusion. No focal bony abnormality. IMPRESSION: No acute disease. Electronically Signed   By: Drusilla Kanner M.D.   On: 05/13/2016 15:23   Dg Chest 2 View  Result Date: 05/10/2016 CLINICAL DATA:  Cough and shortness of Breath EXAM: CHEST  2 VIEW COMPARISON:  None. FINDINGS: The heart  size and mediastinal contours are within normal limits. Both lungs are clear. The visualized skeletal structures are unremarkable. IMPRESSION: No active cardiopulmonary disease. Electronically Signed   By: Alcide Clever M.D.   On: 05/10/2016 17:39   Labs: Basic Metabolic Panel:  Recent Labs Lab 05/10/16 1838 05/13/16 1409 05/13/16 1700 05/14/16 0332  NA 135 136  --  136  K 3.6 3.2*  --  4.7  CL 99* 101  --  104  CO2 27 23  --  21*  GLUCOSE 116* 104*  --  154*  BUN 8 16  --  10  CREATININE 0.49 0.70 0.81 0.57  CALCIUM 8.8* 8.7*  --  8.3*   CBC:  Recent Labs Lab 05/10/16 1838 05/13/16 1409 05/13/16 1700  WBC 5.7 17.2* 18.0*  NEUTROABS 1.9 9.5*  --   HGB 13.2 13.4 12.2  HCT 41.4 41.0 38.4  MCV 85.7 84.5 83.7  PLT 302 342 337    Signed:  Vassie Loll MD.  [facility] 05/14/2016, 11:04 AM

## 2016-05-14 NOTE — Telephone Encounter (Signed)
Okay with transfer? 

## 2016-05-14 NOTE — Telephone Encounter (Signed)
Ok with transfer. Have her follow up with new pcp. Recent hospitalized. So if she if switching follow up should not be with me. Thanks.

## 2016-05-14 NOTE — Telephone Encounter (Signed)
Patient would like to transfer care due to location. Sending a request for approval from both of you out of courtesy for transfers. Awaiting approval from both of you before making patient appointment.   Thank you!

## 2016-05-14 NOTE — Progress Notes (Signed)
Pulse Oximetry while ambulating, 97 % room air.  Patient in NAD< noted occasional cough.

## 2016-05-15 ENCOUNTER — Ambulatory Visit (INDEPENDENT_AMBULATORY_CARE_PROVIDER_SITE_OTHER): Payer: 59 | Admitting: Family Medicine

## 2016-05-15 ENCOUNTER — Encounter: Payer: Self-pay | Admitting: Family Medicine

## 2016-05-15 VITALS — BP 136/86 | HR 94 | Temp 97.8°F | Ht 65.55 in | Wt 246.2 lb

## 2016-05-15 DIAGNOSIS — D510 Vitamin B12 deficiency anemia due to intrinsic factor deficiency: Secondary | ICD-10-CM

## 2016-05-15 DIAGNOSIS — J4521 Mild intermittent asthma with (acute) exacerbation: Secondary | ICD-10-CM | POA: Diagnosis not present

## 2016-05-15 MED ORDER — GUAIFENESIN-CODEINE 100-10 MG/5ML PO SYRP: 10.0000 mL | ORAL_SOLUTION | Freq: Every evening | ORAL | 0 refills | Status: DC | PRN

## 2016-05-15 MED ORDER — B-12 1000 MCG PO CAPS: 1.0000 | ORAL_CAPSULE | Freq: Every day | ORAL | 5 refills | Status: AC

## 2016-05-15 NOTE — Progress Notes (Signed)
Pre visit review using our clinic review tool, if applicable. No additional management support is needed unless otherwise documented below in the visit note. 

## 2016-05-16 ENCOUNTER — Encounter: Payer: Self-pay | Admitting: Family Medicine

## 2016-05-16 DIAGNOSIS — D510 Vitamin B12 deficiency anemia due to intrinsic factor deficiency: Secondary | ICD-10-CM | POA: Insufficient documentation

## 2016-05-16 LAB — PATHOLOGIST SMEAR REVIEW

## 2016-05-16 NOTE — Progress Notes (Signed)
Deb[facility] West is a 41 y.o. [facility]   History of Present Illness:    1. Mild intermittent reactive airway disease with acute exacerbation: With recent hospitalization for one night after failing outpatient management. Hospital note reviewed. Discharge medications: Symbicort, Combivent, Prednisone taper, Claritin. Patient is feeling much better. Never needed O2 while in hospital.    2. Pernicious anemia  3. Morbid obesity (HCC)     There are no preventive care reminders to display for this patient.   PMHx, SurgHx, SocialHx, Medications, and Allergies were reviewed in the Visit Navigator and updated as appropriate.    Past Medical History:  Diagnosis Date  . Anemia    pernicious anemia. hx. as a child  . Anxiety   . Cardiac arrhythmia   . Chronic headaches   . GERD (gastroesophageal reflux disease)   . Hypothyroidism 01/17/2016  . Obesity   . Vasodepressor syncope     Past Surgical History:  Procedure Laterality Date  . CHOLECYSTECTOMY  2004  . OVARIAN CYST REMOVAL Left   . WISDOM TOOTH EXTRACTION      Family History  Problem Relation Age of Onset  . Thyroid disease Mother   . Heart disease Father   . Stroke Father   . Heart attack Father   . Thyroid disease Sister   . Lung cancer Maternal Grandfather   . Heart disease Maternal Grandfather   . Diabetes Paternal Grandmother   . Thyroid cancer Paternal Grandmother   . Heart disease Paternal Grandfather   . Breast cancer Paternal Aunt   . Bladder Cancer Paternal Aunt   . Thyroid disease Paternal Aunt   . Cancer Paternal Aunt     Mouth    Social History  Substance Use Topics  . Smoking status: Never Smoker  . Smokeless tobacco: Never Used  . Alcohol use Yes     Comment: Occasionally     Current Medications and Allergies:    Current Outpatient Prescriptions:  .  amoxicillin-clavulanate (AUGMENTIN) 875-125 MG tablet, Take 1 tablet by mouth 2 (two) times daily. (Patient taking  differently: Take 1 tablet by mouth 2 (two) times daily. Started 01/30 for 10 days), Disp: 20 tablet, Rfl: 0 .  budesonide-formoterol (SYMBICORT) 160-4.5 MCG/ACT inhaler, Inhale 2 puffs into the lungs 2 times daily at 12 noon and 4 pm., Disp: 1 Inhaler, Rfl: 3 .  FLUoxetine (PROZAC) 20 MG capsule, Take 20 mg by mouth daily., Disp: , Rfl:  .  ibuprofen (ADVIL,MOTRIN) 200 MG tablet, Take 2-3 tablets (400-600 mg total) by mouth every 8 (eight) hours as needed for fever, headache, mild pain, moderate pain or cramping., Disp: , Rfl:  .  Ipratropium-Albuterol (COMBIVENT RESPIMAT) 20-100 MCG/ACT AERS respimat, Inhale 1 puff into the lungs every 6 (six) hours as needed for wheezing or shortness of breath., Disp: 1 Inhaler, Rfl: 2 .  levothyroxine (SYNTHROID, LEVOTHROID) 50 MCG tablet, Take 1 tablet (50 mcg total) by mouth daily., Disp: 90 tablet, Rfl: 0 .  loratadine-pseudoephedrine (CLARITIN-D 24 HOUR) 10-240 MG 24 hr tablet, Take 1 tablet by mouth daily., Disp: 30 tablet, Rfl: 1 .  midodrine (PROAMATINE) 5 MG tablet, Take 5 mg by mouth 3 (three) times daily as needed (for low blood pressure (less than 105)). , Disp: , Rfl:  .  norethindrone-ethinyl estradiol-iron (LOESTRIN FE 1.5/30) 1.5-30 MG-MCG tablet, Take 1 tablet by mouth daily., Disp: , Rfl:  .  predniSONE (DELTASONE) 20 MG tablet, Take 3 tablet by mouth daily X1 day;  then 2 tablet by mouth daily X 2 days; then 1 tablet by mouth daily X 3 days; then 1/2 tablet by mouth daily X 3 days., Disp: 15 tablet, Rfl: 0 .  ranitidine (ZANTAC) 150 MG capsule, Take 1 capsule (150 mg total) by mouth at bedtime. (Patient taking differently: Take 150 mg by mouth daily as needed for heartburn. ), Disp: 30 capsule, Rfl: 11 .  SUMAtriptan (IMITREX) 100 MG tablet, Take 1 tablet (100 mg total) by mouth every 12 (twelve) hours as needed (intractable migraine). May repeat in 2 hours if headache persists or recurs., Disp: 12 tablet, Rfl: 0 .  Cyanocobalamin (B-12) 1000 MCG  CAPS, Take 1 capsule by mouth daily., Disp: 30 capsule, Rfl: 5 .  guaiFENesin-codeine (CHERATUSSIN AC) 100-10 MG/5ML syrup, Take 10 mLs by mouth at bedtime as needed for cough or congestion., Disp: 120 mL, Rfl: 0 .  SYMBICORT 80-4.5 MCG/ACT inhaler, , Disp: , Rfl:  .  VENTOLIN HFA 108 (90 Base) MCG/ACT inhaler, , Disp: , Rfl:    Allergies  Allergen Reactions  . Aspirin Nausea And Vomiting      Patient Information Form: Screening and ROS    Do you feel safe in relationships? yes PHQ-2: negative  Review of Systems  General:  Negative for nexplained weight loss, fever Skin: Negative for new or changing mole, sore that won't heal HEENT: Negative for trouble hearing, trouble seeing, ringing in ears, mouth sores, hoarseness, change in voice, dysphagia CV:  Negative for chest pain, dyspnea, edema, palpitations Resp: Negative for hemoptysis GI: Negative for nausea, vomiting, diarrhea, constipation, abdominal pain, melena, hematochezia GU: Negative for dysuria, incontinence, urinary hesitance, hematuria MSK: Negative for muscle cramps or aches, joint pain or swelling Neuro: Negative for headaches, weakness, numbness, dizziness, passing out/fainting Psych: Negative for depression, anxiety, memory problems   Vitals:   Vitals:   05/15/16 0956  BP: 136/86  Pulse: 94  Temp: 97.8 F (36.6 C)  TempSrc: Oral  SpO2: 96%  Weight: 246 lb 3.2 oz (111.7 kg)  Height: 5' 5.55" (1.665 m)     Body mass index is 40.28 kg/m.   Physical Exam:     General: Alert, cooperative, appears stated age and no distress.  HEENT:  Normocephalic, without obvious abnormality, atraumatic. Conjunctivae/corneas clear. PERRL, EOM's intact. Normal TM's and external ear canals both ears. Nares normal. Septum midline. Mucosa normal. No drainage or sinus tenderness. Lips, mucosa, and tongue normal; teeth and gums normal.  Lungs: Faint wheeze bilaterally, slight dry cough, good air movement.  Heart:: Regular  rate and rhythm, S1, S2 normal, no murmur, click, rub or gallop.  Abdomen: Soft, non-tender; bowel sounds normal; no masses,  no organomegaly.  Extremities: Extremities normal, atraumatic, no cyanosis or edema.  Pulses: 2+ and symmetric.  Skin: Skin color, texture, turgor normal. No rashes or lesions.  Neurologic: Alert and oriented X 3, normal strength and tone. Normal symmetric. reflexes. Normal coordination and gait.  Psych: Alert,oriented, in NAD with a full range of affect, normal behavior and no psychotic features     Assessment and Plan:    Debra West was seen today for establish care.  Diagnoses and all orders for this visit:  Mild intermittent reactive airway disease with acute exacerbation Comments: Patient much more comfortable-appearing today. Continue Symibort, Combivent, Prednisone taper. Referral to Pulmonology for PFTs and future recommendations.  Orders: -     Ambulatory referral to Pulmonology -     guaiFENesin-codeine (CHERATUSSIN AC) 100-10 MG/5ML syrup; Take 10 mLs by mouth  at bedtime as needed for cough or congestion.  Pernicious anemia Comments: Continue oral B12 supplementation. Orders: -     Cyanocobalamin (B-12) 1000 MCG CAPS; Take 1 capsule by mouth daily.  Morbid obesity (HCC) Comments: Reviewed healthy lifestyle choices.    . Reviewed expectations re: course of current medical issues. . Discussed self-management of symptoms. . Outlined signs and symptoms indicating need for more acute intervention. . Patient verbalized understanding and all questions were answered. . See orders for this visit as documented in the electronic medical record. . Patient received an After Visit Summary.  Records requested if needed. I spent 45 minutes with this patient, greater than 50% was face-to-face time counseling regarding the above diagnoses.    Helane Rima, D.O. [place], [place] 05/16/2016   Follow-up: No Follow-up on file.  Meds ordered this  encounter  Medications  . VENTOLIN HFA 108 (90 Base) MCG/ACT inhaler  . SYMBICORT 80-4.5 MCG/ACT inhaler  . Cyanocobalamin (B-12) 1000 MCG CAPS    Sig: Take 1 capsule by mouth daily.    Dispense:  30 capsule    Refill:  5  . guaiFENesin-codeine (CHERATUSSIN AC) 100-10 MG/5ML syrup    Sig: Take 10 mLs by mouth at bedtime as needed for cough or congestion.    Dispense:  120 mL    Refill:  0   Medications Discontinued During This Encounter  Medication Reason  . guaiFENesin-codeine (CHERATUSSIN AC) 100-10 MG/5ML syrup Prescription never filled  . ondansetron (ZOFRAN) 4 MG tablet Patient Preference   Orders Placed This Encounter  Procedures  . Ambulatory referral to Pulmonology

## 2016-05-17 ENCOUNTER — Telehealth: Payer: Self-pay | Admitting: Medical

## 2016-05-17 NOTE — Telephone Encounter (Signed)
Pt [facility]. I got a note she was wanting to switch to other provider. I ok'd. But I am still listed as her pcp. Will you investigate why?

## 2016-05-19 ENCOUNTER — Encounter (HOSPITAL_BASED_OUTPATIENT_CLINIC_OR_DEPARTMENT_OTHER): Payer: Self-pay | Admitting: *Deleted

## 2016-05-19 ENCOUNTER — Emergency Department (HOSPITAL_BASED_OUTPATIENT_CLINIC_OR_DEPARTMENT_OTHER): Payer: 59

## 2016-05-19 ENCOUNTER — Emergency Department (HOSPITAL_BASED_OUTPATIENT_CLINIC_OR_DEPARTMENT_OTHER)
Admission: EM | Admit: 2016-05-19 | Discharge: 2016-05-19 | Disposition: A | Discharge status: Home / Self Care | Payer: 59 | Attending: Emergency Medicine | Admitting: Emergency Medicine

## 2016-05-19 DIAGNOSIS — E039 Hypothyroidism, unspecified: Secondary | ICD-10-CM | POA: Insufficient documentation

## 2016-05-19 DIAGNOSIS — N83202 Unspecified ovarian cyst, left side: Secondary | ICD-10-CM | POA: Diagnosis not present

## 2016-05-19 DIAGNOSIS — J45909 Unspecified asthma, uncomplicated: Secondary | ICD-10-CM | POA: Insufficient documentation

## 2016-05-19 DIAGNOSIS — N83519 Torsion of ovary and ovarian pedicle, unspecified side: Secondary | ICD-10-CM

## 2016-05-19 DIAGNOSIS — Z79899 Other long term (current) drug therapy: Secondary | ICD-10-CM | POA: Diagnosis not present

## 2016-05-19 DIAGNOSIS — R1032 Left lower quadrant pain: Secondary | ICD-10-CM | POA: Diagnosis present

## 2016-05-19 LAB — COMPREHENSIVE METABOLIC PANEL
ALT: 60 U/L — ABNORMAL HIGH (ref 14–54)
AST: 60 U/L — ABNORMAL HIGH (ref 15–41)
Albumin: 4 g/dL (ref 3.5–5.0)
Alkaline Phosphatase: 38 U/L (ref 38–126)
Anion gap: 10 (ref 5–15)
BUN: 19 mg/dL (ref 6–20)
CO2: 28 mmol/L (ref 22–32)
Calcium: 8.9 mg/dL (ref 8.9–10.3)
Chloride: 99 mmol/L — ABNORMAL LOW (ref 101–111)
Creatinine, Ser: 0.66 mg/dL (ref 0.44–1.00)
GFR calc Af Amer: >60 mL/min (ref >60)
GFR calc non Af Amer: >60 mL/min (ref >60)
Glucose, Bld: 91 mg/dL (ref 65–99)
Potassium: 4.1 mmol/L (ref 3.5–5.1)
Sodium: 137 mmol/L (ref 135–145)
Total Bilirubin: 0.7 mg/dL (ref 0.3–1.2)
Total Protein: 7.4 g/dL (ref 6.5–8.1)

## 2016-05-19 LAB — WET PREP, GENITAL
CLUE CELLS WET PREP: NONE SEEN
Sperm: NONE SEEN
Trich, Wet Prep: NONE SEEN
Yeast Wet Prep HPF POC: NONE SEEN

## 2016-05-19 LAB — CBC WITH DIFFERENTIAL/PLATELET
Band Neutrophils: 2 %
Basophils Absolute: 0.2 10*3/uL — ABNORMAL HIGH (ref 0.0–0.1)
Basophils Relative: 1 %
Blasts: 0 %
Eosinophils Absolute: 0.2 10*3/uL (ref 0.0–0.7)
Eosinophils Relative: 1 %
HCT: 45.7 % (ref 36.0–46.0)
Hemoglobin: 14.7 g/dL (ref 12.0–15.0)
Lymphocytes Relative: 33 %
Lymphs Abs: 6.6 10*3/uL — ABNORMAL HIGH (ref 0.7–4.0)
MCH: 27.5 pg (ref 26.0–34.0)
MCHC: 32.2 g/dL (ref 30.0–36.0)
MCV: 85.4 fL (ref 78.0–100.0)
Metamyelocytes Relative: 1 %
Monocytes Absolute: 0.8 10*3/uL (ref 0.1–1.0)
Monocytes Relative: 4 %
Myelocytes: 1 %
Neutro Abs: 12.3 10*3/uL — ABNORMAL HIGH (ref 1.7–7.7)
Neutrophils Relative %: 57 %
Other: 0 %
Platelets: 315 10*3/uL (ref 150–400)
Promyelocytes Absolute: 0 %
RBC: 5.35 MIL/uL — ABNORMAL HIGH (ref 3.87–5.11)
RDW: 14.6 % (ref 11.5–15.5)
WBC: 20.1 10*3/uL — ABNORMAL HIGH (ref 4.0–10.5)
nRBC: 0 /100 WBC

## 2016-05-19 LAB — URINALYSIS, ROUTINE W REFLEX MICROSCOPIC
Bilirubin Urine: NEGATIVE
Glucose, UA: NEGATIVE mg/dL
Hgb urine dipstick: NEGATIVE
Ketones, ur: NEGATIVE mg/dL
Leukocytes, UA: NEGATIVE
Nitrite: NEGATIVE
Protein, ur: NEGATIVE mg/dL
Specific Gravity, Urine: 1.02 (ref 1.005–1.030)
pH: 6 (ref 5.0–8.0)

## 2016-05-19 LAB — PREGNANCY, URINE: Preg Test, Ur: NEGATIVE

## 2016-05-19 MED ORDER — SODIUM CHLORIDE 0.9 % IV BOLUS (SEPSIS)
1000.0000 mL | Freq: Once | INTRAVENOUS | Status: AC
  Administered 2016-05-19: 1000 mL via INTRAVENOUS

## 2016-05-19 MED ORDER — KETOROLAC TROMETHAMINE 15 MG/ML IJ SOLN: 15.0000 mg | Freq: Once | INTRAMUSCULAR | Status: DC

## 2016-05-19 MED ORDER — IOPAMIDOL (ISOVUE-300) INJECTION 61%
100.0000 mL | Freq: Once | INTRAVENOUS | Status: AC | PRN
  Administered 2016-05-19: 100 mL via INTRAVENOUS

## 2016-05-19 MED ORDER — ONDANSETRON HCL 4 MG PO TABS: 4.0000 mg | ORAL_TABLET | Freq: Three times a day (TID) | ORAL | 0 refills | Status: DC | PRN

## 2016-05-19 MED ORDER — HYDROCODONE-ACETAMINOPHEN 5-325 MG PO TABS: 1.0000 | ORAL_TABLET | Freq: Four times a day (QID) | ORAL | 0 refills | Status: DC | PRN

## 2016-05-19 MED ORDER — KETOROLAC TROMETHAMINE 15 MG/ML IJ SOLN
INTRAMUSCULAR | Status: AC
  Administered 2016-05-19: 15 mg
  Filled 2016-05-19: qty 1

## 2016-05-19 MED ORDER — ONDANSETRON 4 MG PO TBDP
4.0000 mg | ORAL_TABLET | Freq: Once | ORAL | Status: AC
  Administered 2016-05-19: 4 mg via ORAL
  Filled 2016-05-19: qty 1

## 2016-05-19 MED ORDER — MORPHINE SULFATE (PF) 4 MG/ML IV SOLN
4.0000 mg | Freq: Once | INTRAVENOUS | Status: AC
  Administered 2016-05-19: 4 mg via INTRAVENOUS
  Filled 2016-05-19: qty 1

## 2016-05-19 MED ORDER — PROMETHAZINE HCL 25 MG/ML IJ SOLN
25.0000 mg | Freq: Once | INTRAMUSCULAR | Status: AC
  Administered 2016-05-19: 25 mg via INTRAVENOUS
  Filled 2016-05-19: qty 1

## 2016-05-19 NOTE — ED Triage Notes (Signed)
Vomiting since MN, diarrhea x 2 days. Abdominal pain.

## 2016-05-19 NOTE — ED Notes (Signed)
Patient transported to Ultrasound 

## 2016-05-19 NOTE — ED Notes (Signed)
Assisted up to BR , unable to void. Will try again when IVF infused . Remains NPO

## 2016-05-19 NOTE — ED Notes (Signed)
Pt unable to give UA at this time 

## 2016-05-19 NOTE — ED Notes (Signed)
States has had LLQ pain x 2 days. Hx of left ovarian cyst but stays the pain feels different. Tender to palpation to LLQ

## 2016-05-19 NOTE — ED Notes (Signed)
Pt has had crackers and soda with no return of vomiting.

## 2016-05-19 NOTE — ED Provider Notes (Signed)
[facility] Provider Note   CSN: [id number] Arrival date & time: 05/19/16  1224     History   Chief Complaint Chief Complaint  Patient presents with  . Emesis    HPI Debra Shuttersicole Dase is a 41 y.o. female.  HPI Patient presents to the emergency department with a 2 day history of left lower abdominal pain.  Patient states she was recently hospitalized with an asthma exacerbations and on multiple steroids.  Patient states nothing seems make her condition better, but palpation makes the pain worse, states, that she has had no diarrhea but some nausea with no vomiting.  Patient states she did not take any medications prior to arrivalThe patient denies chest pain, shortness of breath, headache,blurred vision, neck pain, fever, cough, weakness, numbness, dizziness, anorexia, edema,  vomiting, diarrhea, rash, back pain, dysuria, hematemesis, bloody stool, near syncope, or syncope. Past Medical History:  Diagnosis Date  . Anemia    pernicious anemia. hx. as a child  . Anxiety   . Cardiac arrhythmia   . Chronic headaches   . GERD (gastroesophageal reflux disease)   . Hypothyroidism 01/17/2016  . Obesity   . Vasodepressor syncope     Patient Active Problem List   Diagnosis Date Noted  . Pernicious anemia 05/16/2016  . Morbid obesity (HCC) 05/16/2016  . Migraine without aura and without status migrainosus, not intractable   . Gastroesophageal reflux disease   . Reactive airway disease with acute exacerbation 05/13/2016  . Hypothyroidism 01/17/2016  . Left ovarian cyst 03/16/2014  . Abnormal weight gain 03/11/2014  . Sciatica 05/22/2011  . Diaphragmatic hernia 06/14/2010  . Adjustment disorder with depressed mood 07/12/2008  . 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 CHOLECYSTECTOMY  2004  . OVARIAN CYST REMOVAL Left   . WISDOM TOOTH EXTRACTION      OB History    Gravida Para Term Preterm AB Living   0 0 0 0 0 0   SAB TAB Ectopic Multiple Live Births   0 0 0 0 0       Home Medications    Prior to Admission medications   Medication Sig Start Date End Date Taking? Authorizing Provider  amoxicillin-clavulanate (AUGMENTIN) 875-125 MG tablet Take 1 tablet by mouth 2 (two) times daily. Patient taking differently: Take 1 tablet by mouth 2 (two) times daily. Started 01/30 for 10 days 05/06/16   Renee A Kuneff, DO  budesonide-formoterol (SYMBICORT) 160-4.5 MCG/ACT inhaler Inhale 2 puffs into the lungs 2 times daily at 12 noon and 4 pm. 05/14/16   Vassie Lollarlos Madera, MD  Cyanocobalamin (B-12) 1000 MCG CAPS Take 1 capsule by mouth daily. 05/15/16 06/14/16  Helane RimaErica Wallace, DO  FLUoxetine (PROZAC) 20 MG capsule Take 20 mg by mouth daily.    Historical Provider, MD  guaiFENesin-codeine (CHERATUSSIN AC) 100-10 MG/5ML syrup Take 10 mLs by mouth at bedtime as needed for cough or congestion. 05/15/16   Helane RimaErica Wallace, DO  ibuprofen (ADVIL,MOTRIN) 200 MG tablet Take 2-3 tablets (400-600 mg total) by mouth every 8 (eight) hours as needed for fever, headache, mild pain, moderate pain or cramping. 05/14/16   Vassie Lollarlos Madera, MD  Ipratropium-Albuterol (COMBIVENT RESPIMAT) 20-100 MCG/ACT AERS respimat Inhale 1 puff into the lungs every 6 (six) hours as needed for wheezing or shortness of breath. 05/14/16   Vassie Lollarlos Madera, MD  levothyroxine (SYNTHROID, LEVOTHROID) 50 MCG tablet Take 1 tablet (50 mcg total) by mouth daily. 04/11/16   Esperanza RichtersEdward Saguier, PA-C  loratadine-pseudoephedrine (CLARITIN-D 24 HOUR) 10-240 MG 24 hr tablet Take 1 tablet by mouth daily. 05/14/16   Vassie Loll, MD  midodrine (PROAMATINE) 5 MG tablet Take 5 mg by mouth 3 (three) times daily as needed (for low blood pressure (less than 105)).     Historical Provider, MD  norethindrone-ethinyl estradiol-iron (LOESTRIN FE 1.5/30) 1.5-30 MG-MCG tablet Take 1 tablet by mouth daily.    Historical Provider, MD  predniSONE (DELTASONE) 20 MG tablet Take 3 tablet by mouth daily X1 day; then 2  tablet by mouth daily X 2 days; then 1 tablet by mouth daily X 3 days; then 1/2 tablet by mouth daily X 3 days. 05/14/16   Vassie Loll, MD  ranitidine (ZANTAC) 150 MG capsule Take 1 capsule (150 mg total) by mouth at bedtime. Patient taking differently: Take 150 mg by mouth daily as needed for heartburn.  02/08/16   Rachael Fee, MD  SUMAtriptan (IMITREX) 100 MG tablet Take 1 tablet (100 mg total) by mouth every 12 (twelve) hours as needed (intractable migraine). May repeat in 2 hours if headache persists or recurs. 05/14/16   Vassie Loll, MD  SYMBICORT 80-4.5 MCG/ACT inhaler  05/13/16   Historical Provider, MD  VENTOLIN HFA 108 [phone] Base) MCG/ACT inhaler  05/06/16   Historical Provider, MD    Family History Family History  Problem Relation Age of Onset  . Thyroid disease Mother   . Heart disease Father   . Stroke Father   . Heart attack Father   . Thyroid disease Sister   . Lung cancer Maternal Grandfather   . Heart disease Maternal Grandfather   . Diabetes Paternal Grandmother   . Thyroid cancer Paternal Grandmother   . Heart disease Paternal Grandfather   . Breast cancer Paternal Aunt   . Bladder Cancer Paternal Aunt   . Thyroid disease Paternal Aunt   . Cancer Paternal Aunt     Mouth    Social History Social History  Substance Use Topics  . Smoking status: Never Smoker  . Smokeless tobacco: Never Used  . Alcohol use Yes     Comment: Occasionally     Allergies   Aspirin   Review of Systems Review of Systems All other systems negative except as documented in the HPI. All pertinent positives and negatives as reviewed in the HPI.  Physical Exam Updated Vital Signs BP 115/83 (BP Location: Right Arm)   Pulse 102   Temp 98.3 F (36.8 C) (Oral)   Resp 18   Ht 5' 6.5" (1.689 m)   Wt 107.5 kg   LMP 04/12/2016   SpO2 97%   BMI 37.66 kg/m   Physical Exam  Constitutional: She is oriented to person, place, and time. She appears well-developed and well-nourished. No  distress.  HENT:  Head: Normocephalic and atraumatic.  Mouth/Throat: Oropharynx is clear and moist.  Eyes: Pupils are equal, round, and reactive to light.  Neck: Normal range of motion. Neck supple.  Cardiovascular: Normal rate, regular rhythm and normal heart sounds.  Exam reveals no gallop and no friction rub.   No murmur heard. Pulmonary/Chest: Effort normal and breath sounds normal. No respiratory distress. She has no wheezes.  Abdominal: Soft. Normal appearance and bowel sounds are normal. She exhibits no distension and no mass. There is tenderness in the left lower quadrant. There is no rebound and no guarding.    Neurological: She is alert and oriented to person, place, and time. She exhibits normal muscle tone. Coordination normal.  Skin:  Skin is warm and dry. No rash noted. No erythema.  Psychiatric: She has a normal mood and affect. Her behavior is normal.  Nursing note and vitals reviewed.    ED Treatments / Results  Labs (all labs ordered are listed, but only abnormal results are displayed) Labs Reviewed  COMPREHENSIVE METABOLIC PANEL - Abnormal; Notable for the following:       Result Value   Chloride 99 (*)    AST 60 (*)    ALT 60 (*)    All other components within normal limits  CBC WITH DIFFERENTIAL/PLATELET - Abnormal; Notable for the following:    WBC 20.1 (*)    RBC 5.35 (*)    Neutro Abs 12.3 (*)    Lymphs Abs 6.6 (*)    Basophils Absolute 0.2 (*)    All other components within normal limits  URINALYSIS, ROUTINE W REFLEX MICROSCOPIC  PREGNANCY, URINE    EKG  EKG Interpretation None       Radiology No results found.  Procedures Procedures (including critical care time)  Medications Ordered in ED Medications  ondansetron (ZOFRAN-ODT) disintegrating tablet 4 mg (4 mg Oral Given 05/19/16 1249)  sodium chloride 0.9 % bolus 1,000 mL (0 mLs Intravenous Stopped 05/19/16 1623)  promethazine (PHENERGAN) injection 25 mg (25 mg Intravenous Given 05/19/16  1450)  morphine 4 MG/ML injection 4 mg (4 mg Intravenous Given 05/19/16 1450)  iopamidol (ISOVUE-300) 61 % injection 100 mL (100 mLs Intravenous Contrast Given 05/19/16 1640)     Initial Impression / Assessment and Plan / ED Course  I have reviewed the triage vital signs and the nursing notes.  Pertinent labs & imaging results that were available during my care of the patient were reviewed by me and considered in my medical decision making (see chart for details).  Clinical Course as of May 20 1320  Mon May 19, 2016  2004 WBC Morphology: MILD LEFT SHIFT (1-5% METAS, OCC MYELO, OCC BANDS) [AH]    Clinical Course User Index [AH] Arthor Captain, PA-C    The patient is awaiting CT scan. The patient has had ovarian issues in the past but felt that this was different than those issues. The patient will be signed out to Arthor Captain, PA-C for follow up on her remaining test.   Final Clinical Impressions(s) / ED Diagnoses   Final diagnoses:  None    New Prescriptions New Prescriptions   No medications on file     Charlestine Night, PA-C 05/20/16 1324    Melene Plan, DO 05/20/16 [phone]

## 2016-05-19 NOTE — ED Provider Notes (Signed)
5:39 PM: [facility]. Patient presented to the ED today with LLQ pain, nausea, vomiting. CT shows hydrosalpinx.  Pelvic exam: VULVA: normal appearing vulva with no masses, tenderness or lesions, VAGINA: normal appearing vagina with normal color and discharge, no lesions, CERVIX: normal appearing cervix without discharge or lesions, friable , ADNEXA: tenderness left.  Patient ultrasound images show cyst in the left adnexa.  No cmt and I doubt pid. patient will be d/c with symptomatic tx.  I have advised her on recommended follow up images.    Arthor Captainbigail Jeidy Hoerner, PA-C 05/20/16 1738    Lyndal Pulleyaniel Knott, MD 05/21/16 [phone]

## 2016-05-19 NOTE — ED Notes (Signed)
Unable to urinate

## 2016-05-19 NOTE — ED Notes (Signed)
Patient transported to CT 

## 2016-05-19 NOTE — Discharge Instructions (Signed)
You have a complex ovarian cyst that needs follow up  with your OB/GYN in 6-12 weeks for a repeat ultrasound. Abdominal (belly) pain can be caused by many things. Your caregiver performed an examination and possibly ordered blood/urine tests and imaging (CT scan, x-rays, ultrasound). Many cases can be observed and treated at home after initial evaluation in the emergency department. Even though you are being discharged home, abdominal pain can be unpredictable. Therefore, you need a repeated exam if your pain does not resolve, returns, or worsens. Most patients with abdominal pain don't have to be admitted to the hospital or have surgery, but serious problems like appendicitis and gallbladder attacks can start out as nonspecific pain. Many abdominal conditions cannot be diagnosed in one visit, so follow-up evaluations are very important. SEEK IMMEDIATE MEDICAL ATTENTION IF: The pain does not go away or becomes severe.  A temperature above 101 develops.  Repeated vomiting occurs (multiple episodes).  The pain becomes localized to portions of the abdomen. The right side could possibly be appendicitis. In an adult, the left lower portion of the abdomen could be colitis or diverticulitis.  Blood is being passed in stools or vomit (bright red or black tarry stools).  Return also if you develop chest pain, difficulty breathing, dizziness or fainting, or become confused, poorly responsive, or inconsolable (young children).

## 2016-05-19 NOTE — ED Notes (Signed)
Given snack and po fluids  

## 2016-05-20 LAB — GC/CHLAMYDIA PROBE AMP (~~LOC~~) NOT AT ARMC
Chlamydia: NEGATIVE
Neisseria Gonorrhea: NEGATIVE

## 2016-06-06 ENCOUNTER — Encounter: Payer: Self-pay | Admitting: Internal Medicine

## 2016-06-06 ENCOUNTER — Ambulatory Visit (INDEPENDENT_AMBULATORY_CARE_PROVIDER_SITE_OTHER): Payer: 59 | Admitting: Internal Medicine

## 2016-06-06 VITALS — BP 124/82 | HR 83 | Ht 66.5 in | Wt 242.0 lb

## 2016-06-06 DIAGNOSIS — J45991 Cough variant asthma: Secondary | ICD-10-CM

## 2016-06-06 MED ORDER — BUDESONIDE-FORMOTEROL FUMARATE 80-4.5 MCG/ACT IN AERO: 2.0000 | INHALATION_SPRAY | Freq: Two times a day (BID) | RESPIRATORY_TRACT | 0 refills | Status: DC

## 2016-06-06 MED ORDER — RANITIDINE HCL 150 MG PO CAPS: 150.0000 mg | ORAL_CAPSULE | Freq: Every day | ORAL | 11 refills | Status: DC

## 2016-06-06 MED ORDER — PANTOPRAZOLE SODIUM 40 MG PO TBEC: 40.0000 mg | DELAYED_RELEASE_TABLET | Freq: Every day | ORAL | 2 refills | Status: DC

## 2016-06-06 MED ORDER — BUDESONIDE-FORMOTEROL FUMARATE 80-4.5 MCG/ACT IN AERO: 2.0000 | INHALATION_SPRAY | Freq: Two times a day (BID) | RESPIRATORY_TRACT | 11 refills | Status: DC

## 2016-06-06 NOTE — Patient Instructions (Addendum)
Plan A = Automatic = Symbicort 80 Take 2 puffs first thing in am and then another 2 puffs about 12 hours later.    Work on inhaler technique:  relax and gently blow all the way out then take a nice smooth deep breath back in, triggering the inhaler at same time you start breathing in.  Hold for up to 5 seconds if you can. Blow out thru nose. Rinse and gargle with water when done      Plan B = Backup Only use your combivent as a rescue medication to be used if you can't catch your breath by resting or doing a relaxed purse lip breathing pattern.  - The less you use it, the better it will work when you need it. - Ok to use the inhaler up to 1 puffs  every 4 hours if you must but call for appointment if use goes up over your usual need - Don't leave home without it !!  (think of it like the spare tire for your car)   Pantoprazole (protonix) 40 mg   Take  30-60 min before first meal of the day and Pepcid (famotidine)  20 mg one @  bedtime until return to office - this is the best way to tell whether stomach acid is contributing to your problem.    GERD (REFLUX)  is an extremely common cause of respiratory symptoms just like yours , many times with no obvious heartburn at all.    It can be treated with medication, but also with lifestyle changes including elevation of the head of your bed (ideally with 6 inch  bed blocks),  Smoking cessation, avoidance of late meals, excessive alcohol, and avoid fatty foods, chocolate, peppermint, colas, red wine, and acidic juices such as orange juice.  NO MINT OR MENTHOL PRODUCTS SO NO COUGH DROPS  USE SUGARLESS CANDY INSTEAD (Jolley ranchers or Stover's or Life Savers) or even ice chips will also do - the key is to swallow to prevent all throat clearing. NO OIL BASED VITAMINS - use powdered substitutes.    Please schedule a follow up office visit in 4 weeks, sooner if needed

## 2016-06-06 NOTE — Progress Notes (Signed)
Subjective:     Patient ID: Debra West, female   DOB: 02/28/1976,    MRN: [id number]  HPI  64 yowf never smoker life long stuffy or runny nose but esp bad in spring /summer eyes itching/sneezing with occ sob but no need for inhalers able to play volley ball / tennis then while in college in [facility] allergy eval neg/ take saba prn then 2000 p exp to tarred roof had first "asthma attack"  in 2014 while in [place] [employer] ? Viral uri flared up/refractory over 2 days whereas was not using albuterol at all prior, then it stopped working /win 48 h and ended up hospitalized x one week >  100% better with short term singuliar only and  no other maint rx then exact same scenario 2016 > UC then to allergist and again neg w/u  > increased alb and no maint rx then [place] 11/2016 and did ok on no alb then again  same   scenario cold > chest > 3 x er/ 1 overnight > d/c 05/14/16 then back to ER so referred to pulmonary clinic 06/06/2016 by Helane Rima     06/06/2016 1st [facility] office visit/ Lauri Purdum  Atypical asthma  Chief Complaint  Patient presents with  . Pulmonary Consult    Referred by Dr. Helane Rima. Pt c/o SOB and cough since Feb 2018. She also has some chest tightness. Her cough is non prod at this time.   cough to vomit with last flare  Onset of asthma flares may have been with initiation  of bcps Says 90% better now finishing pred/ no longer needing saba on symb 160 2bid but still sob with more than slow adls and sense of throat and chest "congestion" and tightness 24/7 but does not wake her up / worse on exp to cold air   No obvious day to day or daytime variability or assoc excess/ purulent sputum or mucus plugs or hemoptysis or cp  subjective wheeze or overt sinus or hb symptoms. No unusual exp hx or h/o childhood pna/ asthma or knowledge of premature birth.  Sleeping ok without nocturnal  or early am exacerbation  of respiratory  c/o's or need for noct saba. Also denies any obvious  fluctuation of symptoms with weather or environmental changes or other aggravating or alleviating factors except as outlined above   Current Medications, Allergies, Complete Past Medical History, Past Surgical History, Family History, and Social History were reviewed in [employer] record.  ROS  The following are not active complaints unless bolded sore throat, dysphagia, dental problems, itching, sneezing,  nasal congestion or excess/ purulent secretions, ear ache,   fever, chills, sweats, unintended wt loss, classically pleuritic or exertional cp,  orthopnea pnd or leg swelling, presyncope, palpitations, abdominal pain, anorexia, nausea, vomiting, diarrhea  or change in bowel or bladder habits, change in stools or urine, dysuria,hematuria,  rash, arthralgias, visual complaints, headache, numbness, weakness or ataxia or problems with walking or coordination,  change in mood/affect or memory.           Review of Systems     Objective:   Physical Exam    obese amb wf nad   Wt Readings from Last 3 Encounters:  06/06/16 242 lb (109.8 kg)  05/19/16 236 lb 14.4 oz (107.5 kg)  05/15/16 246 lb 3.2 oz (111.7 kg)    Vital signs reviewed - Note on arrival 02 sats  96% on RA     HEENT: nl  dentition,  and oropharynx. Nl external ear canals without cough reflex - moderate bilateral non-specific turbinate edema     NECK :  without JVD/Nodes/TM/ nl carotid upstrokes bilaterally   LUNGS: no acc muscle use,  Nl contour chest which is clear to A and P bilaterally without cough on insp or exp maneuvers   CV:  RRR  no s3 or murmur or increase in P2, and no edema   ABD:  soft and nontender with nl inspiratory excursion in the supine position. No bruits or organomegaly appreciated, bowel sounds nl  MS:  Nl gait/ ext warm without deformities, calf tenderness, cyanosis or clubbing No obvious joint restrictions   SKIN: warm and dry without lesions    NEURO:  alert, approp,  nl sensorium with  no motor or cerebellar deficits apparent.       I personally reviewed images and agree with radiology impression as follows:  CXR:   05/13/16 No acute disease.  Labs 06/06/2016   FENO  = 6   Assessment:       Outpatient Encounter Prescriptions as of 06/06/2016  Medication Sig  . Cyanocobalamin (B-12) 1000 MCG CAPS Take 1 capsule by mouth daily.  Marland Kitchen. FLUoxetine (PROZAC) 20 MG capsule Take 20 mg by mouth daily.  Marland Kitchen. ibuprofen (ADVIL,MOTRIN) 200 MG tablet Take 2-3 tablets (400-600 mg total) by mouth every 8 (eight) hours as needed for fever, headache, mild pain, moderate pain or cramping.  . Ipratropium-Albuterol (COMBIVENT RESPIMAT) 20-100 MCG/ACT AERS respimat Inhale 1 puff into the lungs every 6 (six) hours as needed for wheezing or shortness of breath.  . levothyroxine (SYNTHROID, LEVOTHROID) 50 MCG tablet Take 1 tablet (50 mcg total) by mouth daily.  . midodrine (PROAMATINE) 5 MG tablet Take 5 mg by mouth 3 (three) times daily as needed (for low blood pressure (less than 105)).   Marland Kitchen. norethindrone-ethinyl estradiol-iron (LOESTRIN FE 1.5/30) 1.5-30 MG-MCG tablet Take 1 tablet by mouth daily.  . ondansetron (ZOFRAN) 4 MG tablet Take 1 tablet (4 mg total) by mouth every 8 (eight) hours as needed for nausea or vomiting.  . ranitidine (ZANTAC) 150 MG capsule Take 1 capsule (150 mg total) by mouth at bedtime.  . SUMAtriptan (IMITREX) 100 MG tablet Take 1 tablet (100 mg total) by mouth every 12 (twelve) hours as needed (intractable migraine). May repeat in 2 hours if headache persists or recurs.  . [DISCONTINUED] budesonide-formoterol (SYMBICORT) 160-4.5 MCG/ACT inhaler Inhale 2 puffs into the lungs 2 times daily at 12 noon and 4 pm. (Patient taking differently: Inhale 2 puffs into the lungs 2 (two) times daily as needed. )  . [DISCONTINUED] loratadine-pseudoephedrine (CLARITIN-D 24 HOUR) 10-240 MG 24 hr tablet Take 1 tablet by mouth daily.  . [DISCONTINUED] ranitidine (ZANTAC) 150 MG  capsule Take 1 capsule (150 mg total) by mouth at bedtime. (Patient taking differently: Take 150 mg by mouth daily as needed for heartburn. )  . pantoprazole (PROTONIX) 40 MG tablet Take 1 tablet (40 mg total) by mouth daily. Take 30-60 min before first meal of the day  . [DISCONTINUED] amoxicillin-clavulanate (AUGMENTIN) 875-125 MG tablet Take 1 tablet by mouth 2 (two) times daily. (Patient taking differently: Take 1 tablet by mouth 2 (two) times daily. Started 01/30 for 10 days)  . [DISCONTINUED] budesonide-formoterol (SYMBICORT) 80-4.5 MCG/ACT inhaler Inhale 2 puffs into the lungs 2 (two) times daily.  . [DISCONTINUED] budesonide-formoterol (SYMBICORT) 80-4.5 MCG/ACT inhaler Inhale 2 puffs into the lungs 2 (two) times daily.  . [DISCONTINUED] guaiFENesin-codeine (CHERATUSSIN AC)  100-10 MG/5ML syrup Take 10 mLs by mouth at bedtime as needed for cough or congestion.  . [DISCONTINUED] HYDROcodone-acetaminophen (NORCO) 5-325 MG tablet Take 1-2 tablets by mouth every 6 (six) hours as needed. (Patient not taking: Reported on 06/06/2016)  . [DISCONTINUED] predniSONE (DELTASONE) 20 MG tablet Take 3 tablet by mouth daily X1 day; then 2 tablet by mouth daily X 2 days; then 1 tablet by mouth daily X 3 days; then 1/2 tablet by mouth daily X 3 days.  . [DISCONTINUED] SYMBICORT 80-4.5 MCG/ACT inhaler   . [DISCONTINUED] VENTOLIN HFA 108 (90 Base) MCG/ACT inhaler    No facility-administered encounter medications on file as of 06/06/2016.

## 2016-06-07 NOTE — Assessment & Plan Note (Addendum)
Spirometry 06/06/2016   wnl after symbicort 160 x 2 with FENO  = 6 p pred rx complete - 06/06/2016  After extensive coaching HFA effectiveness =    90% try symbicort 80 2 bid   DDX of  difficult airways management almost all start with A and  include Adherence, Ace Inhibitors, Acid Reflux, Active Sinus Disease, Alpha 1 Antitripsin deficiency, Anxiety masquerading as Airways dz,  ABPA,  Allergy(esp in young), Aspiration (esp in elderly), Adverse effects of meds,  Active smokers, A bunch of PE's (a small clot burden can't cause this syndrome unless there is already severe underlying pulm or vascular dz with poor reserve) plus two Bs  = Bronchiectasis and Beta blocker use..and one C= CHF  Adherence is always the initial "prime suspect" and is a multilayered concern that requires a "trust but verify" approach in every patient - starting with knowing how to use medications, especially inhalers, correctly, keeping up with refills and understanding the fundamental difference between maintenance and prns vs those medications only taken for a very short course and then stopped and not refilled.   ? Acid (or non-acid) GERD > always difficult to exclude as up to 75% of pts in some series report no assoc GI/ Heartburn symptoms - note she coughs to point of vomiting so def as reflux and her atypical asthma spells started p she started bcps that relax the LES > rec max (24h)  acid suppression and diet restrictions/ reviewed and instructions given in writing.  - need to consider BCP with lower dose progesterone  ? Adverse effect of meds > high dose ICS may be contraindicated in setting of possible UACS > rec try symb 80 2bid   ? Anxiety > usually at the bottom of this list of usual suspects but should be much higher on this pt's based on H and P and note already on psychotropics  > Follow up per Primary Care planned    ? Allergy > two prev allergy evals neg / no response to singulair so no need to pursue this now  ?  Active sinus dz > consider sinus CT next ov  ? ABPA > consider check IgE off pred for more than 2 weeks   Total time devoted to counseling  > 50 % of initial 60 min office visit:  review case with pt/ discussion of options/alternatives/ personally creating written customized instructions  in presence of pt  then going over those specific  Instructions directly with the pt including how to use all of the meds but in particular covering each new medication in detail and the difference between the maintenance= "automatic" meds and the prns using an action plan format for the latter (If this problem/symptom => do that organization reading Left to right).  Please see AVS from this visit for a full list of these instructions which I personally wrote for this pt and  are unique to this visit.

## 2016-06-07 NOTE — Assessment & Plan Note (Signed)
Body mass index is 38.47 kg/m.  trending up/ need to avoid pred here if possible  Lab Results  Component Value Date   TSH 2.77 03/24/2016     Contributing to gerd risk/ doe/reviewed the need and the process to achieve and maintain neg calorie balance > defer f/u primary care including intermittently monitoring thyroid status

## 2016-06-09 LAB — NITRIC OXIDE: NITRIC OXIDE: 6

## 2016-06-09 NOTE — Addendum Note (Signed)
Addended by: Christen ButterASKIN, Rozalynn Buege M on: 06/09/2016 09:29 AM   Modules accepted: Orders

## 2016-07-01 ENCOUNTER — Encounter: Payer: Self-pay | Admitting: Emergency Medicine

## 2016-07-01 ENCOUNTER — Encounter: Payer: Self-pay | Admitting: Family Medicine

## 2016-07-01 ENCOUNTER — Ambulatory Visit (INDEPENDENT_AMBULATORY_CARE_PROVIDER_SITE_OTHER): Payer: 59 | Admitting: Family Medicine

## 2016-07-01 VITALS — BP 128/86 | HR 90 | Temp 98.6°F | Wt 243.8 lb

## 2016-07-01 DIAGNOSIS — J111 Influenza due to unidentified influenza virus with other respiratory manifestations: Secondary | ICD-10-CM

## 2016-07-01 DIAGNOSIS — J4541 Moderate persistent asthma with (acute) exacerbation: Secondary | ICD-10-CM

## 2016-07-01 DIAGNOSIS — R69 Illness, unspecified: Secondary | ICD-10-CM | POA: Diagnosis not present

## 2016-07-01 LAB — POCT INFLUENZA A/B
Influenza A, POC: NEGATIVE
Influenza B, POC: NEGATIVE

## 2016-07-01 MED ORDER — PREDNISONE 10 MG PO TABS: ORAL_TABLET | ORAL | 0 refills | Status: DC

## 2016-07-01 MED ORDER — GUAIFENESIN-CODEINE 100-10 MG/5ML PO SYRP: 10.0000 mL | ORAL_SOLUTION | Freq: Every evening | ORAL | 0 refills | Status: DC | PRN

## 2016-07-01 MED ORDER — OSELTAMIVIR PHOSPHATE 75 MG PO CAPS: 75.0000 mg | ORAL_CAPSULE | Freq: Two times a day (BID) | ORAL | 0 refills | Status: DC

## 2016-07-01 NOTE — Progress Notes (Signed)
Pre visit review using our clinic review tool, if applicable. No additional management support is needed unless otherwise documented below in the visit note. 

## 2016-07-01 NOTE — Progress Notes (Signed)
Debra West is a 41 y.o. female here for:  History of Present Illness:   Debra West CMA acting as scribe for Dr. Earlene PlaterWallace.  HPI: Influenza like symptoms: Symptoms include chills, dry cough, headache, myalgias, post nasal drip and fever and have been present for 1 day. She has tried to alleviate the symptoms with acetaminophen and rest with minimal relief. High risk factors for influenza complications: co-morbid illness.  The pt has had a flu shot. The patient hasa history of asthma. She has a direct contact that was diagnosed with the flu today.  PMHx, SurgHx, SocialHx, Medications, and Allergies were reviewed in the Visit Navigator and updated as appropriate.  Current Medications:   .  FLUoxetine (PROZAC) 20 MG capsule, Take 20 mg by mouth daily., Disp: , Rfl:  .  ibuprofen (ADVIL,MOTRIN) 200 MG tablet, Take 2-3 tablets (400-600 mg total) by mouth every 8 (eight) hours as needed for fever, headache, mild pain, moderate pain or cramping., Disp: , Rfl:  .  Ipratropium-Albuterol (COMBIVENT RESPIMAT) 20-100 MCG/ACT AERS respimat, Inhale 1 puff into the lungs every 6 (six) hours as needed for wheezing or shortness of breath., Disp: 1 Inhaler, Rfl: 2 .  levothyroxine (SYNTHROID, LEVOTHROID) 50 MCG tablet, Take 1 tablet (50 mcg total) by mouth daily., Disp: 90 tablet, Rfl: 0 .  midodrine (PROAMATINE) 5 MG tablet, Take 5 mg by mouth 3 (three) times daily as needed (for low blood pressure (less than 105)). , Disp: , Rfl:  .  norethindrone-ethinyl estradiol-iron (LOESTRIN FE 1.5/30) 1.5-30 MG-MCG tablet, Take 1 tablet by mouth daily., Disp: , Rfl:  .  pantoprazole (PROTONIX) 40 MG tablet, Take 1 tablet (40 mg total) by mouth daily. Take 30-60 min before first meal of the day, Disp: 30 tablet, Rfl: 2 .  ranitidine (ZANTAC) 150 MG capsule, Take 1 capsule (150 mg total) by mouth at bedtime., Disp: 30 capsule, Rfl: 11 .  SUMAtriptan (IMITREX) 100 MG tablet, Take 1 tablet (100 mg total) by mouth every 12  (twelve) hours as needed (intractable migraine). May repeat in 2 hours if headache persists or recurs., Disp: 12 tablet, Rfl: 0   Review of Systems:   Review of Systems  Constitutional: Positive for chills, fever and malaise/fatigue.       Tmax 101.  HENT: Positive for congestion, sinus pain and sore throat.   Respiratory: Positive for cough and shortness of breath.        SOB when patient is coughing or when she needs to cough.  Cardiovascular: Negative for chest pain, palpitations and leg swelling.  Gastrointestinal: Positive for abdominal pain, constipation and diarrhea.  Genitourinary: Negative for dysuria.  Musculoskeletal: Positive for neck pain.  Neurological: Positive for dizziness and headaches.       Dizziness when she sneezes.  Psychiatric/Behavioral: Negative for depression, hallucinations and memory loss.    Vitals:   Vitals:   07/01/16 1410  BP: 128/86  Pulse: 90  Temp: 98.6 F (37 C)  TempSrc: Oral  SpO2: 97%  Weight: 243 lb 12.8 oz (110.6 kg)     Body mass index is 38.76 kg/m.  Physical Exam:   Physical Exam  Constitutional: She appears well-nourished.  HENT:  Head: Normocephalic and atraumatic.  Right Ear: Tympanic membrane normal.  Left Ear: Tympanic membrane normal.  Nose: Rhinorrhea present.  Mouth/Throat: Posterior oropharyngeal edema present.  Eyes: EOM are normal. Pupils are equal, round, and reactive to light.  Neck: Normal range of motion. Neck supple.  Cardiovascular: Normal rate, regular rhythm,  normal heart sounds and intact distal pulses.   Pulmonary/Chest: Effort normal. No respiratory distress. She has wheezes.  Abdominal: Soft.  Skin: Skin is warm.  Psychiatric: She has a normal mood and affect. Her behavior is normal.  Nursing note and vitals reviewed.   Assessment and Plan:    Cleda was seen today for sore throat and nasal congestion.  Diagnoses and all orders for this visit:  Moderate persistent asthma with  exacerbation -     predniSONE (DELTASONE) 10 MG tablet; 4-4-4-3-3-3-2-2-2-1-1-1-1/2-1/2-off -     guaiFENesin-codeine (CHERATUSSIN AC) 100-10 MG/5ML syrup; Take 10 mLs by mouth at bedtime as needed for cough or congestion.  Influenza-like illness -     POC Influenza A/B - negative -     oseltamivir (TAMIFLU) 75 MG capsule; Take 1 capsule (75 mg total) by mouth 2 (two) times daily.   . Reviewed expectations re: course of current medical issues. . Discussed self-management of symptoms. . Outlined signs and symptoms indicating need for more acute intervention. . Patient verbalized understanding and all questions were answered. . See orders for this visit as documented in the electronic medical record. . Patient received an After-Visit Summary.  CMA served as Neurosurgeon during this visit. History, Physical, and Plan performed by medical provider. Documentation and orders reviewed and attested to. Helane Rima, D.O.   Helane Rima, D.O.

## 2016-07-02 ENCOUNTER — Telehealth: Payer: Self-pay | Admitting: Family Medicine

## 2016-07-02 NOTE — Telephone Encounter (Signed)
Patient notified of flu test results and verbalized understanding.

## 2016-07-02 NOTE — Telephone Encounter (Signed)
Patient called to get lab results. Please call patient to advise. Okay to leave a detailed message on phone.

## 2016-07-14 ENCOUNTER — Ambulatory Visit (INDEPENDENT_AMBULATORY_CARE_PROVIDER_SITE_OTHER): Payer: 59 | Admitting: Internal Medicine

## 2016-07-14 ENCOUNTER — Encounter: Payer: Self-pay | Admitting: Internal Medicine

## 2016-07-14 VITALS — BP 110/80 | HR 84 | Ht 66.5 in | Wt 245.0 lb

## 2016-07-14 DIAGNOSIS — J45991 Cough variant asthma: Secondary | ICD-10-CM | POA: Diagnosis not present

## 2016-07-14 NOTE — Progress Notes (Signed)
Subjective:     Patient ID: Debra West, female   DOB: [birth year],    MRN: [id number]    Brief patient profile:  77 yowf never smoker life long stuffy or runny nose but esp bad in spring /summer eyes itching/sneezing with occ sob but no need for inhalers able to play volley ball / tennis then while in college in [facility] allergy eval neg/ take saba prn then 2000 p exp to tarred roof had first "asthma attack"  in 2014 while in [place] [employer] ? Viral uri flared up/refractory over 2 days whereas was not using albuterol at all prior, then it stopped working /win 48 h and ended up hospitalized x one week >  100% better with short term singuliar only and  no other maint rx then exact same scenario 2016 > UC then to allergist and again neg w/u  > increased alb and no maint rx then [place] 11/2016 and did ok on no alb then again  same   scenario cold > chest > 3 x er/ 1 overnight > d/c 05/14/16 then back to ER so referred to pulmonary clinic 06/06/2016 by Helane Rima    History of Present Illness  06/06/2016 1st [facility] office visit/ Audrianna Driskill  Atypical asthma  Chief Complaint  Patient presents with  . Pulmonary Consult    Referred by Dr. Helane Rima. Pt c/o SOB and cough since Feb 2018. She also has some chest tightness. Her cough is non prod at this time.   cough to vomit with last flare  Onset of asthma flares may have been with initiation  of bcps Says 90% better now finishing pred/ no longer needing saba on symb 160 2bid but still sob with more than slow adls and sense of throat and chest "congestion" and tightness 24/7 but does not wake her up / worse on exp to cold air  rec Plan A = Automatic = Symbicort 80 Take 2 puffs first thing in am and then another 2 puffs about 12 hours later.  Work on inhaler technique:  relax and gently blow all the way out then take a nice smooth deep breath back in, triggering the inhaler at same time you start breathing in.  Hold for up to 5 seconds if you  can. Blow out thru nose. Rinse and gargle with water when done Plan B = Backup Only use your combivent as a rescue medication to be used if you can't catch your breath  Pantoprazole (protonix) 40 mg   Take  30-60 min before first meal of the day and Pepcid (famotidine)  20 mg one @  bedtime until return to office - this is the best way to tell whether stomach acid is contributing to your problem.   GERD diet    07/14/2016  f/u ov/Roena Sassaman re:  Cough variant asthma vs UACS on symb 80 2 bid  Chief Complaint  Patient presents with  . Follow-up    Pt developed the flu end of March 2018 and her SOB, cough and chest tightness has been worse since then.  Her cough is occ prod with white sputum.     felt fine on trip to  [facility] but when returned then next day which was March 24th 2018 aching all over, dry cough, fever > 102 seen by PCP 07/01/16 rx tamiflu/ prednisone for trouble breathing  >  Aching gone, fever gone, sore throat gone, cough is 50% improved but still bad esp around supper / mucus  is white now min volume - using avg one combivent daily, makes her shaky if uses more  Prednisone completed one day prior to OV    Doe = MMRC2 = can't walk a nl pace on a flat grade s sob but does fine slow and flat     No obvious day to day or daytime variability or assoc ongoing  excess/ purulent sputum or mucus plugs or hemoptysis or cp   subjective wheeze or overt sinus or hb symptoms. No unusual exp hx or h/o childhood pna/ asthma or knowledge of premature birth.  Sleeping ok without nocturnal  or early am exacerbation  of respiratory  c/o's or need for noct saba. Also denies any obvious fluctuation of symptoms with weather or environmental changes or other aggravating or alleviating factors except as outlined above   Current Medications, Allergies, Complete Past Medical History, Past Surgical History, Family History, and Social History were reviewed in [employer] record.  ROS  The  following are not active complaints unless bolded sore throat, dysphagia, dental problems, itching, sneezing,  nasal congestion or excess/ purulent secretions, ear ache,   fever, chills, sweats, unintended wt loss, classically pleuritic or exertional cp,  orthopnea pnd or leg swelling, presyncope, palpitations, abdominal pain, anorexia, nausea, vomiting, diarrhea  or change in bowel or bladder habits, change in stools or urine, dysuria,hematuria,  rash, arthralgias, visual complaints, headache, numbness, weakness or ataxia or problems with walking or coordination,  change in mood/affect or memory.             Objective:   Physical Exam    obese amb wf nad  prominent throat clearing    07/14/2016          245   06/06/16 242 lb (109.8 kg)  05/19/16 236 lb 14.4 oz (107.5 kg)  05/15/16 246 lb 3.2 oz (111.7 kg)    Vital signs reviewed - Note on arrival 02 sats  96% on RA     HEENT: nl dentition,  and oropharynx which is pristine. Nl external ear canals without cough reflex - moderate bilateral non-specific turbinate edema     NECK :  without JVD/Nodes/TM/ nl carotid upstrokes bilaterally   LUNGS: no acc muscle use,  Nl contour chest with pseudowheezing only - otherwise clear with no cough on insp/exp   CV:  RRR  no s3 or murmur or increase in P2, and no edema   ABD:  soft and nontender with nl inspiratory excursion in the supine position. No bruits or organomegaly appreciated, bowel sounds nl  MS:  Nl gait/ ext warm without deformities, calf tenderness, cyanosis or clubbing No obvious joint restrictions   SKIN: warm and dry without lesions    NEURO:  alert, approp, nl sensorium with  no motor or cerebellar deficits apparent.       I personally reviewed images and agree with radiology impression as follows:  CXR:   05/13/16 No acute disease.  Labs 06/06/2016   FENO  = 6   Assessment:

## 2016-07-14 NOTE — Patient Instructions (Addendum)
Any time you have a flare of respiratory symptoms go ahead and take the protonix Take 30- 60 min before your first and last meals of the day and drop back to once daily before    Plan A = Automatic = Symbicort 80 Take 2 puffs first thing in am and then another 2 puffs about 12 hours later.    Work on inhaler technique:  relax and gently blow all the way out then take a nice smooth deep breath back in, triggering the inhaler at same time you start breathing in.  Hold for up to 5 seconds if you can. Blow out thru nose. Rinse and gargle with water when done      Plan B = Backup Only use your combivent as a rescue medication to be used if you can't catch your breath by resting or doing a relaxed purse lip breathing pattern.  - The less you use it, the better it will work when you need it. - Ok to use the inhaler up to 1 puffs  every 4 hours if you must but call for appointment if use goes up over your usual need - Don't leave home without it !!  (think of it like the spare tire for your car)   Pantoprazole (protonix) 40 mg   Take  30-60 min before first meal of the day and Pepcid (famotidine)  20 mg one @  bedtime until return to office - this is the best way to tell whether stomach acid is contributing to your problem.    GERD (REFLUX)  is an extremely common cause of respiratory symptoms just like yours , many times with no obvious heartburn at all.    It can be treated with medication, but also with lifestyle changes including elevation of the head of your bed (ideally with 6 inch  bed blocks),  Smoking cessation, avoidance of late meals, excessive alcohol, and avoid fatty foods, chocolate, peppermint, colas, red wine, and acidic juices such as orange juice.  NO MINT OR MENTHOL PRODUCTS SO NO COUGH DROPS  USE SUGARLESS CANDY INSTEAD (Jolley ranchers or Stover's or Life Savers) or even ice chips will also do - the key is to swallow to prevent all throat clearing. NO OIL BASED VITAMINS - use  powdered substitutes.    Please schedule a follow up office visit in 4 weeks, sooner if needed         GERD (REFLUX)  is an extremely common cause of respiratory symptoms just like yours , many times with no obvious heartburn at all.    It can be treated with medication, but also with lifestyle changes including elevation of the head of your bed (ideally with 6 inch  bed blocks),  Smoking cessation, avoidance of late meals, excessive alcohol, and avoid fatty foods, chocolate, peppermint, colas, red wine, and acidic juices such as orange juice.  NO MINT OR MENTHOL PRODUCTS SO NO COUGH DROPS   USE SUGARLESS CANDY INSTEAD (Jolley ranchers or Stover's or Life Savers) or even ice chips will also do - the key is to swallow to prevent all throat clearing. NO OIL BASED VITAMINS - use powdered substitutes.  Use the cough medication as much as you need to stop the urge to clear your throat if the candy doesn't suffice  For congestion of nose and throat try mucinex D  Consider alternative to your present birth control pill that less progesterone  Please schedule a follow up visit in 3 months but call  sooner if needed

## 2016-07-15 ENCOUNTER — Telehealth: Payer: Self-pay | Admitting: Internal Medicine

## 2016-07-15 ENCOUNTER — Encounter: Payer: Self-pay | Admitting: Internal Medicine

## 2016-07-15 NOTE — Assessment & Plan Note (Signed)
Spirometry 06/06/2016   wnl after symbicort 160 x 2 with FENO  = 6 p pred rx complete - 06/06/2016   try reduce symbicort 80 2 bid  - 07/14/2016  After extensive coaching HFA effectiveness =    90% with SMI (combivent)   Flare with obvious viral illness and persistent sense of throat and chest congestion but just pseudowheeze on exam at this point having just finished a course of pred and maint on adequate doses of symbicort (with higher doses likely to exacerbate the upper airway symptoms that predominate here  Of the three most common causes of  Sub-acute or recurrent or chronic cough, only one (GERD)  can actually contribute to/ trigger  the other two (asthma and post nasal drip syndrome)  and perpetuate the cylce of cough.  While not intuitively obvious, many patients with chronic low grade reflux do not cough until there is a primary insult that disturbs the protective epithelial barrier and exposes sensitive nerve endings.   This is typically viral but can be direct physical injury such as with an endotracheal tube.   The point is that once this occurs, it is difficult to eliminate the cycle  using anything but a maximally effective acid suppression regimen at least in the short run, accompanied by an appropriate diet to address non acid GERD and control / eliminate the cough itself for at least 3 days.   I had an extended discussion with the patient reviewing all relevant studies completed to date and  lasting 15 to 20 minutes of a 25 minute visit    Each maintenance medication was reviewed in detail including most importantly the difference between maintenance and prns and under what circumstances the prns are to be triggered using an action plan format that is not reflected in the computer generated alphabetically organized AVS.    Please see AVS for specific instructions unique to this visit that I personally wrote and verbalized to the the pt in detail and then reviewed with pt  by my nurse  highlighting any  changes in therapy recommended at today's visit to their plan of care.

## 2016-07-15 NOTE — Assessment & Plan Note (Signed)
Body mass index is 38.95 kg/m.  trending up Lab Results  Component Value Date   TSH 2.77 03/24/2016     Contributing to gerd risk/ doe/reviewed the need and the process to achieve and maintain neg calorie balance > defer f/u primary care including intermittently monitoring thyroid status

## 2016-07-15 NOTE — Telephone Encounter (Signed)
lmtcb for pt.  

## 2016-07-16 NOTE — Telephone Encounter (Signed)
To avoid strong smells, fumes, dust at all times

## 2016-07-16 NOTE — Telephone Encounter (Signed)
Pt returning call.Debra West ° °

## 2016-07-16 NOTE — Telephone Encounter (Signed)
Patient returned phone call.. Patient does not have good phone reception in the building, ok to leave detail message..Patient contact # [phone].Charm Rings

## 2016-07-16 NOTE — Telephone Encounter (Signed)
lmomtcb x 2 for the pt.  

## 2016-07-16 NOTE — Telephone Encounter (Signed)
Letter has been written per MW.  Pt is aware and states that she will print letter off of mychart. Nothing further needed.

## 2016-07-16 NOTE — Telephone Encounter (Signed)
We keep playing phone tag with her  MW- sounds like she is requesting a letter stating what her "breathing triggers are"  She is working around smells that are strong and this bothers her  Please advise if you want to do a letter for the pt and if so, what do you want it to say? Thanks

## 2016-07-17 ENCOUNTER — Encounter: Payer: Self-pay | Admitting: Internal Medicine

## 2016-08-06 ENCOUNTER — Other Ambulatory Visit: Payer: Self-pay | Admitting: Medical

## 2016-08-08 NOTE — Telephone Encounter (Signed)
[id number] - [place], [place] - [address] checking on the status of 08/06/16 levothyroxine (SYNTHROID, LEVOTHROID) 50 MCG tablet medication refill request,please advise

## 2016-08-09 ENCOUNTER — Encounter: Payer: Self-pay | Admitting: Family Medicine

## 2016-08-09 ENCOUNTER — Telehealth: Payer: Self-pay | Admitting: Medical

## 2016-08-10 ENCOUNTER — Other Ambulatory Visit: Payer: Self-pay | Admitting: Family Medicine

## 2016-08-11 ENCOUNTER — Telehealth: Payer: Self-pay | Admitting: Family Medicine

## 2016-08-11 MED ORDER — LEVOTHYROXINE SODIUM 50 MCG PO TABS
50.0000 ug | ORAL_TABLET | Freq: Every day | ORAL | 0 refills | Status: DC
Start: 2016-08-11 — End: 2016-11-12

## 2016-08-11 NOTE — Telephone Encounter (Signed)
Okay to refill? 

## 2016-08-11 NOTE — Telephone Encounter (Signed)
RX refilled and sent to pharmacy 

## 2016-08-11 NOTE — Telephone Encounter (Signed)
Please advise on refill. Historical provider.  

## 2016-08-11 NOTE — Telephone Encounter (Signed)
Called and left message for patient to reschedule appointment on Friday 5/11 due to Dr. Earlene PlaterWallace being out of the office. Awaiting patient's call back.

## 2016-08-15 ENCOUNTER — Ambulatory Visit (INDEPENDENT_AMBULATORY_CARE_PROVIDER_SITE_OTHER): Payer: 59 | Admitting: Family Medicine

## 2016-08-15 ENCOUNTER — Ambulatory Visit (INDEPENDENT_AMBULATORY_CARE_PROVIDER_SITE_OTHER)
Admission: RE | Admit: 2016-08-15 | Discharge: 2016-08-15 | Disposition: A | Discharge status: Home / Self Care | Payer: 59 | Source: Ambulatory Visit | Attending: Internal Medicine | Admitting: Internal Medicine

## 2016-08-15 ENCOUNTER — Encounter: Payer: Self-pay | Admitting: Family Medicine

## 2016-08-15 ENCOUNTER — Encounter: Payer: Self-pay | Admitting: Internal Medicine

## 2016-08-15 ENCOUNTER — Ambulatory Visit: Payer: 59 | Admitting: Family Medicine

## 2016-08-15 ENCOUNTER — Other Ambulatory Visit (INDEPENDENT_AMBULATORY_CARE_PROVIDER_SITE_OTHER): Payer: 59

## 2016-08-15 ENCOUNTER — Ambulatory Visit (INDEPENDENT_AMBULATORY_CARE_PROVIDER_SITE_OTHER): Payer: 59 | Admitting: Internal Medicine

## 2016-08-15 ENCOUNTER — Ambulatory Visit: Payer: 59 | Admitting: Internal Medicine

## 2016-08-15 VITALS — BP 124/78 | HR 84 | Temp 99.3°F | Ht 66.5 in | Wt 248.6 lb

## 2016-08-15 VITALS — BP 118/80 | HR 80 | Ht 66.5 in | Wt 248.0 lb

## 2016-08-15 DIAGNOSIS — R0609 Other forms of dyspnea: Secondary | ICD-10-CM | POA: Diagnosis not present

## 2016-08-15 DIAGNOSIS — R0600 Dyspnea, unspecified: Secondary | ICD-10-CM | POA: Insufficient documentation

## 2016-08-15 DIAGNOSIS — M545 Low back pain, unspecified: Secondary | ICD-10-CM

## 2016-08-15 DIAGNOSIS — J45991 Cough variant asthma: Secondary | ICD-10-CM | POA: Diagnosis not present

## 2016-08-15 LAB — TSH: TSH: 3.75 u[IU]/mL (ref 0.35–4.50)

## 2016-08-15 LAB — BRAIN NATRIURETIC PEPTIDE: PRO B NATRI PEPTIDE: 19 pg/mL (ref 0.0–100.0)

## 2016-08-15 LAB — SEDIMENTATION RATE: SED RATE: 65 mm/h — AB (ref 0–20)

## 2016-08-15 LAB — CBC WITH DIFFERENTIAL/PLATELET
BASOS ABS: 0.1 10*3/uL (ref 0.0–0.1)
Basophils Relative: 0.5 % (ref 0.0–3.0)
EOS PCT: 1.8 % (ref 0.0–5.0)
Eosinophils Absolute: 0.2 10*3/uL (ref 0.0–0.7)
HCT: 38.5 % (ref 36.0–46.0)
HEMOGLOBIN: 12.7 g/dL (ref 12.0–15.0)
Lymphocytes Relative: 21.8 % (ref 12.0–46.0)
Lymphs Abs: 2.8 10*3/uL (ref 0.7–4.0)
MCHC: 33 g/dL (ref 30.0–36.0)
MCV: 84.2 fl (ref 78.0–100.0)
Monocytes Absolute: 0.8 10*3/uL (ref 0.1–1.0)
Monocytes Relative: 5.8 % (ref 3.0–12.0)
Neutro Abs: 9.1 10*3/uL — ABNORMAL HIGH (ref 1.4–7.7)
Neutrophils Relative %: 70.1 % (ref 43.0–77.0)
Platelets: 415 10*3/uL — ABNORMAL HIGH (ref 150.0–400.0)
RBC: 4.57 Mil/uL (ref 3.87–5.11)
RDW: 14.6 % (ref 11.5–15.5)
WBC: 13 10*3/uL — AB (ref 4.0–10.5)

## 2016-08-15 LAB — BASIC METABOLIC PANEL
BUN: 6 mg/dL (ref 6–23)
CHLORIDE: 101 meq/L (ref 96–112)
CO2: 24 mEq/L (ref 19–32)
Calcium: 9.2 mg/dL (ref 8.4–10.5)
Creatinine, Ser: 0.57 mg/dL (ref 0.40–1.20)
GFR: 123.64 mL/min (ref >60.00)
Glucose, Bld: 138 mg/dL — ABNORMAL HIGH (ref 70–99)
POTASSIUM: 3.5 meq/L (ref 3.5–5.1)
SODIUM: 136 meq/L (ref 135–145)

## 2016-08-15 LAB — NITRIC OXIDE: NITRIC OXIDE: 12

## 2016-08-15 MED ORDER — NORETHINDRONE 0.35 MG PO TABS: 1.0000 | ORAL_TABLET | Freq: Every day | ORAL | 11 refills | Status: DC

## 2016-08-15 MED ORDER — DICLOFENAC SODIUM 2 % TD SOLN: 1.0000 "application " | Freq: Two times a day (BID) | TRANSDERMAL | 0 refills | Status: DC | PRN

## 2016-08-15 MED ORDER — CYCLOBENZAPRINE HCL 5 MG PO TABS: 5.0000 mg | ORAL_TABLET | Freq: Two times a day (BID) | ORAL | 0 refills | Status: DC | PRN

## 2016-08-15 MED ORDER — GABAPENTIN 100 MG PO CAPS: 100.0000 mg | ORAL_CAPSULE | Freq: Three times a day (TID) | ORAL | 2 refills | Status: DC

## 2016-08-15 MED FILL — NORETHINDRONE 0.35 MG TAB: 0.35 | 28 days supply | Qty: 28 | Fill #0

## 2016-08-15 NOTE — Assessment & Plan Note (Signed)
Body mass index is 39.43 kg/m.  -  trending up  Lab Results  Component Value Date   TSH 2.77 03/24/2016     Contributing to gerd risk/ doe/reviewed the need and the process to achieve and maintain neg calorie balance > defer f/u primary care including intermittently monitoring thyroid status

## 2016-08-15 NOTE — Progress Notes (Signed)
Subjective:    Patient ID: Debra West, female   DOB: 11-22-1975,    MRN: [id number]    Brief patient profile:  [age] yowf never smoker life long stuffy or runny nose but esp bad in spring /summer eyes itching/sneezing with occ sob but no need for inhalers able to play volley ball / tennis then while in college in [place] [employer] [facility] [place] allergy eval neg/ take saba prn then 2000 p exp to tarred roof had first "asthma attack"  [place] Viral uri flared up/refractory over 2 days whereas was not using albuterol at all prior, then it stopped working /win 48 h and ended up hospitalized x one week >  100% better with short term singuliar only and  no other maint rx then exact same scenario 2016 in [place]  > UC then to allergist and again neg w/u  > increased alb and no maint rx then [place] 11/2016 and did ok on no alb then again  same   scenario cold > chest > 3 x er/ 1 overnight > d/c 05/14/16 then back to ER so referred to pulmonary clinic 06/06/2016 by Debra RimaErica West    History of Present Illness  06/06/2016 1st [facility] office visit/ Debra West  Atypical asthma   - not back to baseline Jan 2018 on occ zantac/ very sensitive to smells   Chief Complaint  Patient presents with  . Pulmonary Consult    Referred by Dr. Helane RimaErica West. Pt c/o SOB and cough since Feb 2018. She also has some chest tightness. Her cough is non prod at this time.   cough to vomit with last flare  Onset of asthma flares may have been with initiation  of bcps Says 90% better now finishing pred/ no longer needing saba on symb 160 2bid but still sob with more than slow adls and sense of throat and chest "congestion" and tightness 24/7 but does not wake her up / worse on exp to cold air  rec Plan A = Automatic = Symbicort 80 Take 2 puffs first thing in am and then another 2 puffs about 12 hours later.  Work on inhaler technique:  relax and gently blow all the way out then take a nice smooth deep breath back in,  triggering the inhaler at same time you start breathing in.  Hold for up to 5 seconds if you can. Blow out thru nose. Rinse and gargle with water when done Plan B = Backup Only use your combivent as a rescue medication to be used if you can't catch your breath  Pantoprazole (protonix) 40 mg   Take  30-60 min before first meal of the day and Pepcid (famotidine)  20 mg one @  bedtime until return to office - this is the best way to tell whether stomach acid is contributing to your problem.   GERD diet    07/14/2016  f/u ov/Debra West re:  Cough variant asthma vs UACS on symb 80 2 bid  Chief Complaint  Patient presents with  . Follow-up    Pt developed the flu end of March 2018 and her SOB, cough and chest tightness has been worse since then.  Her cough is occ prod with white sputum.     [place] but when returned then next day which was March 24th 2018 aching all over, dry cough, fever > 102 seen by PCP 07/01/16 rx tamiflu/ prednisone for trouble breathing  >  Aching gone, fever gone, sore throat gone, cough is 50% improved but still bad esp around supper / mucus is white now min volume - using avg one combivent daily, makes her shaky if uses more  Prednisone completed one day prior to OV    Doe = MMRC2 = can't walk a nl pace on a flat grade s sob but does fine slow and flat   rec Any time you have a flare of respiratory symptoms go ahead and take the protonix Take 30- 60 min before your first and last meals of the day and drop back to once daily before  Plan A = Automatic = Symbicort 80 Take 2 puffs first thing in am and then another 2 puffs about 12 hours later.  Work on inhaler technique:    Plan B = Backup Only use your combivent as a rescue medication Pantoprazole (protonix) 40 mg   Take  30-60 min before first meal of the day and Pepcid (famotidine)  20 mg one @  bedtime until return to office - this is the best way to tell whether stomach acid is contributing to your problem.    GERD diet         08/15/2016  f/u ov/Debra West re:  uacs vs asthma  On [employer] disability since May 8th  Chief Complaint  Patient presents with  . Follow-up    Breathing and cough are unchanged. She states her chest feels tight. Her cough is occ prod with white sputum.    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 No unusual exp hx or h/o childhood pna/ asthma or knowledge of premature birth.  Sleeping ok without nocturnal  or early am exacerbation  of respiratory  c/o's or need for noct saba. Also denies any obvious fluctuation of symptoms with weather or environmental changes or other aggravating or alleviating factors except as outlined above   Current Medications, Allergies, Complete Past Medical History, Past Surgical History, Family History, and Social History were reviewed in [employer] record.  ROS  The following are not active complaints unless bolded sore throat, dysphagia, dental problems, itching, sneezing,  nasal congestion or excess/ purulent secretions, ear ache,   fever, chills, sweats, unintended wt loss, classically pleuritic or exertional cp,  orthopnea pnd or leg swelling, presyncope, palpitations, abdominal pain, anorexia, nausea, vomiting, diarrhea  or change in bowel or bladder habits, change in stools or urine, dysuria,hematuria,  rash, arthralgias, visual complaints, headache, numbness, weakness or ataxia or problems with walking or coordination,  change in  mood/affect or memory.                Objective:   Physical Exam    obese amb wf nad   Quite hoarse very somber    08/15/2016       248  07/14/2016         245   06/06/16  242 lb (109.8 kg)  05/19/16 236 lb 14.4 oz (107.5 kg)  05/15/16 246 lb 3.2 oz (111.7 kg)    Vital signs reviewed - Note on arrival 02 sats  96% on RA     HEENT: nl dentition,  and oropharynx which is pristine. Nl external ear canals without cough reflex - moderate bilateral non-specific turbinate edema     NECK :  without JVD/Nodes/TM/ nl carotid upstrokes bilaterally   LUNGS: no acc muscle use,  Nl contour chest clear to A and P with no cough on insp or exp    CV:  RRR  no s3 or murmur or increase in P2, and no edema   ABD:  soft and nontender with nl inspiratory excursion in the supine position. No bruits or organomegaly appreciated, bowel sounds nl  MS:  Nl gait/ ext warm without deformities, calf tenderness, cyanosis or clubbing No obvious joint restrictions   SKIN: warm and dry without lesions    NEURO:  alert, approp, nl sensorium with  no motor or cerebellar deficits apparent.      CXR PA and Lateral:   08/15/2016 :    I personally reviewed images and agree with radiology impression as follows:   No active cardiopulmonary disease     Labs ordered/ reviewed:      Chemistry      Component Value Date/Time   NA 136 08/15/2016 1316   K 3.5 08/15/2016 1316   CL 101 08/15/2016 1316   CO2 24 08/15/2016 1316   BUN 6 08/15/2016 1316   CREATININE 0.57 08/15/2016 1316      Component Value Date/Time   CALCIUM 9.2 08/15/2016 1316   ALKPHOS 38 05/19/2016 1439   AST 60 (H) 05/19/2016 1439   ALT 60 (H) 05/19/2016 1439   BILITOT 0.7 05/19/2016 1439        Lab Results  Component Value Date   WBC 13.0 (H) 08/15/2016   HGB 12.7 08/15/2016   HCT 38.5 08/15/2016   MCV 84.2 08/15/2016   PLT 415.0 (H) 08/15/2016     Lab Results  Component Value Date   DDIMER 0.36 08/15/2016      Lab  Results  Component Value Date   TSH 3.75 08/15/2016     Lab Results  Component Value Date   PROBNP 19.0 08/15/2016       Lab Results  Component Value Date   ESRSEDRATE 65 (H) 08/15/2016        Assessment:

## 2016-08-15 NOTE — Patient Instructions (Signed)
Gabapentin 100 mg three times daily   Please remember to go to the lab and x-ray department downstairs in the basement  for your tests - we will call you with the results when they are available.  Please schedule a follow up office visit in 4 weeks, sooner if needed  with all medications /inhalers/ solutions in hand so we can verify exactly what you are taking. This includes all medications from all doctors and over the counters

## 2016-08-15 NOTE — Assessment & Plan Note (Addendum)
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

## 2016-08-15 NOTE — Addendum Note (Signed)
Addended by: Helane RimaWALLACE, Adalbert Alberto R on: 08/15/2016 07:10 PM   Modules accepted: Orders

## 2016-08-15 NOTE — Progress Notes (Signed)
Debra West is a 41 y.o. female is here for follow up.  History of Present Illness:   Insurance claims handlerAmber West, CMA, acting as scribe for Dr. Earlene PlaterWallace.  Asthma  There is no cough or shortness of breath. This is a chronic problem. The current episode started more than 1 year ago. The problem occurs intermittently. The problem has been waxing and waning. Associated symptoms include headaches. Pertinent negatives include no chest pain, ear pain, fever or sore throat. Her symptoms are alleviated by steroid inhaler. Her past medical history is significant for asthma.  Back Pain  This is a recurrent problem. The current episode started in the past 7 days. The problem occurs constantly. The problem is unchanged. The pain is present in the lumbar spine. The quality of the pain is described as shooting. The pain radiates to the right thigh, right knee and right foot. The pain is moderate. The pain is the same all the time. Associated symptoms include headaches. Pertinent negatives include no abdominal pain, chest pain or fever. She has tried chiropractic manipulation for the symptoms. The treatment provided no relief (Patient states she had more pain after chiropractic adjustment.).  Neck Pain   This is a recurrent problem. The current episode started in the past 7 days. The problem occurs constantly. The problem has been unchanged. The pain is associated with nothing. The pain is present in the right side. The quality of the pain is described as aching. The pain is moderate. Nothing aggravates the symptoms. The pain is same all the time. Associated symptoms include headaches. Pertinent negatives include no chest pain or fever. She has tried chiropractic manipulation for the symptoms. The treatment provided no relief.   There are no preventive care reminders to display for this patient.  PMHx, SurgHx, SocialHx, FamHx, Medications, and Allergies were reviewed in the Visit Navigator and updated as appropriate.    Patient Active Problem List   Diagnosis Date Noted  . Dyspnea on exertion 08/15/2016  . 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 Pernicious anemia 05/16/2016  . Morbid obesity due to excess calories (HCC) 05/16/2016  . Migraine without aura and without status migrainosus, not intractable   . Gastroesophageal reflux disease   . Reactive airway disease with acute exacerbation 05/13/2016  . Hypothyroidism 01/17/2016  . Left ovarian cyst 03/16/2014  . Abnormal weight gain 03/11/2014  . Sciatica 05/22/2011  . Diaphragmatic hernia 06/14/2010  . Adjustment disorder with depressed mood 07/12/2008  . 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   Social History  Substance Use Topics  . Smoking status: Never Smoker  . Smokeless tobacco: Never Used  . Alcohol use Yes     Comment: Occasionally   Current Medications and Allergies:   Current Outpatient Prescriptions:  .  budesonide-formoterol (SYMBICORT) 80-4.5 MCG/ACT inhaler, Inhale 2 puffs into the lungs 2 (two) times daily., Disp: , Rfl:  .  FLUoxetine (PROZAC) 20 MG capsule, TAKE 1 CAP BY MOUTH DAILY EVERY MORNING., Disp: 90 capsule, Rfl: 1 .  guaiFENesin-codeine (CHERATUSSIN AC) 100-10 MG/5ML syrup, Take 10 mLs by mouth at bedtime as needed for cough or congestion., Disp: 120 mL, Rfl: 0 .  ibuprofen (ADVIL,MOTRIN) 200 MG tablet, Take 2-3 tablets (400-600 mg total) by mouth every 8 (eight) hours as needed for fever, headache, mild pain, moderate pain or cramping., Disp: , Rfl:  .  Ipratropium-Albuterol (COMBIVENT RESPIMAT) 20-100 MCG/ACT AERS respimat, Inhale 1 puff into the lungs every 6 (six) hours as needed for wheezing or shortness of  breath., Disp: 1 Inhaler, Rfl: 2 .  levothyroxine (SYNTHROID, LEVOTHROID) 50 MCG tablet, Take 1 tablet (50 mcg total) by mouth daily., Disp: 90 tablet, Rfl: 0 .  midodrine (PROAMATINE) 5 MG tablet, Take 5 mg by mouth 3 (three) times daily as needed (for low blood pressure (less than  105)). , Disp: , Rfl:  .  norethindrone-ethinyl estradiol-iron (LOESTRIN FE 1.5/30) 1.5-30 MG-MCG tablet, Take 1 tablet by mouth daily., Disp: , Rfl:  .  pantoprazole (PROTONIX) 40 MG tablet, Take 1 tablet (40 mg total) by mouth daily. Take 30-60 min before first meal of the day, Disp: 30 tablet, Rfl: 2 .  ranitidine (ZANTAC) 150 MG capsule, Take 1 capsule (150 mg total) by mouth at bedtime., Disp: 30 capsule, Rfl: 11 .  SUMAtriptan (IMITREX) 100 MG tablet, Take 1 tablet (100 mg total) by mouth every 12 (twelve) hours as needed (intractable migraine). May repeat in 2 hours if headache persists or recurs., Disp: 12 tablet, Rfl: 0  Allergies  Allergen Reactions  . Depo-Medrol [Methylprednisolone Acetate] Shortness Of Breath  . Aspirin Nausea And Vomiting   Review of Systems   Review of Systems  Constitutional: Negative for chills and fever.  HENT: Negative for ear pain and sore throat.   Eyes: Negative for blurred vision.  Respiratory: Negative for cough and shortness of breath.   Cardiovascular: Negative for chest pain and palpitations.  Gastrointestinal: Negative for abdominal pain, nausea and vomiting.  Genitourinary: Negative for frequency.  Musculoskeletal: Positive for back pain and neck pain.  Skin: Negative for rash.  Neurological: Positive for headaches. Negative for dizziness and loss of consciousness.  Psychiatric/Behavioral: Negative for depression. The patient is not nervous/anxious.     Vitals:   Vitals:   08/15/16 1101  BP: 124/78  Pulse: 84  Temp: 99.3 F (37.4 C)  TempSrc: Oral  SpO2: 96%  Weight: 248 lb 9.6 oz (112.8 kg)  Height: 5' 6.5" (1.689 m)     Body mass index is 39.52 kg/m.   Physical Exam:   Physical Exam  Constitutional: She appears well-nourished.  HENT:  Head: Normocephalic and atraumatic.  Eyes: EOM are normal. Pupils are equal, round, and reactive to light.  Neck: Normal range of motion. Neck supple.  Cardiovascular: Normal rate,  regular rhythm, normal heart sounds and intact distal pulses.   Pulmonary/Chest: Effort normal.  Abdominal: Soft.  Musculoskeletal:       Back:  Skin: Skin is warm.  Psychiatric: She has a normal mood and affect. Her behavior is normal.  Nursing note and vitals reviewed.    Assessment and Plan:    Debra West was seen today for follow-up, asthma, back pain and neck pain.  Diagnoses and all orders for this visit:  Acute left-sided low back pain without sciatica -     Diclofenac Sodium (PENNSAID) 2 % SOLN; Place 1 application onto the skin 2 (two) times daily as needed. -     cyclobenzaprine (FLEXERIL) 5 MG tablet; Take 1 tablet (5 mg total) by mouth 2 (two) times daily as needed for muscle spasms.  Cough variant asthma with atypical features ? VCD/UACS Comments: Will decrease estrogen in OCP per Pulmonology recommendations.  Orders: -     norethindrone (MICRONOR,CAMILA,ERRIN) 0.35 MG tablet; Take 1 tablet (0.35 mg total) by mouth daily. (Patient not taking: Reported on 08/15/2016)    . Reviewed expectations re: course of current medical issues. . Discussed self-management of symptoms. . Outlined signs and symptoms indicating need for more acute intervention. Marland Kitchen  Patient verbalized understanding and all questions were answered. . See orders for this visit as documented in the electronic medical record. . Patient received an After Visit Summary.   CMA served as Neurosurgeon during this visit. History, Physical, and Plan performed by medical provider. Documentation and orders reviewed and attested to. Helane Rima, D.O.  Helane Rima, DO [place] [place] 08/15/2016  Future Appointments Date Time Provider Department Center  09/12/2016 12:00 PM Nyoka Cowden, MD [facility] None

## 2016-08-15 NOTE — Assessment & Plan Note (Addendum)
08/15/2016  Walked RA x 3 laps @ 185 ft each stopped due to sob/ chest tight "like it does when need combivent" but no desats   Symptoms are markedly disproportionate to objective findings and not clear this is a lung problem but pt does appear to have difficult airway management issues. DDX of  difficult airways management almost all start with A and  include Adherence, Ace Inhibitors, Acid Reflux, Active Sinus Disease, Alpha 1 Antitripsin deficiency, Anxiety masquerading as Airways dz,  ABPA,  Allergy(esp in young), Aspiration (esp in elderly), Adverse effects of meds,  Active smokers, A bunch of PE's (a small clot burden can't cause this syndrome unless there is already severe underlying pulm or vascular dz with poor reserve) plus two Bs  = Bronchiectasis and Beta blocker use..and one C= CHF   Adherence is always the initial "prime suspect" and is a multilayered concern that requires a "trust but verify" approach in every patient - starting with knowing how to use medications, especially inhalers, correctly, keeping up with refills and understanding the fundamental difference between maintenance and prns vs those medications only taken for a very short course and then stopped and not refilled.  - needs to return with all meds in hand using a trust but verify approach to confirm accurate Medication  Reconciliation The principal here is that until we are certain that the  patients are doing what we've asked, it makes no sense to ask them to do more.   ? Acid (or non-acid) GERD > always difficult to exclude as up to 75% of pts in some series report no assoc GI/ Heartburn symptoms and note still on bcps> rec continue max (24h)  acid suppression and diet restrictions/ reviewed     ? Anxiety /depression > usually at the bottom of this list of usual suspects but should be much higher on this pt's based on H and P and note already on psychotropics .   ? Allergy/asthma > very little to support (see separate  a/p)   ? A bunch of PE's > D dimer nl - while  A nl valute  may miss small peripheral pe, the clot burden with sob is moderately high and the d dimer has a very high neg pred value in this setting    ? CHF > excluded with bnp so low   Suspect this is just obesity / deconditioning now.

## 2016-08-15 NOTE — Progress Notes (Signed)
lmtcb

## 2016-08-16 LAB — D-DIMER, QUANTITATIVE: D-Dimer, Quant: 0.36 mcg/mL FEU (ref <0.50)

## 2016-08-18 LAB — RESPIRATORY ALLERGY PROFILE REGION II ~~LOC~~
Allergen, C. Herbarum, M2: <0.1 kU/L
Allergen, Cottonwood, t14: <0.1 kU/L
Allergen, D pternoyssinus,d7: <0.1 kU/L
Allergen, Mulberry, t76: <0.1 kU/L
Allergen, Oak,t7: <0.1 kU/L
Bermuda Grass: <0.1 kU/L
Box Elder IgE: <0.1 kU/L
Cat Dander: <0.1 kU/L
Common Ragweed: <0.1 kU/L
D. farinae: <0.1 kU/L
Dog Dander: <0.1 kU/L
Elm IgE: <0.1 kU/L
Johnson Grass: <0.1 kU/L
Pecan/Hickory Tree IgE: <0.1 kU/L
Sheep Sorrel IgE: <0.1 kU/L

## 2016-08-18 NOTE — Progress Notes (Signed)
Left her a detailed msg

## 2016-08-18 NOTE — Progress Notes (Signed)
Left detailed msg with results

## 2016-08-26 ENCOUNTER — Telehealth: Payer: Self-pay | Admitting: Internal Medicine

## 2016-08-26 NOTE — Telephone Encounter (Signed)
Called and spoke with pt and she stated that the forms for the medical disability were sent over last week and she wanted to see what the update on these forms are.  Verlon AuLeslie have you happen to see any forms come for MW to fill out?  thanks

## 2016-08-26 NOTE — Telephone Encounter (Signed)
I do not recall seeing any forms on her  She should check with [employer], thanks

## 2016-08-26 NOTE — Telephone Encounter (Signed)
Called and spoke with pt and she is aware that MW has not received any paper work on her.  Number given to her for [employer] to call there and speak with someone about these forms.  Pt will call and call back here for anything further .

## 2016-08-27 ENCOUNTER — Encounter: Payer: Self-pay | Admitting: Family Medicine

## 2016-08-28 ENCOUNTER — Other Ambulatory Visit: Payer: Self-pay

## 2016-08-28 DIAGNOSIS — M545 Low back pain, unspecified: Secondary | ICD-10-CM

## 2016-08-28 MED ORDER — DICLOFENAC SODIUM 2 % TD SOLN: 1.0000 "application " | Freq: Two times a day (BID) | TRANSDERMAL | 1 refills | Status: DC | PRN

## 2016-09-02 ENCOUNTER — Telehealth: Payer: Self-pay | Admitting: Internal Medicine

## 2016-09-02 DIAGNOSIS — J45991 Cough variant asthma: Secondary | ICD-10-CM

## 2016-09-02 NOTE — Telephone Encounter (Signed)
Rec'd FMLA forms from [employer] via interoffice mail - fwd to [place] to have MW complete -pr

## 2016-09-02 NOTE — Telephone Encounter (Signed)
Unfortunately none is available so needs referral to [facility]/ Dr Delford FieldWright voice center

## 2016-09-02 NOTE — Telephone Encounter (Signed)
Pt states during her OV on 08/15/16, she was prescribed gabapentin 100mg . Pt states since starting gabapentin, her acid reflux has worsen. Pt states she is having an acid reflux flare after every meal and every trouble sleeping. Pt states she is currently taken protonix 40mg  and zantac 150mg  with no improvement. Pt is requesting an alternative to gabapentin.  MW please advise. Thanks.   08/15/16  Gabapentin 100 mg three times daily   Please remember to go to the lab and x-ray department downstairs in the basement  for your tests - we will call you with the results when they are available.  Please schedule a follow up office visit in 4 weeks, sooner if needed  with all medications /inhalers/ solutions in hand so we can verify exactly what you are taking. This includes all medications from all doctors and over the counters

## 2016-09-02 NOTE — Telephone Encounter (Signed)
Forms given to MW to complete 

## 2016-09-02 NOTE — Telephone Encounter (Signed)
Spoke with pt, aware of recs.  rx sent to preferred pharmacy.  Nothing further needed.  

## 2016-09-04 NOTE — Telephone Encounter (Signed)
Verlon AuLeslie has MW given these forms back to you yet?

## 2016-09-04 NOTE — Telephone Encounter (Signed)
No, they are still in his lookat

## 2016-09-05 NOTE — Telephone Encounter (Signed)
Forms completed  Copies made and placed up front for pick up  Patrice sent the originals to [employer]  Pt aware

## 2016-09-05 NOTE — Telephone Encounter (Signed)
Pt calling again to check on status.  Pt states that she wants to pick these up and take them herself to Medical Records so that this speeds up the process. Pt states that she is not getting paid until these forms are completed.  Please advise thanks.

## 2016-09-05 NOTE — Telephone Encounter (Signed)
Rec'd disability forms from [place] - fwd to [employer] via [employer] -pr

## 2016-09-05 NOTE — Telephone Encounter (Signed)
MW- please let me know once you have completed the forms so she can pick up, thanks

## 2016-09-08 ENCOUNTER — Ambulatory Visit (INDEPENDENT_AMBULATORY_CARE_PROVIDER_SITE_OTHER): Payer: 59 | Admitting: Internal Medicine

## 2016-09-08 ENCOUNTER — Encounter: Payer: Self-pay | Admitting: Internal Medicine

## 2016-09-08 ENCOUNTER — Encounter: Payer: Self-pay | Admitting: *Deleted

## 2016-09-08 VITALS — BP 122/84 | HR 102 | Ht 66.5 in | Wt 253.0 lb

## 2016-09-08 DIAGNOSIS — J45991 Cough variant asthma: Secondary | ICD-10-CM

## 2016-09-08 MED ORDER — RANITIDINE HCL 300 MG PO CAPS: 300.0000 mg | ORAL_CAPSULE | Freq: Every evening | ORAL | 11 refills | Status: DC

## 2016-09-08 MED ORDER — ESOMEPRAZOLE MAGNESIUM 40 MG PO CPDR: DELAYED_RELEASE_CAPSULE | ORAL | 2 refills | Status: DC

## 2016-09-08 NOTE — Patient Instructions (Addendum)
Nexium 40 mg  take  30-60 min before first meal of the day and ranitidine 300 mg  After supper  until return to office - this is the best way to tell whether stomach acid is contributing to your problem.    No other change in medications.   GERD (REFLUX)  is an extremely common cause of respiratory symptoms just like yours , many times with no obvious heartburn at all.    It can be treated with medication, but also with lifestyle changes including elevation of the head of your bed (ideally with 6 inch  bed blocks),  Smoking cessation, avoidance of late meals, excessive alcohol, and avoid fatty foods, chocolate, peppermint, colas, red wine, and acidic juices such as orange juice.  NO MINT OR MENTHOL PRODUCTS SO NO COUGH DROPS   USE SUGARLESS CANDY INSTEAD (Jolley ranchers or Stover's or Life Savers) or even ice chips will also do - the key is to swallow to prevent all throat clearing. NO OIL BASED VITAMINS - use powdered substitutes.    I would try to return to work but avoid as much as possible exposure to fumes and vapors that may irritate your airway. If you  still can't tolerate it will need to avoid the lab (where the source of fumes is) as much as possible.    If you cannot return to work without flare of symptoms, please call.    Please see patient coordinator before you leave today  to schedule methacholine Challenge   Keep aptt to see Dr Delford FieldWright

## 2016-09-08 NOTE — Assessment & Plan Note (Signed)
Body mass index is 40.22 kg/m.  -  Trending up to severe levels  Lab Results  Component Value Date   TSH 3.75 08/15/2016     Contributing to gerd risk/ doe/reviewed the need and the process to achieve and maintain neg calorie balance > defer f/u primary care including intermittently monitoring thyroid status

## 2016-09-08 NOTE — Assessment & Plan Note (Addendum)
Spirometry 06/06/2016   wnl after symbicort 160 x 2 with FENO  = 6 p pred rx complete - 06/06/2016   try reduce symbicort 80 2 bid  - 07/14/2016  After extensive coaching HFA effectiveness =    90% with SMI (combivent)  - FENO 08/15/2016  =   12 - Spirometry 08/15/2016  Nl p spirometry though f/v contour not physiologic in effort dep portion  - Allergy profile 08/15/2016 >  Eos 0.2/  IgE  < 2 with neg RAST - 08/15/2016 added gabapentin 100 mg tid > caused HB but eliminated cough   - Referred to Dr Delford FieldWright for October 06 2016  - MCT 09/08/2016 >>>   Not certain re dx asthma vs uacs but best way to sort out is mct while on max rx for gerd  I had an extended discussion with the patient reviewing all relevant studies completed to date and  lasting 15 to 20 minutes of a 25 minute visit on the following ongoing concerns:   Formulary restrictions will be an ongoing challenge for the forseable future and I would be happy to pick an alternative if the pt will first  provide me a list of them but pt  will need to return here for training for any new device that is required eg dpi vs hfa vs respimat.    In meantime we can always provide samples so the patient never runs out of any needed respiratory medications.   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  Please see AVS for specific  Instructions which are unique to this visit and I personally typed out  which were reviewed in detail in writing with the patient and a copy provided.

## 2016-09-08 NOTE — Progress Notes (Signed)
Subjective:    Patient ID: Debra West, female   DOB: 11-22-1975,    MRN: [id number]    Brief patient profile:  [age] yowf never smoker life long stuffy or runny nose but esp bad in spring /summer eyes itching/sneezing with occ sob but no need for inhalers able to play volley ball / tennis then [place] in [place] in [place] [facility] allergy eval neg/ take saba prn then 2000 p exp to tarred roof had first "asthma attack"  [place] Viral uri flared up/refractory over 2 days whereas was not using albuterol at all prior, then it stopped working /win 48 h and ended up hospitalized x one week >  100% better with short term singuliar only and  no other maint rx then exact same scenario 2016 in [place]  > UC then to allergist and again neg w/u  > increased alb and no maint rx then [place] 11/2016 and did ok on no alb then again  same   scenario cold > chest > 3 x er/ 1 overnight > d/c 05/14/16 then back to ER so referred to pulmonary clinic 06/06/2016 by Helane RimaErica Wallace    History of Present Illness  06/06/2016 1st [facility] office visit/ Debra West  Atypical asthma   - not back to baseline Jan 2018 on occ zantac/ very sensitive to smells   Chief Complaint  Patient presents with  . Pulmonary Consult    Referred by Dr. Helane RimaErica Wallace. Pt c/o SOB and cough since Feb 2018. She also has some chest tightness. Her cough is non prod at this time.   cough to vomit with last flare  Onset of asthma flares may have been with initiation  of bcps Says 90% better now finishing pred/ no longer needing saba on symb 160 2bid but still sob with more than slow adls and sense of throat and chest "congestion" and tightness 24/7 but does not wake her up / worse on exp to cold air  rec Plan A = Automatic = Symbicort 80 Take 2 puffs first thing in am and then another 2 puffs about 12 hours later.  Work on inhaler technique:  relax and gently blow all the way out then take a nice smooth deep breath back in,  triggering the inhaler at same time you start breathing in.  Hold for up to 5 seconds if you can. Blow out thru nose. Rinse and gargle with water when done Plan B = Backup Only use your combivent as a rescue medication to be used if you can't catch your breath  Pantoprazole (protonix) 40 mg   Take  30-60 min before first meal of the day and Pepcid (famotidine)  20 mg one @  bedtime until return to office - this is the best way to tell whether stomach acid is contributing to your problem.   GERD diet    07/14/2016  f/u ov/Debra West re:  Cough variant asthma vs UACS on symb 80 2 bid  Chief Complaint  Patient presents with  . Follow-up    Pt developed the flu end of March 2018 and her SOB, cough and chest tightness has been worse since then.  Her cough is occ prod with white sputum.     felt [place] on [place] but when returned then next day which was March 24th 2018 aching all over, dry cough, fever > 102 seen by PCP 07/01/16 rx tamiflu/ prednisone for trouble breathing  >  Aching gone, fever gone, sore throat gone, cough is 50% improved but still bad esp around supper / mucus is white now min volume - using avg one combivent daily, makes her shaky if uses more  Prednisone completed one day prior to OV    Doe = MMRC2 = can't walk a nl pace on a flat grade s sob but does fine slow and flat   rec Any time you have a flare of respiratory symptoms go ahead and take the protonix Take 30- 60 min before your first and last meals of the day and drop back to once daily before  Plan A = Automatic = Symbicort 80 Take 2 puffs first thing in am and then another 2 puffs about 12 hours later.  Work on inhaler technique:    Plan B = Backup Only use your combivent as a rescue medication Pantoprazole (protonix) 40 mg   Take  30-60 min before first meal of the day and Pepcid (famotidine)  20 mg one @  bedtime until return to office - this is the best way to tell whether stomach acid is contributing to your problem.    GERD diet         08/15/2016  f/u ov/Debra West re:  uacs vs asthma  On ST disability since May 8th  Chief Complaint  Patient presents with  . Follow-up    Breathing and cough are unchanged. She states her chest feels tight. Her cough is occ prod with white sputum.    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    09/08/2016  f/u ov/Debra West re:  UACS   Chief Complaint  Patient presents with  . Follow-up    Cough and SOB have improved some on gabapentin, but she has noticed increased acid reflux symptoms.   no cough at all but not back in lab yet - due back June 11 and not sure she'll tolerate fumes but no exp yet to know one way  Or another.  Not limited by breathing from desired activities    No obvious day to day or daytime variability or assoc excess/ purulent sputum or mucus plugs or hemoptysis or cp or chest tightness, subjective wheeze or overt sinus or hb symptoms. No unusual exp hx or h/o childhood pna/ asthma or knowledge of premature birth.  Sleeping ok without nocturnal  or early am exacerbation  of respiratory  c/o's or need for noct saba. Also denies any obvious fluctuation of symptoms with weather or environmental changes or other aggravating or alleviating factors except as outlined above   Current Medications, Allergies, Complete Past Medical History, Past Surgical History, Family History, and Social History were reviewed in [employer]  record.  ROS  The following are not active complaints unless bolded sore throat, dysphagia, dental problems, itching, sneezing,  nasal congestion or excess/ purulent secretions, ear ache,  fever, chills, sweats, unintended wt loss, classically pleuritic or exertional cp,  orthopnea pnd or leg swelling, presyncope, palpitations, abdominal pain, anorexia, nausea, vomiting, diarrhea  or change in bowel or bladder habits, change in stools or urine, dysuria,hematuria,  rash, arthralgias, visual complaints, headache, numbness, weakness or ataxia or problems with walking or coordination,  change in mood/affect or memory.             Objective:   Physical Exam    Obese somber amb wf nad   - no cough at all   09/08/2016         253  08/15/2016       248  07/14/2016         245   06/06/16 242 lb (109.8 kg)  05/19/16 236 lb 14.4 oz (107.5 kg)  05/15/16 246 lb 3.2 oz (111.7 kg)    Vital signs reviewed - Note on arrival 02 sats  96% on RA     HEENT: nl dentition,  and oropharynx which is pristine. Nl external ear canals without cough reflex - moderate bilateral non-specific turbinate edema     NECK :  without JVD/Nodes/TM/ nl carotid upstrokes bilaterally   LUNGS: no acc muscle use,  Nl contour chest clear to A and P with no cough on insp or exp    CV:  RRR  no s3 or murmur or increase in P2, and no edema   ABD:  soft and nontender with nl inspiratory excursion in the supine position. No bruits or organomegaly appreciated, bowel sounds nl  MS:  Nl gait/ ext warm without deformities, calf tenderness, cyanosis or clubbing No obvious joint restrictions   SKIN: warm and dry without lesions    NEURO:  alert, approp, nl sensorium with  no motor or cerebellar deficits apparent.        I personally reviewed images and agree with radiology impression as follows:  CXR:   08/15/16  No active cardiopulmonary disease.         Assessment:

## 2016-09-12 ENCOUNTER — Ambulatory Visit (HOSPITAL_COMMUNITY)
Admission: RE | Admit: 2016-09-12 | Discharge: 2016-09-12 | Disposition: A | Discharge status: Home / Self Care | Payer: 59 | Source: Ambulatory Visit | Attending: Internal Medicine | Admitting: Internal Medicine

## 2016-09-12 ENCOUNTER — Ambulatory Visit: Payer: 59 | Admitting: Internal Medicine

## 2016-09-12 ENCOUNTER — Telehealth: Payer: Self-pay | Admitting: Internal Medicine

## 2016-09-12 DIAGNOSIS — J45991 Cough variant asthma: Secondary | ICD-10-CM | POA: Insufficient documentation

## 2016-09-12 LAB — PULMONARY FUNCTION TEST
FEF 25-75 Post: 3.7 L/sec
FEF 25-75 Pre: 3.75 L/sec
FEF2575-%Change-Post: -1 %
FEF2575-%PRED-POST: 115 %
FEF2575-%Pred-Pre: 117 %
FEV1-%CHANGE-POST: -2 %
FEV1-%PRED-PRE: 82 %
FEV1-%Pred-Post: 80 %
FEV1-POST: 2.59 L
FEV1-PRE: 2.66 L
FEV1FVC-%Change-Post: 0 %
FEV1FVC-%Pred-Pre: 108 %
FEV6-%CHANGE-POST: -2 %
FEV6-%PRED-POST: 74 %
FEV6-%PRED-PRE: 76 %
FEV6-POST: 2.89 L
FEV6-PRE: 2.98 L
FEV6FVC-%CHANGE-POST: 0 %
FEV6FVC-%PRED-POST: 102 %
FEV6FVC-%PRED-PRE: 101 %
FVC-%CHANGE-POST: -2 %
FVC-%PRED-POST: 72 %
FVC-%Pred-Pre: 74 %
FVC-Post: 2.89 L
FVC-Pre: 2.98 L
POST FEV6/FVC RATIO: 100 %
PRE FEV6/FVC RATIO: 100 %
Post FEV1/FVC ratio: 89 %
Pre FEV1/FVC ratio: 89 %

## 2016-09-12 MED ORDER — METHACHOLINE 0.25 MG/ML NEB SOLN
2.0000 mL | Freq: Once | RESPIRATORY_TRACT | Status: AC
  Administered 2016-09-12: 0.5 mg via RESPIRATORY_TRACT

## 2016-09-12 MED ORDER — METHACHOLINE 4 MG/ML NEB SOLN
2.0000 mL | Freq: Once | RESPIRATORY_TRACT | Status: AC
  Administered 2016-09-12: 8 mg via RESPIRATORY_TRACT

## 2016-09-12 MED ORDER — METHACHOLINE 1 MG/ML NEB SOLN
2.0000 mL | Freq: Once | RESPIRATORY_TRACT | Status: AC
  Administered 2016-09-12: 2 mg via RESPIRATORY_TRACT

## 2016-09-12 MED ORDER — SODIUM CHLORIDE 0.9 % IN NEBU
3.0000 mL | INHALATION_SOLUTION | Freq: Once | RESPIRATORY_TRACT | Status: AC
  Administered 2016-09-12: 3 mL via RESPIRATORY_TRACT

## 2016-09-12 MED ORDER — ALBUTEROL SULFATE (2.5 MG/3ML) 0.083% IN NEBU
2.5000 mg | INHALATION_SOLUTION | Freq: Once | RESPIRATORY_TRACT | Status: AC
  Administered 2016-09-12: 2.5 mg via RESPIRATORY_TRACT

## 2016-09-12 MED ORDER — METHACHOLINE 16 MG/ML NEB SOLN: 2.0000 mL | Freq: Once | RESPIRATORY_TRACT | Status: DC

## 2016-09-12 MED ORDER — METHACHOLINE 0.0625 MG/ML NEB SOLN
2.0000 mL | Freq: Once | RESPIRATORY_TRACT | Status: AC
  Administered 2016-09-12: 0.125 mg via RESPIRATORY_TRACT

## 2016-09-12 NOTE — Telephone Encounter (Signed)
Notes recorded by Nyoka CowdenWert, Michael B, MD on 09/12/2016 at 3:23 PM EDT Call patient : Study is unremarkable, no evidence at all of asthma, keep appt for Dr Delford FieldWright Next step  --------------------------------------- Spoke with pt. She is aware of results. 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 Pt needs a work note stating that she can return to work with no restrictions.  MW - please advise. Thanks.

## 2016-09-12 NOTE — Telephone Encounter (Signed)
Fine with me

## 2016-09-12 NOTE — Progress Notes (Signed)
LMTCB

## 2016-09-15 NOTE — Telephone Encounter (Signed)
Pt states the work note to state: If there are restrictions the note needs to be concise and clear, and if no restrictions it should state specifially no restrictions, the notes need state gradually back into the laboratory just in case the lab is the trigger for her condition...pt contact # [phone]...ert

## 2016-09-15 NOTE — Telephone Encounter (Signed)
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

## 2016-09-15 NOTE — Telephone Encounter (Signed)
lmtcb x1 for pt. 

## 2016-09-16 NOTE — Telephone Encounter (Signed)
Spoke with pt and verified that she would like to pick up letter. Letter was done and placed up front for her to pick up. She is aware to have her I.D. She will make sure before she leaves that the letter is ok. She had no further questions at this time. Nothing further is needed

## 2016-09-16 NOTE — Telephone Encounter (Signed)
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

## 2016-09-22 MED FILL — NORETHINDRONE 0.35 MG TAB: 0.35 | 28 days supply | Qty: 28 | Fill #1

## 2016-10-02 ENCOUNTER — Telehealth: Payer: Self-pay | Admitting: Internal Medicine

## 2016-10-02 NOTE — Telephone Encounter (Signed)
Spoke with [employer]. She wanted to know if Dr. Sherene SiresWert had any recommendations for once the patient returns back to work. States that the patient's 8 weeks of being out of work are not yet up, but they want her to ease back into work to make sure she can handle it before she starts working 40hrs a week again.   MW, please advise. Thanks.

## 2016-10-02 NOTE — Telephone Encounter (Signed)
LMOM TCB x1 for Whitney w/ [employer]

## 2016-10-02 NOTE — Telephone Encounter (Signed)
No restrictions on return to work other than avoid exp to heavy fumes/dust as much as possible

## 2016-10-03 ENCOUNTER — Encounter: Payer: Self-pay | Admitting: *Deleted

## 2016-10-03 NOTE — Telephone Encounter (Signed)
No restrictions on return to work other than avoid exp to heavy fumes/dust as much as possible  

## 2016-10-03 NOTE — Telephone Encounter (Signed)
Whitney returned phone call..ert ° °

## 2016-10-03 NOTE — Telephone Encounter (Signed)
Whitney returned phone call..ert

## 2016-10-03 NOTE — Telephone Encounter (Signed)
Called and spoke with Alphonzo Lemm[employer]Whitney and she is aware that we will get this letter done and fax it to her at the fax number given.

## 2016-10-03 NOTE — Telephone Encounter (Signed)
I have spoken with Debra West with [employer] and made her aware of MW's recommendations. Debra West ask that we fax written documentation with MW's recommendations.  Letter should be faxed to [phone]  MW please advise what you would like letter to state. Thanks.

## 2016-10-03 NOTE — Telephone Encounter (Signed)
lmtcb x1 for [employer] with [employer]

## 2016-10-03 NOTE — Telephone Encounter (Signed)
lmomtcb x 2 for Whitney with [employer]

## 2016-10-14 ENCOUNTER — Other Ambulatory Visit: Payer: Self-pay | Admitting: Internal Medicine

## 2016-10-21 DIAGNOSIS — J3801 Paralysis of vocal cords and larynx, unilateral: Secondary | ICD-10-CM | POA: Insufficient documentation

## 2016-10-21 HISTORY — DX: Paralysis of vocal cords and larynx, unilateral: J38.01

## 2016-11-12 ENCOUNTER — Other Ambulatory Visit: Payer: Self-pay | Admitting: Family Medicine

## 2016-11-27 ENCOUNTER — Ambulatory Visit (INDEPENDENT_AMBULATORY_CARE_PROVIDER_SITE_OTHER): Payer: 59 | Admitting: Family Medicine

## 2016-11-27 ENCOUNTER — Encounter: Payer: Self-pay | Admitting: Family Medicine

## 2016-11-27 VITALS — BP 124/86 | HR 85 | Temp 98.4°F | Ht 66.5 in | Wt 256.6 lb

## 2016-11-27 DIAGNOSIS — R5383 Other fatigue: Secondary | ICD-10-CM

## 2016-11-27 DIAGNOSIS — E039 Hypothyroidism, unspecified: Secondary | ICD-10-CM

## 2016-11-27 DIAGNOSIS — Z0000 Encounter for general adult medical examination without abnormal findings: Secondary | ICD-10-CM

## 2016-11-27 DIAGNOSIS — G4700 Insomnia, unspecified: Secondary | ICD-10-CM | POA: Diagnosis not present

## 2016-11-27 DIAGNOSIS — J3801 Paralysis of vocal cords and larynx, unilateral: Secondary | ICD-10-CM

## 2016-11-27 DIAGNOSIS — G4733 Obstructive sleep apnea (adult) (pediatric): Secondary | ICD-10-CM

## 2016-11-27 DIAGNOSIS — N83202 Unspecified ovarian cyst, left side: Secondary | ICD-10-CM

## 2016-11-27 HISTORY — DX: Obstructive sleep apnea (adult) (pediatric): G47.33

## 2016-11-27 LAB — CBC WITH DIFFERENTIAL/PLATELET
Basophils Absolute: 0.1 10*3/uL (ref 0.0–0.1)
Basophils Relative: 0.7 % (ref 0.0–3.0)
Eosinophils Absolute: 0.3 10*3/uL (ref 0.0–0.7)
Eosinophils Relative: 2.8 % (ref 0.0–5.0)
HCT: 39.5 % (ref 36.0–46.0)
Hemoglobin: 12.8 g/dL (ref 12.0–15.0)
Lymphocytes Relative: 25.6 % (ref 12.0–46.0)
Lymphs Abs: 2.3 10*3/uL (ref 0.7–4.0)
MCHC: 32.4 g/dL (ref 30.0–36.0)
MCV: 85.5 fl (ref 78.0–100.0)
Monocytes Absolute: 0.5 10*3/uL (ref 0.1–1.0)
Monocytes Relative: 5.8 % (ref 3.0–12.0)
Neutro Abs: 5.9 10*3/uL (ref 1.4–7.7)
Neutrophils Relative %: 65.1 % (ref 43.0–77.0)
Platelets: 366 10*3/uL (ref 150.0–400.0)
RBC: 4.63 Mil/uL (ref 3.87–5.11)
RDW: 14.5 % (ref 11.5–15.5)
WBC: 9.1 10*3/uL (ref 4.0–10.5)

## 2016-11-27 LAB — LIPID PANEL
Cholesterol: 159 mg/dL (ref 0–200)
HDL: 42.6 mg/dL (ref >39.00)
LDL Cholesterol: 92 mg/dL (ref 0–99)
NonHDL: 116.21
Total CHOL/HDL Ratio: 4
Triglycerides: 122 mg/dL (ref 0.0–149.0)
VLDL: 24.4 mg/dL (ref 0.0–40.0)

## 2016-11-27 LAB — HEMOGLOBIN A1C: Hgb A1c MFr Bld: 6.4 % (ref 4.6–6.5)

## 2016-11-27 LAB — COMPREHENSIVE METABOLIC PANEL
ALT: 38 U/L — ABNORMAL HIGH (ref 0–35)
AST: 37 U/L (ref 0–37)
Albumin: 4.3 g/dL (ref 3.5–5.2)
Alkaline Phosphatase: 43 U/L (ref 39–117)
BUN: 9 mg/dL (ref 6–23)
CO2: 27 mEq/L (ref 19–32)
Calcium: 9.3 mg/dL (ref 8.4–10.5)
Chloride: 97 mEq/L (ref 96–112)
Creatinine, Ser: 0.57 mg/dL (ref 0.40–1.20)
GFR: 123.47 mL/min (ref >60.00)
Glucose, Bld: 100 mg/dL — ABNORMAL HIGH (ref 70–99)
Potassium: 4.2 mEq/L (ref 3.5–5.1)
Sodium: 135 mEq/L (ref 135–145)
Total Bilirubin: 0.4 mg/dL (ref 0.2–1.2)
Total Protein: 7 g/dL (ref 6.0–8.3)

## 2016-11-27 LAB — T4, FREE: Free T4: 0.91 ng/dL (ref 0.60–1.60)

## 2016-11-27 LAB — TSH: TSH: 3.87 u[IU]/mL (ref 0.35–4.50)

## 2016-11-27 LAB — VITAMIN B12: Vitamin B-12: 816 pg/mL (ref 211–911)

## 2016-11-27 LAB — VITAMIN D 25 HYDROXY (VIT D DEFICIENCY, FRACTURES): VITD: 16.77 ng/mL — ABNORMAL LOW (ref 30.00–100.00)

## 2016-11-27 MED ORDER — NORETHINDRONE ACET-ETHINYL EST 1.5-30 MG-MCG PO TABS: 1.0000 | ORAL_TABLET | Freq: Every day | ORAL | 11 refills | Status: DC

## 2016-11-27 NOTE — Patient Instructions (Addendum)
Debra West, Naturopath Debra West, Physiotherapist Chiropractor, [employer]

## 2016-11-27 NOTE — Progress Notes (Addendum)
Subjective:    Debra West, CMA, input Dr.Iyonnah Ferrante's CPE template. Other work completed by Dr. Earlene Plater.  Debra West is a 41 y.o. female and is here for a comprehensive physical exam.  Pertinent Gynecological History: Patient's last menstrual period was 11/06/2016. Menses: with severe dysmenorrhea  PMHx, SurgHx, SocialHx, Medications, and Allergies were reviewed in the Visit Navigator and updated as appropriate.   Past Medical History:  Diagnosis Date  . Anxiety   . Cardiac arrhythmia   . Chronic headaches   . GERD (gastroesophageal reflux disease)   . Hypothyroidism 01/17/2016  . Left ovarian cyst 03/16/2014   Overview:  02/2014 2.3 x 2.1 03/15/14 4 x 4.1 septated labs pending started on OCP's  . Migraine without aura and without status migrainosus, not intractable   . Obstructive sleep apnea syndrome 11/27/2016  . Pernicious anemia   . Vasodepressor syncope   . 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 CHOLECYSTECTOMY  2004  . OVARIAN CYST REMOVAL Left   . WISDOM TOOTH EXTRACTION     Family History  Problem Relation Age of Onset  . Thyroid disease Mother   . Heart disease Father   . Stroke Father   . Heart attack Father   . Thyroid disease Sister   . Lung cancer Maternal Grandfather   . Heart disease Maternal Grandfather   . Diabetes Paternal Grandmother   . Thyroid cancer Paternal Grandmother   . Heart disease Paternal Grandfather   . Breast cancer Paternal Aunt   . Bladder Cancer Paternal Aunt   . Thyroid disease Paternal Aunt   . Cancer Paternal Aunt    Social History  Substance Use Topics  . Smoking status: Never Smoker  . Smokeless tobacco: Never Used  . Alcohol use Yes     Comment: Occasionally   Review of Systems:   Pertinent items are noted in the HPI. Otherwise, ROS is negative.  Objective:   BP 124/86   Pulse 85   Temp 98.4 F (36.9 C) (Oral)   Ht 5' 6.5" (1.689 m)   Wt 256 lb 9.6 oz  (116.4 kg)   LMP 11/06/2016   SpO2 96%   BMI 40.80 kg/m    Wt Readings from Last 3 Encounters:  11/27/16 256 lb 9.6 oz (116.4 kg)  09/08/16 253 lb (114.8 kg)  08/15/16 248 lb (112.5 kg)     General appearance: alert, cooperative and appears stated age. Head: normocephalic, without obvious abnormality, atraumatic. Neck: no adenopathy, supple, symmetrical, trachea midline; thyroid not enlarged, symmetric, no tenderness/mass/nodules. Lungs: clear to auscultation bilaterally. Breasts: inspection negative, no nipple retraction or dimpling, no nipple discharge or bleeding, no axillary or supraclavicular adenopathy, normal to palpation without dominant masses. Heart: regular rate and rhythm Abdomen: soft, non-tender; no masses,  no organomegaly. Extremities: extremities normal, atraumatic, no cyanosis or edema. Skin: skin color, texture, turgor normal, no rashes or lesions. Lymph: cervical, supraclavicular, and axillary nodes normal; no abnormal inguinal nodes palpated. Neurologic: grossly normal.  Assessment/Plan:   Debra West was seen today for annual exam.  Diagnoses and all orders for this visit:  Routine physical examination -     Lipid panel -     Hemoglobin A1c  Hypothyroidism, unspecified type -     TSH -     T4, free  Fatigue, unspecified type -     CBC with Differential/Platelet -     Comprehensive metabolic panel -     TSH -  T4, free -     Vitamin B12 -     VITAMIN D 25 Hydroxy (Vit-D Deficiency, Fractures) -     Hemoglobin A1c  Morbid obesity (HCC) -     CBC with Differential/Platelet -     Comprehensive metabolic panel -     TSH -     T4, free -     Lipid panel -     Vitamin B12 -     VITAMIN D 25 Hydroxy (Vit-D Deficiency, Fractures) -     Hemoglobin A1c  Left ovarian cyst -     Norethindrone Acetate-Ethinyl Estradiol (LOESTRIN 1.5/30, 21,) 1.5-30 MG-MCG tablet; Take 1 tablet by mouth daily.  Insomnia, unspecified type Comments: Sleep hygeine  reviewed. Okay to try Magnesium and Melatonin.  Vocal fold paresis, right Comments: Followed by ENT now. Working on breathing exercises.    Patient Counseling: [x]    Nutrition: Stressed importance of moderation in sodium/caffeine intake, saturated fat and cholesterol, caloric balance, sufficient intake of fresh fruits, vegetables, fiber, calcium, iron, and 1 mg of folate supplement per day (for females capable of pregnancy).  [x]    Stressed the importance of regular exercise.   [x]    Substance Abuse: Discussed cessation/primary prevention of tobacco, alcohol, or other drug use; driving or other dangerous activities under the influence; availability of treatment for abuse.   [x]    Injury prevention: Discussed safety belts, safety helmets, smoke detector, smoking near bedding or upholstery.   [x]    Sexuality: Discussed sexually transmitted diseases, partner selection, use of condoms, avoidance of unintended pregnancy  and contraceptive alternatives.  [x]    Dental health: Discussed importance of regular tooth brushing, flossing, and dental visits.  [x]    Health maintenance and immunizations reviewed. Please refer to Health maintenance section.   Helane Rima, DO [facility]  CMA input template. All other documentation performed by medical provider. The above documentation has been reviewed and is accurate and complete. Helane Rima, D.O.

## 2016-12-03 ENCOUNTER — Ambulatory Visit: Payer: 59 | Admitting: Family Medicine

## 2016-12-04 ENCOUNTER — Ambulatory Visit (INDEPENDENT_AMBULATORY_CARE_PROVIDER_SITE_OTHER): Payer: 59 | Admitting: Family Medicine

## 2016-12-04 ENCOUNTER — Encounter: Payer: Self-pay | Admitting: Family Medicine

## 2016-12-04 ENCOUNTER — Ambulatory Visit: Payer: 59 | Admitting: Family Medicine

## 2016-12-04 VITALS — BP 122/74 | HR 83 | Temp 98.8°F | Ht 66.5 in | Wt 256.0 lb

## 2016-12-04 DIAGNOSIS — E559 Vitamin D deficiency, unspecified: Secondary | ICD-10-CM

## 2016-12-04 DIAGNOSIS — R7303 Prediabetes: Secondary | ICD-10-CM | POA: Diagnosis not present

## 2016-12-04 MED ORDER — METFORMIN HCL 500 MG PO TABS: 500.0000 mg | ORAL_TABLET | Freq: Two times a day (BID) | ORAL | 3 refills | Status: DC

## 2016-12-04 MED ORDER — CHOLECALCIFEROL 1.25 MG (50000 UT) PO TABS: ORAL_TABLET | ORAL | 0 refills | Status: DC

## 2016-12-04 NOTE — Progress Notes (Signed)
Debra West is a 41 y.o. female is here for follow up.  History of Present Illness:   Insurance claims handlerAmber Agner, CMA, acting as scribe for Dr. Earlene PlaterWallace.  HPI: Patient here to review labs and discuss medication options. See below.  There are no preventive care reminders to display for this patient. Depression screen PHQ 2/9 05/15/2016  Decreased Interest 0  Down, Depressed, Hopeless 0  PHQ - 2 Score 0   PMHx, SurgHx, SocialHx, FamHx, Medications, and Allergies were reviewed in the Visit Navigator and updated as appropriate.   Patient Active Problem List   Diagnosis Date Noted  . Obstructive sleep apnea syndrome 11/27/2016  . 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 Pernicious anemia 05/16/2016  . Morbid obesity due to excess calories (HCC) 05/16/2016  . Migraine without aura and without status migrainosus, not intractable   . Gastroesophageal reflux disease   . Hypothyroidism 01/17/2016  . Left ovarian cyst 03/16/2014  . Diaphragmatic hernia 06/14/2010  . Adjustment disorder with depressed mood 07/12/2008  . 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   Social History  Substance Use Topics  . Smoking status: Never Smoker  . Smokeless tobacco: Never Used  . Alcohol use Yes     Comment: Occasionally   Current Medications and Allergies:   Current Outpatient Prescriptions:  .  cyclobenzaprine (FLEXERIL) 5 MG tablet, Take 1 tablet (5 mg total) by mouth 2 (two) times daily as needed for muscle spasms., Disp: 20 tablet, Rfl: 0 .  Diclofenac Sodium (PENNSAID) 2 % SOLN, Place 1 application onto the skin 2 (two) times daily as needed., Disp: 8 g, Rfl: 0 .  esomeprazole (NEXIUM) 40 MG capsule, Take 30-60 min before first meal of the day, Disp: 30 capsule, Rfl: 2 .  FLUoxetine (PROZAC) 20 MG capsule, TAKE 1 CAP BY MOUTH DAILY EVERY MORNING., Disp: 90 capsule, Rfl: 1 .  ibuprofen (ADVIL,MOTRIN) 200 MG tablet, Take 2-3 tablets (400-600 mg total) by mouth every 8 (eight) hours as needed for fever, headache,  mild pain, moderate pain or cramping., Disp: , Rfl:  .  Ipratropium-Albuterol (COMBIVENT RESPIMAT) 20-100 MCG/ACT AERS respimat, Inhale 1 puff into the lungs every 6 (six) hours as needed for wheezing or shortness of breath., Disp: 1 Inhaler, Rfl: 2 .  levothyroxine (SYNTHROID, LEVOTHROID) 50 MCG tablet, TAKE 1 TABLET BY MOUTH EVERY DAY, Disp: 90 tablet, Rfl: 2 .  midodrine (PROAMATINE) 5 MG tablet, Take 5 mg by mouth 3 (three) times daily as needed (for low blood pressure (less than 105)). , Disp: , Rfl:  .  Norethindrone Acetate-Ethinyl Estradiol (LOESTRIN 1.5/30, 21,) 1.5-30 MG-MCG tablet, Take 1 tablet by mouth daily., Disp: 1 Package, Rfl: 11 .  ranitidine (ZANTAC) 300 MG capsule, Take 1 capsule (300 mg total) by mouth every evening., Disp: 30 capsule, Rfl: 11 .  Cholecalciferol 50000 units TABS, 50,000 units PO qwk for 12 weeks., Disp: 12 tablet, Rfl: 0 .  metFORMIN (GLUCOPHAGE) 500 MG tablet, Take 1 tablet (500 mg total) by mouth 2 (two) times daily with a meal., Disp: 180 tablet, Rfl: 3   Allergies  Allergen Reactions  . Depo-Medrol [Methylprednisolone Acetate] Shortness Of Breath  . Aspirin Nausea And Vomiting   Review of Systems   Pertinent items are noted in the HPI. Otherwise, ROS is negative.  Vitals:   Vitals:   12/04/16 1111  BP: 122/74  Pulse: 83  Temp: 98.8 F (37.1 C)  TempSrc: Oral  SpO2: 97%  Weight: 256 lb (116.1 kg)  Height: 5' 6.5" (1.689 m)  Body mass index is 40.7 kg/m. Physical Exam:   Physical Exam  Constitutional: She appears well-nourished.  HENT:  Head: Normocephalic and atraumatic.  Eyes: Pupils are equal, round, and reactive to light. EOM are normal.  Neck: Normal range of motion. Neck supple.  Cardiovascular: Normal rate, regular rhythm, normal heart sounds and intact distal pulses.   Pulmonary/Chest: Effort normal.  Abdominal: Soft.  Skin: Skin is warm.  Psychiatric: She has a normal mood and affect. Her behavior is normal.  Nursing  note and vitals reviewed.  Results for orders placed or performed in visit on 11/27/16  CBC with Differential/Platelet  Result Value Ref Range   WBC 9.1 4.0 - 10.5 K/uL   RBC 4.63 3.87 - 5.11 Mil/uL   Hemoglobin 12.8 12.0 - 15.0 g/dL   HCT 19.1 47.8 - 29.5 %   MCV 85.5 78.0 - 100.0 fl   MCHC 32.4 30.0 - 36.0 g/dL   RDW 62.1 30.8 - 65.7 %   Platelets 366.0 150.0 - 400.0 K/uL   Neutrophils Relative % 65.1 43.0 - 77.0 %   Lymphocytes Relative 25.6 12.0 - 46.0 %   Monocytes Relative 5.8 3.0 - 12.0 %   Eosinophils Relative 2.8 0.0 - 5.0 %   Basophils Relative 0.7 0.0 - 3.0 %   Neutro Abs 5.9 1.4 - 7.7 K/uL   Lymphs Abs 2.3 0.7 - 4.0 K/uL   Monocytes Absolute 0.5 0.1 - 1.0 K/uL   Eosinophils Absolute 0.3 0.0 - 0.7 K/uL   Basophils Absolute 0.1 0.0 - 0.1 K/uL  Comprehensive metabolic panel  Result Value Ref Range   Sodium 135 135 - 145 mEq/L   Potassium 4.2 3.5 - 5.1 mEq/L   Chloride 97 96 - 112 mEq/L   CO2 27 19 - 32 mEq/L   Glucose, Bld 100 (H) 70 - 99 mg/dL   BUN 9 6 - 23 mg/dL   Creatinine, Ser 8.46 0.40 - 1.20 mg/dL   Total Bilirubin 0.4 0.2 - 1.2 mg/dL   Alkaline Phosphatase 43 39 - 117 U/L   AST 37 0 - 37 U/L   ALT 38 (H) 0 - 35 U/L   Total Protein 7.0 6.0 - 8.3 g/dL   Albumin 4.3 3.5 - 5.2 g/dL   Calcium 9.3 8.4 - 96.2 mg/dL   GFR 952.84 >13.24 mL/min  TSH  Result Value Ref Range   TSH 3.87 0.35 - 4.50 uIU/mL  T4, free  Result Value Ref Range   Free T4 0.91 0.60 - 1.60 ng/dL  Lipid panel  Result Value Ref Range   Cholesterol 159 0 - 200 mg/dL   Triglycerides 401.0 0.0 - 149.0 mg/dL   HDL 27.25 >36.64 mg/dL   VLDL 40.3 0.0 - 47.4 mg/dL   LDL Cholesterol 92 0 - 99 mg/dL   Total CHOL/HDL Ratio 4    NonHDL 116.21   Vitamin B12  Result Value Ref Range   Vitamin B-12 816 211 - 911 pg/mL  VITAMIN D 25 Hydroxy (Vit-D Deficiency, Fractures)  Result Value Ref Range   VITD 16.77 (L) 30.00 - 100.00 ng/mL  Hemoglobin A1c  Result Value Ref Range   Hgb A1c MFr Bld 6.4  4.6 - 6.5 %    Assessment and Plan:   Debra West was seen today for follow-up.  Diagnoses and all orders for this visit:  Vitamin D deficiency -     Cholecalciferol 50000 units TABS; 50,000 units PO qwk for 12 weeks.  Prediabetes Comments: The patient is asked to  make an attempt to improve diet and exercise patterns to aid in medical management of this problem.  Orders: -     metFORMIN (GLUCOPHAGE) 500 MG tablet; Take 1 tablet (500 mg total) by mouth 2 (two) times daily with a meal.  . Reviewed expectations re: course of current medical issues. . Discussed self-management of symptoms. . Outlined signs and symptoms indicating need for more acute intervention. . Patient verbalized understanding and all questions were answered. Marland Kitchen Health Maintenance issues including appropriate healthy diet, exercise, and smoking avoidance were discussed with patient. . See orders for this visit as documented in the electronic medical record. . Patient received an After Visit Summary.  CMA served as Neurosurgeon during this visit. History, Physical, and Plan performed by medical provider. The above documentation has been reviewed and is accurate and complete. Helane Rima, D.O.  Helane Rima, DO [place], [place] 12/04/2016  No future appointments.

## 2017-01-30 ENCOUNTER — Other Ambulatory Visit: Payer: Self-pay | Admitting: Internal Medicine

## 2017-03-01 ENCOUNTER — Other Ambulatory Visit: Payer: Self-pay | Admitting: Family Medicine

## 2017-03-02 NOTE — Telephone Encounter (Signed)
Please advise on refill. Patient has not been seen since 12/04/16.

## 2017-03-02 NOTE — Telephone Encounter (Signed)
Okay refill. Make sure she has a follow up appointment.

## 2017-03-19 ENCOUNTER — Ambulatory Visit: Payer: 59 | Admitting: Family Medicine

## 2017-03-19 ENCOUNTER — Encounter: Payer: Self-pay | Admitting: Family Medicine

## 2017-03-19 VITALS — BP 130/82 | HR 96 | Temp 98.7°F | Wt 260.6 lb

## 2017-03-19 DIAGNOSIS — R21 Rash and other nonspecific skin eruption: Secondary | ICD-10-CM

## 2017-03-19 DIAGNOSIS — L209 Atopic dermatitis, unspecified: Secondary | ICD-10-CM | POA: Diagnosis not present

## 2017-03-19 MED ORDER — NYSTATIN 100000 UNIT/GM EX OINT: 1.0000 "application " | TOPICAL_OINTMENT | Freq: Two times a day (BID) | CUTANEOUS | 0 refills | Status: DC

## 2017-03-19 MED ORDER — TRIAMCINOLONE ACETONIDE 0.025 % EX OINT: 1.0000 "application " | TOPICAL_OINTMENT | Freq: Two times a day (BID) | CUTANEOUS | 0 refills | Status: DC

## 2017-03-19 NOTE — Patient Instructions (Signed)
ECZEMA INFORMATION  Eczema, or Atopic Dermatitis, is a common, chronic inflammatory skin condition characterized by dry, itchy patches of skin with a chronic relapsing course. Eczema has multiple causative factors, such as, genetic susceptibility and environmental triggers, like exposure to irritants (i.e. frequent hand-washing, or showers, harsh soaps, nickel in jewelry). Eczema may occur anywhere on the skin, however, the most common areas include the face, neck, hands, inner elbows, behind the knees, ankles and feet. The main approach to treating Eczema is avoiding triggers that may cause acute flares.   As a daily skin care regimen:  . Use non irritant soaps (Dove, Cetaphil, CeraVe, Aveeno ).  . Avoid long, hot showers or baths; instead use lukewarm water.  Dennie Bible. Pat dry your skin after your shower.  Marland Kitchen. Apply thick, moisturizing creams such as Cetaphil, CeraVe, Eucerin, Aquaphor. Decreasing dryness with moisturizers will improve the barrier function of the skin and help improve symptoms of itching and pain.   During eczematous flares:  . Apply the prescribed topical medicine twice daily, or as directed. Take any oral antibiotics as directed.  . Once the dry patch has cleared, continue to apply non-prescription moisturizers twice a day.  . At times, over-the-counter oral anti-histamines (Zyrtec, Claritin) or prescription strength anti-histamines may relieve the itching, thus preventing itch/scratch cycles.   Other helpful ways to control eczema:  . Avoid frequent hand washing.  . Avoid harsh, rough clothing (i.e. wool, polyester).  . Avoid perfumes, colognes, fragranced skin products.  . Avoid fabric softeners and anti-static dyer sheets.  . Avoid sweating, especially at night.  . Humidify homes during the winter.  . Vacuum drapes, rugs, under beds frequently.  . Minimize animal dander (cats, dogs, pet rodents).  . If you must expose your hands to irritants, try to wear gloves.  Consider using white cotton gloves (www.allerderm.com), even inside plastic gloves.

## 2017-03-22 NOTE — Progress Notes (Signed)
Debra West is a 41 y.o. female here for an acute visit.  History of Present Illness:   HPI:   Rash Patient presents for evaluation of a rash involving the lower leg, trunk and upper extremity. Rash started several weeks ago. Lesions are pink, and raised in texture. Rash has changed over time. Rash is pruritic. Associated symptoms: none. Patient denies: abdominal pain, arthralgia, congestion, cough, crankiness, decrease in appetite, decrease in energy level, fever, headache, irritability, myalgia, nausea, sore throat and vomiting. Patient has not had contacts with similar rash. Patient has not had new exposures (soaps, lotions, laundry detergents, foods, medications, plants, insects or animals).  PMHx, SurgHx, SocialHx, Medications, and Allergies were reviewed in the Visit Navigator and updated as appropriate.  Current Medications:   .  Cholecalciferol 50000 units TABS, 50,000 units PO qwk for 12 weeks., Disp: 12 tablet, Rfl: 0 .  cyclobenzaprine (FLEXERIL) 5 MG tablet, Take 1 tablet (5 mg total) by mouth 2 (two) times daily as needed for muscle spasms., Disp: 20 tablet, Rfl: 0 .  Diclofenac Sodium (PENNSAID) 2 % SOLN, Place 1 application onto the skin 2 (two) times daily as needed., Disp: 8 g, Rfl: 0 .  esomeprazole (NEXIUM) 40 MG capsule, Take 30-60 min before first meal of the day, Disp: 30 capsule, Rfl: 2 .  FLUoxetine (PROZAC) 20 MG capsule, TAKE 1 CAPSULE BY MOUTH DAILY EVERY MORNING (MAX #30 ON INS), Disp: 90 capsule, Rfl: 0 .  ibuprofen (ADVIL,MOTRIN) 200 MG tablet, Take 2-3 tablets (400-600 mg total) by mouth every 8 (eight) hours as needed for fever, headache, mild pain, moderate pain or cramping., Disp: , Rfl:  .  levothyroxine (SYNTHROID, LEVOTHROID) 50 MCG tablet, TAKE 1 TABLET BY MOUTH EVERY DAY, Disp: 90 tablet, Rfl: 2 .  metFORMIN (GLUCOPHAGE) 500 MG tablet, Take 1 tablet (500 mg total) by mouth 2 (two) times daily with a meal., Disp: 180 tablet, Rfl: 3 .  midodrine  (PROAMATINE) 5 MG tablet, Take 5 mg by mouth 3 (three) times daily as needed (for low blood pressure (less than 105)). , Disp: , Rfl:  .  Norethindrone Acetate-Ethinyl Estradiol (LOESTRIN 1.5/30, 21,) 1.5-30 MG-MCG tablet, Take 1 tablet by mouth daily., Disp: 1 Package, Rfl: 11 .  pantoprazole (PROTONIX) 40 MG tablet, TAKE 1 TABLET (40 MG TOTAL) BY MOUTH DAILY. TAKE 30-60 MIN BEFORE FIRST MEAL OF THE DAY, Disp: 60 tablet, Rfl: 2  Allergies  Allergen Reactions  . Depo-Medrol [Methylprednisolone Acetate] Shortness Of Breath  . Aspirin Nausea And Vomiting   Review of Systems:   Pertinent items are noted in the HPI. Otherwise, ROS is negative.  Vitals:   Vitals:   03/19/17 1306  BP: 130/82  Pulse: 96  Temp: 98.7 F (37.1 C)  TempSrc: Oral  SpO2: 97%  Weight: 260 lb 9.6 oz (118.2 kg)     Body mass index is 41.43 kg/m. Physical Exam:   Physical Exam  Constitutional: She appears well-nourished.  HENT:  Head: Normocephalic and atraumatic.  Eyes: EOM are normal. Pupils are equal, round, and reactive to light.  Neck: Normal range of motion. Neck supple.  Cardiovascular: Normal rate, regular rhythm, normal heart sounds and intact distal pulses.  Pulmonary/Chest: Effort normal.  Abdominal: Soft.  Skin: Skin is warm. Rash noted. Rash is urticarial. There is erythema.     Psychiatric: She has a normal mood and affect. Her behavior is normal.  Nursing note and vitals reviewed.   Lab Results  Component Value Date   HGBA1C  6.4 11/27/2016   Assessment and Plan:   [name] was seen today for rash.  Diagnoses and all orders for this visit:  Rash Comments: Consistent with candida lower abdomen. Dry spots on other areas consistent with atopic dermatitis.  Orders: -     nystatin ointment (MYCOSTATIN); Apply 1 application topically 2 (two) times daily. -     triamcinolone (KENALOG) 0.025 % ointment; Apply 1 application topically 2 (two) times daily.  Atopic dermatitis,  mild Comments: See AVS.   . Reviewed expectations re: course of current medical issues. . Discussed self-management of symptoms. . Outlined signs and symptoms indicating need for more acute intervention. . Patient verbalized understanding and all questions were answered. Debra West. Health Maintenance issues including appropriate healthy diet, exercise, and smoking avoidance were discussed with patient. . See orders for this visit as documented in the electronic medical record. . Patient received an After Visit Summary.  Debra RimaErica Zorina Mallin, DO [place], [place] 03/22/2017  Future Appointments  Date Time Provider Department Center  03/27/2017  9:30 AM Willodean RosenthalHarraway-Smith, Carolyn, MD [facility] None

## 2017-03-27 ENCOUNTER — Ambulatory Visit (HOSPITAL_BASED_OUTPATIENT_CLINIC_OR_DEPARTMENT_OTHER)
Admission: RE | Admit: 2017-03-27 | Discharge: 2017-03-27 | Disposition: A | Discharge status: Home / Self Care | Payer: 59 | Source: Ambulatory Visit | Attending: Obstetrics & Gynecology | Admitting: Obstetrics & Gynecology

## 2017-03-27 ENCOUNTER — Ambulatory Visit (INDEPENDENT_AMBULATORY_CARE_PROVIDER_SITE_OTHER): Payer: 59 | Admitting: Obstetrics & Gynecology

## 2017-03-27 ENCOUNTER — Encounter: Payer: Self-pay | Admitting: Obstetrics & Gynecology

## 2017-03-27 VITALS — BP 131/84 | HR 89 | Ht 66.5 in | Wt 261.0 lb

## 2017-03-27 DIAGNOSIS — Z1231 Encounter for screening mammogram for malignant neoplasm of breast: Secondary | ICD-10-CM | POA: Diagnosis present

## 2017-03-27 DIAGNOSIS — Z1151 Encounter for screening for human papillomavirus (HPV): Secondary | ICD-10-CM

## 2017-03-27 DIAGNOSIS — Z124 Encounter for screening for malignant neoplasm of cervix: Secondary | ICD-10-CM

## 2017-03-27 DIAGNOSIS — Z1239 Encounter for other screening for malignant neoplasm of breast: Secondary | ICD-10-CM

## 2017-03-27 DIAGNOSIS — Z01419 Encounter for gynecological examination (general) (routine) without abnormal findings: Secondary | ICD-10-CM | POA: Diagnosis not present

## 2017-03-27 DIAGNOSIS — N83202 Unspecified ovarian cyst, left side: Secondary | ICD-10-CM

## 2017-03-27 MED ORDER — FLUCONAZOLE 150 MG PO TABS: 150.0000 mg | ORAL_TABLET | ORAL | 0 refills | Status: DC

## 2017-03-27 NOTE — Progress Notes (Signed)
Subjective:     Debra West is a 41 y.o. female here for a routine exam.  Current complaints: Pt reports occ pain in her pelvis. She reports that the pain is well controlled on OCPs. She notes that the pain comes back during the inactive pills. She was taken off of the OCPs by cardiology for 3 months to see if it was causing any symptoms. Pt is not sexually active.  Pt is a virgin.  Pt as a h/o of left ovarian cyst that was removed in 2010 in [place]. In 2014 she began having problems again. The pain now is intermittent and relieved with a heating pad. The OCPs seem to keep her sx under contol.       Pt has an intermittent vulvar rash for 2 months. Has used OTC meds. She has treated it as yeast. Has some benefits but, is not sure if that's what it is. Did get Nystatin by her primary care provider and it is helping. Its been present for 1 weeks.      Gynecologic History No LMP recorded. Contraception: OCP (estrogen/progesterone) Last Pap: unknown. Results were: WNL. Pt has never had an abnormal PAP  Last mammogram: 03/18/2016. Results were: normal  Obstetric History OB History  Gravida Para Term Preterm AB Living  0 0 0 0 0 0  SAB TAB Ectopic Multiple Live Births  0 0 0 0 0       The following portions of the patient's history were reviewed and updated as appropriate: allergies, current medications, past family history, past medical history, past social history, past surgical history and problem list.  Review of Systems Pertinent items are noted in HPI.    Objective:  BP 131/84   Pulse 89   Ht 5' 6.5" (1.689 m)   Wt 261 lb (118.4 kg)   BMI 41.50 kg/m   General Appearance:    Alert, cooperative, no distress, appears stated age  Head:    Normocephalic, without obvious abnormality, atraumatic  Eyes:    conjunctiva/corneas clear, EOM's intact, both eyes  Ears:    Normal external ear canals, both ears  Nose:   Nares normal, septum midline, mucosa normal, no drainage    or sinus  tenderness  Throat:   Lips, mucosa, and tongue normal; teeth and gums normal  Neck:   Supple, symmetrical, trachea midline, no adenopathy;    thyroid:  no enlargement/tenderness/nodules  Back:     Symmetric, no curvature, ROM normal, no CVA tenderness  Lungs:     Clear to auscultation bilaterally, respirations unlabored  Chest Wall:    No tenderness or deformity   Heart:    Regular rate and rhythm, S1 and S2 normal, no murmur, rub   or gallop  Breast Exam:    No tenderness, masses, or nipple abnormality  Abdomen:     Soft, non-tender, bowel sounds active all four quadrants,    no masses, no organomegaly  Genitalia:    Normal female without lesion, discharge or tenderness     Extremities:   Extremities normal, atraumatic, no cyanosis or edema  Pulses:   2+ and symmetric all extremities  Skin:   Skin color, texture, turgor normal, no rashes or lesions      05/19/2016 CLINICAL DATA:  Left lower quadrant pain x2 days. Possible left-sided hydrosalpinx per same day CT.  EXAM: TRANSABDOMINAL AND TRANSVAGINAL ULTRASOUND OF PELVIS  DOPPLER ULTRASOUND OF OVARIES  TECHNIQUE: Both transabdominal and transvaginal ultrasound examinations of the pelvis were performed. Transabdominal  technique was performed for global imaging of the pelvis including uterus, ovaries, adnexal regions, and pelvic cul-de-sac.  It was necessary to proceed with endovaginal exam following the transabdominal exam to visualize the ovaries. Color and duplex Doppler ultrasound was utilized to evaluate blood flow to the ovaries.  COMPARISON:  05/19/2016 CT  FINDINGS: Uterus  Measurements: 7.3 x 2.7 x 4.1 cm. No fibroids or other mass visualized. Sliver fluid in the endocervical canal.  Endometrium  Thickness: 5.1 mm.  No focal abnormality visualized.  Right ovary  Measurements: 2.8 x 2.7 x 1.8 cm. Normal appearance/no adnexal mass.  Left ovary  Measurements: 4.1 x 2.9 x 3.1 cm with  homogeneous hypoechoic complex cyst possibly an endometrioma measuring 3 x 2 x 1.9 cm. Normal appearance/no adnexal mass.  Pulsed Doppler evaluation of both ovaries demonstrates normal low-resistance arterial and venous waveforms.  Other findings  No hydrosalpinx identified.  IMPRESSION: 1. Complex left ovarian cyst measuring 3 x 2 x 1.9 cm possibly representing an endometrioma given its homogeneous hypoechoic appearance. Follow-up ultrasound in 6-12 weeks recommended per Management of Asymptomatic Ovarian and Other Adnexal Cysts Imaged at US: Society of Radiologists in Ultrasound Consensus Conference Statement, Sept, 2010. 2. No findings of ovarian torsion.  No hydrosalpinx Assessment:    Healthy female exam.   Yeast infection on abd and vulva Left ovarian cyst   Plan:    Mammogram ordered. Follow up in: 1 year.    Diflucan 150mg  po q 3 days for 3 doses Baking soda to affected areas Pelvic US to eval left ov cyst.  F/u PAP with hr HPV  [name], M.D., [facility] CoreFACOG

## 2017-03-27 NOTE — Patient Instructions (Signed)
Ovarian Cyst An ovarian cyst is a fluid-filled sac that forms on an ovary. The ovaries are small organs that produce eggs in women. Various types of cysts can form on the ovaries. Some may cause symptoms and require treatment. Most ovarian cysts go away on their own, are not cancerous (are benign), and do not cause problems. Common types of ovarian cysts include:  Functional (follicle) cysts. ? Occur during the menstrual cycle, and usually go away with the next menstrual cycle if you do not get pregnant. ? Usually cause no symptoms.  Endometriomas. ? Are cysts that form from the tissue that lines the uterus (endometrium). ? Are sometimes called "chocolate cysts" because they become filled with blood that turns brown. ? Can cause pain in the lower abdomen during intercourse and during your period.  Cystadenoma cysts. ? Develop from cells on the outside surface of the ovary. ? Can get very large and cause lower abdomen pain and pain with intercourse. ? Can cause severe pain if they twist or break open (rupture).  Dermoid cysts. ? Are sometimes found in both ovaries. ? May contain different kinds of body tissue, such as skin, teeth, hair, or cartilage. ? Usually do not cause symptoms unless they get very big.  Theca lutein cysts. ? Occur when too much of a certain hormone (human chorionic gonadotropin) is produced and overstimulates the ovaries to produce an egg. ? Are most common after having procedures used to assist with the conception of a baby (in vitro fertilization).  What are the causes? Ovarian cysts may be caused by:  Ovarian hyperstimulation syndrome. This is a condition that can develop from taking fertility medicines. It causes multiple large ovarian cysts to form.  Polycystic ovarian syndrome (PCOS). This is a common hormonal disorder that can cause ovarian cysts, as well as problems with your period or fertility.  What increases the risk? The following factors may make  you more likely to develop ovarian cysts:  Being overweight or obese.  Taking fertility medicines.  Taking certain forms of hormonal birth control.  Smoking.  What are the signs or symptoms? Many ovarian cysts do not cause symptoms. If symptoms are present, they may include:  Pelvic pain or pressure.  Pain in the lower abdomen.  Pain during sex.  Abdominal swelling.  Abnormal menstrual periods.  Increasing pain with menstrual periods.  How is this diagnosed? These cysts are commonly found during a routine pelvic exam. You may have tests to find out more about the cyst, such as:  Ultrasound.  X-ray of the pelvis.  CT scan.  MRI.  Blood tests.  How is this treated? Many ovarian cysts go away on their own without treatment. Your health care provider may want to check your cyst regularly for 2-3 months to see if it changes. If you are in menopause, it is especially important to have your cyst monitored closely because menopausal women have a higher rate of ovarian cancer. When treatment is needed, it may include:  Medicines to help relieve pain.  A procedure to drain the cyst (aspiration).  Surgery to remove the whole cyst.  Hormone treatment or birth control pills. These methods are sometimes used to help dissolve a cyst.  Follow these instructions at home:  Take over-the-counter and prescription medicines only as told by your health care provider.  Do not drive or use heavy machinery while taking prescription pain medicine.  Get regular pelvic exams and Pap tests as often as told by your health care   provider.  Return to your normal activities as told by your health care provider. Ask your health care provider what activities are safe for you.  Do not use any products that contain nicotine or tobacco, such as cigarettes and e-cigarettes. If you need help quitting, ask your health care provider.  Keep all follow-up visits as told by your health care provider.  This is important. Contact a health care provider if:  Your periods are late, irregular, or painful, or they stop.  You have pelvic pain that does not go away.  You have pressure on your bladder or trouble emptying your bladder completely.  You have pain during sex.  You have any of the following in your abdomen: ? A feeling of fullness. ? Pressure. ? Discomfort. ? Pain that does not go away. ? Swelling.  You feel generally ill.  You become constipated.  You lose your appetite.  You develop severe acne.  You start to have more body hair and facial hair.  You are gaining weight or losing weight without changing your exercise and eating habits.  You think you may be pregnant. Get help right away if:  You have abdominal pain that is severe or gets worse.  You cannot eat or drink without vomiting.  You suddenly develop a fever.  Your menstrual period is much heavier than usual. This information is not intended to replace advice given to you by your health care provider. Make sure you discuss any questions you have with your health care provider. Document Released: 03/24/2005 Document Revised: 10/12/2015 Document Reviewed: 08/26/2015 Elsevier Interactive Patient Education  2018 [employer]  

## 2017-03-28 ENCOUNTER — Other Ambulatory Visit: Payer: Self-pay | Admitting: Family Medicine

## 2017-03-28 DIAGNOSIS — E559 Vitamin D deficiency, unspecified: Secondary | ICD-10-CM

## 2017-03-30 NOTE — Telephone Encounter (Signed)
Take daily vitamin D 1000-2000 IU.

## 2017-03-30 NOTE — Telephone Encounter (Signed)
Please advise on refill.

## 2017-04-02 LAB — CYTOLOGY - PAP
DIAGNOSIS: UNDETERMINED — AB
HPV (WINDOPATH): NOT DETECTED

## 2017-04-08 ENCOUNTER — Other Ambulatory Visit: Payer: Self-pay | Admitting: Obstetrics & Gynecology

## 2017-04-08 ENCOUNTER — Ambulatory Visit (HOSPITAL_BASED_OUTPATIENT_CLINIC_OR_DEPARTMENT_OTHER)
Admission: RE | Admit: 2017-04-08 | Discharge: 2017-04-08 | Disposition: A | Discharge status: Home / Self Care | Payer: Managed Care, Other (non HMO) | Source: Ambulatory Visit | Attending: Obstetrics & Gynecology | Admitting: Obstetrics & Gynecology

## 2017-04-08 ENCOUNTER — Ambulatory Visit (HOSPITAL_BASED_OUTPATIENT_CLINIC_OR_DEPARTMENT_OTHER): Admission: RE | Admit: 2017-04-08 | Payer: Self-pay | Source: Ambulatory Visit

## 2017-04-08 DIAGNOSIS — N83202 Unspecified ovarian cyst, left side: Secondary | ICD-10-CM | POA: Diagnosis present

## 2017-04-08 DIAGNOSIS — N83292 Other ovarian cyst, left side: Secondary | ICD-10-CM | POA: Diagnosis not present

## 2017-04-09 ENCOUNTER — Ambulatory Visit (HOSPITAL_BASED_OUTPATIENT_CLINIC_OR_DEPARTMENT_OTHER): Payer: 59

## 2017-04-09 ENCOUNTER — Other Ambulatory Visit: Payer: Self-pay | Admitting: Obstetrics & Gynecology

## 2017-04-09 DIAGNOSIS — N83209 Unspecified ovarian cyst, unspecified side: Secondary | ICD-10-CM

## 2017-04-13 ENCOUNTER — Encounter: Payer: Self-pay | Admitting: Obstetrics & Gynecology

## 2017-04-13 ENCOUNTER — Encounter: Payer: Self-pay | Admitting: Family Medicine

## 2017-04-15 ENCOUNTER — Ambulatory Visit: Payer: Managed Care, Other (non HMO) | Admitting: Family Medicine

## 2017-04-15 VITALS — BP 126/82 | HR 93 | Temp 98.8°F | Ht 66.5 in | Wt 260.6 lb

## 2017-04-15 DIAGNOSIS — R21 Rash and other nonspecific skin eruption: Secondary | ICD-10-CM | POA: Diagnosis not present

## 2017-04-15 DIAGNOSIS — R7303 Prediabetes: Secondary | ICD-10-CM

## 2017-04-15 LAB — POCT GLYCOSYLATED HEMOGLOBIN (HGB A1C): Hemoglobin A1C: 6

## 2017-04-15 MED ORDER — PREDNISONE 5 MG PO TABS: 5.0000 mg | ORAL_TABLET | Freq: Every day | ORAL | 0 refills | Status: DC

## 2017-04-15 MED ORDER — HYDROXYZINE HCL 25 MG PO TABS: 25.0000 mg | ORAL_TABLET | Freq: Three times a day (TID) | ORAL | 0 refills | Status: DC | PRN

## 2017-04-15 NOTE — Progress Notes (Signed)
Debra West is a 42 y.o. female is here for follow up.  History of Present Illness:   Britt Bottom CMA acting as scribe for Dr. Earlene Plater.  HPI: See Assessment and Plan section for Problem Based Charting of issues discussed today.  Patient is coming in today for a rash that she was seen for in October. She stated that the rash has not gotten any better. Some areas are getting worse since last visit.   There are no preventive care reminders to display for this patient. Depression screen PHQ 2/9 05/15/2016  Decreased Interest 0  Down, Depressed, Hopeless 0  PHQ - 2 Score 0   PMHx, SurgHx, SocialHx, FamHx, Medications, and Allergies were reviewed in the Visit Navigator and updated as appropriate.   Patient Active Problem List   Diagnosis Date Noted  . Obstructive sleep apnea syndrome 11/27/2016  . 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 Pernicious anemia 05/16/2016  . Morbid obesity due to excess calories (HCC) 05/16/2016  . Migraine without aura and without status migrainosus, not intractable   . Gastroesophageal reflux disease   . Hypothyroidism 01/17/2016  . Left ovarian cyst 03/16/2014  . Diaphragmatic hernia 06/14/2010  . Adjustment disorder with depressed mood 07/12/2008  . 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   Social History   Tobacco Use  . Smoking status: Never Smoker  . Smokeless tobacco: Never Used  Substance Use Topics  . Alcohol use: Yes    Comment: Occasionally  . Drug use: No   Current Medications and Allergies:   Current Outpatient Medications:  .  Acetylcysteine 600 MG CAPS, Take by mouth., Disp: , Rfl:  .  Cholecalciferol 50000 units TABS, 50,000 units PO qwk for 12 weeks., Disp: 12 tablet, Rfl: 0 .  cyclobenzaprine (FLEXERIL) 5 MG tablet, Take 1 tablet (5 mg total) by mouth 2 (two) times daily as needed for muscle spasms., Disp: 20 tablet, Rfl: 0 .  Diclofenac Sodium (PENNSAID) 2 % SOLN, Place 1 application onto the skin 2 (two) times daily as needed.,  Disp: 8 g, Rfl: 0 .  esomeprazole (NEXIUM) 40 MG capsule, Take 30-60 min before first meal of the day, Disp: 30 capsule, Rfl: 2 .  fluconazole (DIFLUCAN) 150 MG tablet, Take 1 tablet (150 mg total) by mouth every 3 (three) days. For three doses, Disp: 3 tablet, Rfl: 0 .  FLUoxetine (PROZAC) 20 MG capsule, TAKE 1 CAPSULE BY MOUTH DAILY EVERY MORNING (MAX #30 ON INS), Disp: 90 capsule, Rfl: 0 .  ibuprofen (ADVIL,MOTRIN) 200 MG tablet, Take 2-3 tablets (400-600 mg total) by mouth every 8 (eight) hours as needed for fever, headache, mild pain, moderate pain or cramping., Disp: , Rfl:  .  levothyroxine (SYNTHROID, LEVOTHROID) 50 MCG tablet, TAKE 1 TABLET BY MOUTH EVERY DAY, Disp: 90 tablet, Rfl: 2 .  metFORMIN (GLUCOPHAGE) 500 MG tablet, Take 1 tablet (500 mg total) by mouth 2 (two) times daily with a meal., Disp: 180 tablet, Rfl: 3 .  midodrine (PROAMATINE) 5 MG tablet, Take 5 mg by mouth 3 (three) times daily as needed (for low blood pressure (less than 105)). , Disp: , Rfl:  .  Norethindrone Acetate-Ethinyl Estradiol (LOESTRIN 1.5/30, 21,) 1.5-30 MG-MCG tablet, Take 1 tablet by mouth daily., Disp: 1 Package, Rfl: 11 .  nystatin ointment (MYCOSTATIN), Apply 1 application topically 2 (two) times daily., Disp: 30 g, Rfl: 0 .  pantoprazole (PROTONIX) 40 MG tablet, TAKE 1 TABLET (40 MG TOTAL) BY MOUTH DAILY. TAKE 30-60 MIN BEFORE FIRST MEAL OF THE  DAY, Disp: 60 tablet, Rfl: 2   Allergies  Allergen Reactions  . Depo-Medrol [Methylprednisolone Acetate] Shortness Of Breath  . Aspirin Nausea And Vomiting   Review of Systems   Pertinent items are noted in the HPI. Otherwise, ROS is negative.  Vitals:   Vitals:   04/15/17 1444  BP: 126/82  Pulse: 93  Temp: 98.8 F (37.1 C)  TempSrc: Oral  SpO2: 97%  Weight: 260 lb 9.6 oz (118.2 kg)  Height: 5' 6.5" (1.689 m)     Body mass index is 41.43 kg/m.  Physical Exam:   Physical Exam  Constitutional: She appears well-nourished.  HENT:  Head:  Normocephalic and atraumatic.  Eyes: EOM are normal. Pupils are equal, round, and reactive to light.  Neck: Normal range of motion. Neck supple.  Cardiovascular: Normal rate, regular rhythm, normal heart sounds and intact distal pulses.  Pulmonary/Chest: Effort normal.  Abdominal: Soft.  Skin: Skin is warm.  Pic 1: Anterior leg. Pic 2: Left flank, panus.  Psychiatric: She has a normal mood and affect. Her behavior is normal.  Nursing note and vitals reviewed.       Results for orders placed or performed in visit on 04/15/17  POCT glycosylated hemoglobin (Hb A1C)  Result Value Ref Range   Hemoglobin A1C 6.0    Assessment and Plan:   1. Rash Previous treatment included nystatin and triamcinolone.  She was also seen by her GYN and treated with Diflucan for 3 days.  She does feel that the pannus rash and vulvar rash have improved slightly.  She continues to have the erythematous, raised, itchy rashes on her legs and left flank as above.  She cannot think of any new exposures.  She denies any changes to soaps, lotions, or detergents.  She has no new clothes that may be causing this issue.  She does have cats, but feels that they have not been contributing to this issue.  Her A1c is in the prediabetes range.  We will advance treatment today.  See below.  I will also go ahead and send her to dermatology for further help.  - hydrOXYzine (ATARAX/VISTARIL) 25 MG tablet; Take 1 tablet (25 mg total) by mouth every 8 (eight) hours as needed for itching (insomnia).  Dispense: 30 tablet; Refill: 0 - predniSONE (DELTASONE) 5 MG tablet; Take 1 tablet (5 mg total) by mouth daily with breakfast. 6-5-4-3-2-1-off  Dispense: 21 tablet; Refill: 0 - Ambulatory referral to Dermatology  2. Prediabetes - POCT glycosylated hemoglobin (Hb A1C)  3. Morbid obesity (HCC) The patient is asked to make an attempt to improve diet and exercise patterns to aid in medical management of this problem.    . Reviewed  expectations re: course of current medical issues. . Discussed self-management of symptoms. . Outlined signs and symptoms indicating need for more acute intervention. . Patient verbalized understanding and all questions were answered. Marland Kitchen. Health Maintenance issues including appropriate healthy diet, exercise, and smoking avoidance were discussed with patient. . See orders for this visit as documented in the electronic medical record. . Patient received an After Visit Summary.  CMA served as Neurosurgeonscribe during this visit. History, Physical, and Plan performed by medical provider. The above documentation has been reviewed and is accurate and complete. Helane RimaErica Jhana Giarratano, D.O.   Helane RimaErica Braydee Shimkus, DO [place], [facility] 04/18/2017

## 2017-04-22 ENCOUNTER — Encounter: Payer: Self-pay | Admitting: Family Medicine

## 2017-05-04 ENCOUNTER — Encounter: Payer: Self-pay | Admitting: Family Medicine

## 2017-05-04 ENCOUNTER — Ambulatory Visit: Payer: Managed Care, Other (non HMO) | Admitting: Family Medicine

## 2017-05-04 VITALS — BP 126/82 | HR 97 | Temp 98.6°F | Wt 261.8 lb

## 2017-05-04 DIAGNOSIS — B9689 Other specified bacterial agents as the cause of diseases classified elsewhere: Secondary | ICD-10-CM

## 2017-05-04 DIAGNOSIS — J4531 Mild persistent asthma with (acute) exacerbation: Secondary | ICD-10-CM | POA: Diagnosis not present

## 2017-05-04 DIAGNOSIS — J329 Chronic sinusitis, unspecified: Secondary | ICD-10-CM

## 2017-05-04 MED ORDER — BECLOMETHASONE DIPROP HFA 80 MCG/ACT IN AERB: 2.0000 | INHALATION_SPRAY | Freq: Two times a day (BID) | RESPIRATORY_TRACT | 3 refills | Status: DC

## 2017-05-04 MED ORDER — PREDNISONE 5 MG PO TABS: 5.0000 mg | ORAL_TABLET | Freq: Every day | ORAL | 0 refills | Status: DC

## 2017-05-04 MED ORDER — DOXYCYCLINE HYCLATE 100 MG PO TABS: 100.0000 mg | ORAL_TABLET | Freq: Two times a day (BID) | ORAL | 0 refills | Status: DC

## 2017-05-04 MED ORDER — GUAIFENESIN-CODEINE 100-10 MG/5ML PO SYRP: 10.0000 mL | ORAL_SOLUTION | Freq: Every evening | ORAL | 0 refills | Status: DC | PRN

## 2017-05-04 NOTE — Progress Notes (Signed)
Debra West is a 42 y.o. female here for an acute visit.  History of Present Illness:   Debra West, CMA acting as scribe for Dr. Helane Rima.   Cough  This is a new problem. The current episode started 1 to 4 weeks ago. The problem has been gradually worsening. The problem occurs constantly. The cough is non-productive. Associated symptoms include chest pain, ear congestion, headaches, nasal congestion, postnasal drip, a sore throat and shortness of breath. Pertinent negatives include no ear pain, fever or wheezing. Nothing aggravates the symptoms. She has tried OTC cough suppressant for the symptoms. The treatment provided no relief. Her past medical history is significant for asthma.   PMHx, SurgHx, SocialHx, Medications, and Allergies were reviewed in the Visit Navigator and updated as appropriate.  Current Medications:   Current Outpatient Medications:  .  Acetylcysteine 600 MG CAPS, Take by mouth., Disp: , Rfl:  .  Cholecalciferol 50000 units TABS, 50,000 units PO qwk for 12 weeks., Disp: 12 tablet, Rfl: 0 .  cyclobenzaprine (FLEXERIL) 5 MG tablet, Take 1 tablet (5 mg total) by mouth 2 (two) times daily as needed for muscle spasms., Disp: 20 tablet, Rfl: 0 .  Diclofenac Sodium (PENNSAID) 2 % SOLN, Place 1 application onto the skin 2 (two) times daily as needed., Disp: 8 g, Rfl: 0 .  esomeprazole (NEXIUM) 40 MG capsule, Take 30-60 min before first meal of the day, Disp: 30 capsule, Rfl: 2 .  fluconazole (DIFLUCAN) 150 MG tablet, Take 1 tablet (150 mg total) by mouth every 3 (three) days. For three doses, Disp: 3 tablet, Rfl: 0 .  FLUoxetine (PROZAC) 20 MG capsule, TAKE 1 CAPSULE BY MOUTH DAILY EVERY MORNING (MAX #30 ON INS), Disp: 90 capsule, Rfl: 0 .  hydrOXYzine (ATARAX/VISTARIL) 25 MG tablet, Take 1 tablet (25 mg total) by mouth every 8 (eight) hours as needed for itching (insomnia)., Disp: 30 tablet, Rfl: 0 .  ibuprofen (ADVIL,MOTRIN) 200 MG tablet, Take 2-3 tablets  (400-600 mg total) by mouth every 8 (eight) hours as needed for fever, headache, mild pain, moderate pain or cramping., Disp: , Rfl:  .  levothyroxine (SYNTHROID, LEVOTHROID) 50 MCG tablet, TAKE 1 TABLET BY MOUTH EVERY DAY, Disp: 90 tablet, Rfl: 2 .  metFORMIN (GLUCOPHAGE) 500 MG tablet, Take 1 tablet (500 mg total) by mouth 2 (two) times daily with a meal., Disp: 180 tablet, Rfl: 3 .  midodrine (PROAMATINE) 5 MG tablet, Take 5 mg by mouth 3 (three) times daily as needed (for low blood pressure (less than 105)). , Disp: , Rfl:  .  Norethindrone Acetate-Ethinyl Estradiol (LOESTRIN 1.5/30, 21,) 1.5-30 MG-MCG tablet, Take 1 tablet by mouth daily., Disp: 1 Package, Rfl: 11 .  nystatin ointment (MYCOSTATIN), Apply 1 application topically 2 (two) times daily., Disp: 30 g, Rfl: 0 .  pantoprazole (PROTONIX) 40 MG tablet, TAKE 1 TABLET (40 MG TOTAL) BY MOUTH DAILY. TAKE 30-60 MIN BEFORE FIRST MEAL OF THE DAY, Disp: 60 tablet, Rfl: 2 .  predniSONE (DELTASONE) 5 MG tablet, Take 1 tablet (5 mg total) by mouth daily with breakfast. 6-5-4-3-2-1-off, Disp: 21 tablet, Rfl: 0 .  triamcinolone (KENALOG) 0.025 % ointment, Apply 1 application topically 2 (two) times daily., Disp: 30 g, Rfl: 0   Allergies  Allergen Reactions  . Depo-Medrol [Methylprednisolone Acetate] Shortness Of Breath  . Aspirin Nausea And Vomiting   Review of Systems:   Pertinent items are noted in the HPI. Otherwise, ROS is negative.  Vitals:   Vitals:  05/04/17 1320  BP: 126/82  Pulse: 97  Temp: 98.6 F (37 C)  TempSrc: Oral  SpO2: 98%  Weight: 261 lb 12.8 oz (118.8 kg)     Body mass index is 41.62 kg/m.  Physical Exam:   Physical Exam  Constitutional: She appears well-nourished.  HENT:  Head: Normocephalic and atraumatic.  Right Ear: Tympanic membrane normal.  Left Ear: Tympanic membrane normal.  Nose: Rhinorrhea present.  Mouth/Throat: Posterior oropharyngeal edema present.  Eyes: EOM are normal. Pupils are equal,  round, and reactive to light.  Neck: Normal range of motion. Neck supple.  Cardiovascular: Normal rate, regular rhythm, normal heart sounds and intact distal pulses.  Pulmonary/Chest: Effort normal. No respiratory distress. She has wheezes.  Abdominal: Soft.  Skin: Skin is warm.  Psychiatric: She has a normal mood and affect. Her behavior is normal.  Nursing note and vitals reviewed.   Assessment and Plan:   1. Bacterial sinusitis - doxycycline (VIBRA-TABS) 100 MG tablet; Take 1 tablet (100 mg total) by mouth 2 (two) times daily.  Dispense: 20 tablet; Refill: 0  2. RAD (reactive airway disease) with wheezing, mild persistent, with acute exacerbation - predniSONE (DELTASONE) 5 MG tablet; Take 1 tablet (5 mg total) by mouth daily with breakfast. 6-5-4-3-2-1-off  Dispense: 21 tablet; Refill: 0 - guaiFENesin-codeine (CHERATUSSIN AC) 100-10 MG/5ML syrup; Take 10 mLs by mouth at bedtime as needed for cough or congestion.  Dispense: 120 mL; Refill: 0 - beclomethasone (QVAR REDIHALER) 80 MCG/ACT inhaler; Inhale 2 puffs into the lungs 2 (two) times daily.  Dispense: 1 Inhaler; Refill: 3   . Reviewed expectations re: course of current medical issues. . Discussed self-management of symptoms. . Outlined signs and symptoms indicating need for more acute intervention. . Patient verbalized understanding and all questions were answered. Marland Kitchen. Health Maintenance issues including appropriate healthy diet, exercise, and smoking avoidance were discussed with patient. . See orders for this visit as documented in the electronic medical record. . Patient received an After Visit Summary.  CMA served as Neurosurgeonscribe during this visit. History, Physical, and Plan performed by medical provider. The above documentation has been reviewed and is accurate and complete. Debra West, D.O.   Debra RimaErica Kikuye Korenek, DO [place], [facility] 05/04/2017

## 2017-05-25 ENCOUNTER — Encounter: Payer: Self-pay | Admitting: Family Medicine

## 2017-05-26 ENCOUNTER — Ambulatory Visit: Payer: Self-pay

## 2017-05-26 NOTE — Telephone Encounter (Signed)
Pt. Went UC 05/23/17.Pt. States is taking Prednisone po, but still has sinus pain and pressure. Green drainage with flecks of blood. Temp. This morning 101 oral. Coughing and using inhalers . Urgent care has a hold on Z-Pak at [facility].  Reason for Disposition . [1] Sinus congestion (pressure, fullness) AND [2] present > 10 days  Answer Assessment - Initial Assessment Questions 1. LOCATION: "Where does it hurt?"      Facial pain 2. ONSET: "When did the sinus pain start?"  (e.g., hours, days)      Started last week 3. SEVERITY: "How bad is the pain?"   (Scale 1-10; mild, moderate or severe)   - MILD (1-3): doesn't interfere with normal activities    - MODERATE (4-7): interferes with normal activities (e.g., work or school) or awakens from sleep   - SEVERE (8-10): excruciating pain and patient unable to do any normal activities        8 4. RECURRENT SYMPTOM: "Have you ever had sinus problems before?" If so, ask: "When was the last time?" and "What happened that time?"      Yes 5. NASAL CONGESTION: "Is the nose blocked?" If so, ask, "Can you open it or must you breathe through the mouth?"     Cab breath through nose 6. NASAL DISCHARGE: "Do you have discharge from your nose?" If so ask, "What color?"     Green with blood flecks 7. FEVER: "Do you have a fever?" If so, ask: "What is it, how was it measured, and when did it start?"      101 EARLIER today 8. OTHER SYMPTOMS: "Do you have any other symptoms?" (e.g., sore throat, cough, earache, difficulty breathing)     Cough, using inhaler as well 9. PREGNANCY: "Is there any chance you are pregnant?" "When was your last menstrual period?"     No  Protocols used: SINUS PAIN OR CONGESTION-A-AH

## 2017-05-27 MED ORDER — CEFDINIR 300 MG PO CAPS: 300.0000 mg | ORAL_CAPSULE | Freq: Two times a day (BID) | ORAL | 0 refills | Status: DC

## 2017-05-27 NOTE — Telephone Encounter (Signed)
Do you want to see patient

## 2017-05-27 NOTE — Telephone Encounter (Signed)
Please call in 20 days Omnicef 300 BID for presumed recurrent sinusitis.

## 2017-05-27 NOTE — Telephone Encounter (Signed)
Notified patient that prescription has been sent to the pharmacy.   

## 2017-06-05 ENCOUNTER — Other Ambulatory Visit: Payer: Self-pay | Admitting: Family Medicine

## 2017-06-05 NOTE — Telephone Encounter (Signed)
Please advise on refill. Looks like patient has only been seen for acute symptoms not a follow up.

## 2017-07-24 ENCOUNTER — Other Ambulatory Visit: Payer: Self-pay | Admitting: Family Medicine

## 2017-07-28 ENCOUNTER — Other Ambulatory Visit: Payer: Self-pay | Admitting: Internal Medicine

## 2017-08-12 ENCOUNTER — Other Ambulatory Visit: Payer: Self-pay | Admitting: Family Medicine

## 2017-08-12 ENCOUNTER — Encounter: Payer: Self-pay | Admitting: Family Medicine

## 2017-08-12 ENCOUNTER — Ambulatory Visit (INDEPENDENT_AMBULATORY_CARE_PROVIDER_SITE_OTHER): Payer: Managed Care, Other (non HMO)

## 2017-08-12 ENCOUNTER — Ambulatory Visit: Payer: Managed Care, Other (non HMO) | Admitting: Family Medicine

## 2017-08-12 VITALS — BP 128/84 | HR 89 | Temp 98.1°F | Ht 66.5 in | Wt 266.4 lb

## 2017-08-12 DIAGNOSIS — M25532 Pain in left wrist: Secondary | ICD-10-CM | POA: Diagnosis not present

## 2017-08-12 NOTE — Progress Notes (Signed)
Debra West is a 42 y.o. female is here for follow up.  History of Present Illness:   HPI:  Wrist Pain Patient complains of left wrist pain.  The pain is moderate, worsens with movement, and some relief by rest.  There is not associated numbness, tingling, weakness in the left hand.  Pain has been present for 2 weeks.  There is not a history of injury, strain, overuse.  There are no preventive care reminders to display for this patient. Depression screen PHQ 2/9 05/15/2016  Decreased Interest 0  Down, Depressed, Hopeless 0  PHQ - 2 Score 0   PMHx, SurgHx, SocialHx, FamHx, Medications, and Allergies were reviewed in the Visit Navigator and updated as appropriate.   Patient Active Problem List   Diagnosis Date Noted  . Obstructive sleep apnea syndrome 11/27/2016  . 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 Pernicious anemia 05/16/2016  . Morbid obesity due to excess calories (HCC) 05/16/2016  . Migraine without aura and without status migrainosus, not intractable   . Gastroesophageal reflux disease   . Hypothyroidism 01/17/2016  . Left ovarian cyst 03/16/2014  . Diaphragmatic hernia 06/14/2010  . Adjustment disorder with depressed mood 07/12/2008  . 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   Social History   Tobacco Use  . Smoking status: Never Smoker  . Smokeless tobacco: Never Used  Substance Use Topics  . Alcohol use: Yes    Comment: Occasionally  . Drug use: No   Current Medications and Allergies:   .  Acetylcysteine 600 MG CAPS, Take by mouth., Disp: , Rfl:  .  beclomethasone (QVAR REDIHALER) 80 MCG/ACT inhaler, Inhale 2 puffs into the lungs 2 (two) times daily., Disp: 1 Inhaler, Rfl: 3 .  cefdinir (OMNICEF) 300 MG capsule, Take 1 capsule (300 mg total) by mouth 2 (two) times daily., Disp: 40 capsule, Rfl: 0 .  Cholecalciferol 50000 units TABS, 50,000 units PO qwk for 12 weeks., Disp: 12 tablet, Rfl: 0 .  cyclobenzaprine (FLEXERIL) 5 MG tablet, Take 1 tablet (5 mg total)  by mouth 2 (two) times daily as needed for muscle spasms., Disp: 20 tablet, Rfl: 0 .  Diclofenac Sodium (PENNSAID) 2 % SOLN, Place 1 application onto the skin 2 (two) times daily as needed., Disp: 8 g, Rfl: 0 .  doxycycline (VIBRA-TABS) 100 MG tablet, Take 1 tablet (100 mg total) by mouth 2 (two) times daily., Disp: 20 tablet, Rfl: 0 .  esomeprazole (NEXIUM) 40 MG capsule, Take 30-60 min before first meal of the day, Disp: 30 capsule, Rfl: 2 .  FLUoxetine (PROZAC) 20 MG capsule, TAKE 1 CAPSULE BY MOUTH DAILY EVERY MORNING (MAX #30 ON INS), Disp: 90 capsule, Rfl: 0 .  guaiFENesin-codeine (CHERATUSSIN AC) 100-10 MG/5ML syrup, Take 10 mLs by mouth at bedtime as needed for cough or congestion., Disp: 120 mL, Rfl: 0 .  ibuprofen (ADVIL,MOTRIN) 200 MG tablet, Take 2-3 tablets (400-600 mg total) by mouth every 8 (eight) hours as needed for fever, headache, mild pain, moderate pain or cramping., Disp: , Rfl:  .  levothyroxine (SYNTHROID, LEVOTHROID) 50 MCG tablet, TAKE 1 TABLET BY MOUTH EVERY DAY, Disp: 90 tablet, Rfl: 0 .  metFORMIN (GLUCOPHAGE) 500 MG tablet, Take 1 tablet (500 mg total) by mouth 2 (two) times daily with a meal., Disp: 180 tablet, Rfl: 3 .  midodrine (PROAMATINE) 5 MG tablet, Take 5 mg by mouth 3 (three) times daily as needed (for low blood pressure (less than 105)). , Disp: , Rfl:  .  Norethindrone Acetate-Ethinyl  Estradiol (LOESTRIN 1.5/30, 21,) 1.5-30 MG-MCG tablet, Take 1 tablet by mouth daily., Disp: 1 Package, Rfl: 11 .  nystatin ointment (MYCOSTATIN), Apply 1 application topically 2 (two) times daily., Disp: 30 g, Rfl: 0 .  pantoprazole (PROTONIX) 40 MG tablet, TAKE 1 TABLET (40 MG TOTAL) BY MOUTH DAILY. TAKE 30-60 MIN BEFORE FIRST MEAL OF THE DAY, Disp: 30 tablet, Rfl: 0 .  predniSONE (DELTASONE) 5 MG tablet, Take 1 tablet (5 mg total) by mouth daily with breakfast. 6-5-4-3-2-1-off, Disp: 21 tablet, Rfl: 0 .  triamcinolone (KENALOG) 0.025 % ointment, Apply 1 application topically 2  (two) times daily., Disp: 30 g, Rfl: 0   Allergies  Allergen Reactions  . Depo-Medrol [Methylprednisolone Acetate] Shortness Of Breath  . Aspirin Nausea And Vomiting   Review of Systems   Pertinent items are noted in the HPI. Otherwise, ROS is negative.  Vitals:   Vitals:   08/12/17 1119  BP: 128/84  Pulse: 89  Temp: 98.1 F (36.7 C)  TempSrc: Oral  SpO2: 98%  Weight: 266 lb 6.4 oz (120.8 kg)  Height: 5' 6.5" (1.689 m)     Body mass index is 42.35 kg/m. Physical Exam:   Physical Exam  Constitutional: She appears well-nourished.  HENT:  Head: Normocephalic and atraumatic.  Eyes: Pupils are equal, round, and reactive to light. EOM are normal.  Neck: Normal range of motion. Neck supple.  Cardiovascular: Normal rate, regular rhythm, normal heart sounds and intact distal pulses.  Pulmonary/Chest: Effort normal.  Abdominal: Soft.  Musculoskeletal:       Left wrist: She exhibits decreased range of motion and bony tenderness.  TTP distal ulna. Restricted motion due to pain.  Skin: Skin is warm.  Psychiatric: She has a normal mood and affect. Her behavior is normal.  Nursing note and vitals reviewed.  Assessment and Plan:   Debra West was seen today for wrist pain.  Diagnoses and all orders for this visit:  Left wrist pain Comments: Possible TFCC. Splint. RICE. Follow up with SM. Orders: -     DG Wrist 2 Views Left; Future -     Ambulatory referral to Sports Medicine  . Reviewed expectations re: course of current medical issues. . Discussed self-management of symptoms. . Outlined signs and symptoms indicating need for more acute intervention. . Patient verbalized understanding and all questions were answered. Marland Kitchen Health Maintenance issues including appropriate healthy diet, exercise, and smoking avoidance were discussed with patient. . See orders for this visit as documented in the electronic medical record. . Patient received an After Visit Summary.  Helane Rima,  DO , [facility] [place] 08/12/2017  No future appointments.

## 2017-08-17 ENCOUNTER — Encounter: Payer: Self-pay | Admitting: Sports Medicine

## 2017-08-17 ENCOUNTER — Ambulatory Visit: Payer: Self-pay

## 2017-08-17 ENCOUNTER — Ambulatory Visit: Payer: Managed Care, Other (non HMO) | Admitting: Sports Medicine

## 2017-08-17 VITALS — BP 138/98 | HR 95 | Ht 66.5 in | Wt 265.2 lb

## 2017-08-17 DIAGNOSIS — M25532 Pain in left wrist: Secondary | ICD-10-CM

## 2017-08-17 NOTE — Patient Instructions (Signed)

## 2017-08-17 NOTE — Progress Notes (Signed)
PROCEDURE NOTE:  Ultrasound Guided: Injection: Left wrist Images were obtained and interpreted by myself, Gaspar Bidding, DO  Images have been saved and stored to PACS system. Images obtained on: GE S7 Ultrasound machine    ULTRASOUND FINDINGS:  + small joint effusion ?TFCC tear Tenosynovitis of 5th dorsal compartment  DESCRIPTION OF PROCEDURE:  The patient's clinical condition is marked by substantial pain and/or significant functional disability. Other conservative therapy has not provided relief, is contraindicated, or not appropriate. There is a reasonable likelihood that injection will significantly improve the patient's pain and/or functional impairment.   After discussing the risks, benefits and expected outcomes of the injection and all questions were reviewed and answered, the patient wished to undergo the above named procedure.  Verbal consent was obtained.  The ultrasound was used to identify the target structure and adjacent neurovascular structures. The skin was then prepped in sterile fashion and the target structure was injected under direct visualization using sterile technique as below:  PREP: Alcohol and Ethel Chloride APPROACH: ulnar sided, single injection, 25g 1.5 in. INJECTATE: 1 cc 0.5% Marcaine and 1 cc 40 mg/mL Kenalog ASPIRATE: None DRESSING: Band-Aid  Post procedural instructions including recommending icing and warning signs for infection were reviewed.    This procedure was well tolerated and there were no complications.   IMPRESSION: Succesful Ultrasound Guided: Injection

## 2017-08-17 NOTE — Progress Notes (Signed)
  Debra West. Debra West Sports [facility] at [facility] [phone]  Debra West - 42 y.o. female MRN [id number]  Date of birth: [birth year]  Visit Date: 08/17/2017  PCP: Helane Rima, DO   Referred by: Helane Rima, DO  Scribe for today's visit: Christoper Fabian, LAT, ATC     SUBJECTIVE:  Debra West is here for New Patient (Initial Visit) (L wrist pain) .  Referred by: Dr. Earlene Plater Her L ulnar-sided wrist pain symptoms INITIALLY: Began about 3 weeks ago w/ no MOI. Described as moderate sharp pain, radiating to L hand and forearm Worsened with movement and picking up objects Improved with rest, immobilization and ice Additional associated symptoms include: swelling and popping/clicking in the L ulnar wrist    At this time symptoms are worsening compared to onset w/ increased frequency and intensity of pain. She has been wearing an OTC wrist brace.  She has been taking IBU prn.  L wrist X-ray on 08/12/17  ROS Reports night time disturbances. Denies fevers, chills, or night sweats. Denies unexplained weight loss. Denies personal history of cancer. Denies changes in bowel or bladder habits. Denies recent unreported falls. Denies new or worsening dyspnea or wheezing. Reports headaches or dizziness.  Denies numbness, tingling or weakness  In the extremities.  Denies dizziness or presyncopal episodes Denies lower extremity edema    HISTORY & PERTINENT PRIOR DATA:  Prior History reviewed and updated per electronic medical record.  Significant/pertinent history, findings, studies include:  reports that she has never smoked. She has never used smokeless tobacco. Recent Labs    11/27/16 0809 04/15/17 1518  HGBA1C 6.4 6.0   No specialty comments available. No problems updated.  OBJECTIVE:  VS:  HT:5' 6.5" (168.9 cm)   WT:265 lb 3.2 oz (120.3 kg)  BMI:42.17    BP:(Abnormal) 138/98  HR:95bpm  TEMP: ( )  RESP:95 %   PHYSICAL  EXAM: Constitutional: WDWN, Non-toxic appearing. Psychiatric: Alert & appropriately interactive.  Not depressed or anxious appearing. Respiratory: No increased work of breathing.  Trachea Midline Eyes: Pupils are equal.  EOM intact without nystagmus.  No scleral icterus  Vascular Exam: warm to touch no edema  upper extremity neuro exam: unremarkable  MSK Exam: Left wrist with pain directly over the dorsal aspect DRUJ worse along the ulnar aspect.  Mid carpal row is tender to palpation.  Grip strength is intact.  Pain is worse with wrist extension.   ASSESSMENT & PLAN:   1. Left wrist pain     PLAN: Injection of the wrist today.  Compression sleeve discussed.  Follow-up: Return in about 1 month (around 09/14/2017).      Please see additional documentation for Objective, Assessment and Plan sections. Pertinent additional documentation may be included in corresponding procedure notes, imaging studies, problem based documentation and patient instructions. Please see these sections of the encounter for additional information regarding this visit.  CMA/ATC served as Neurosurgeon during this visit. History, Physical, and Plan performed by medical provider. Documentation and orders reviewed and attested to.      Andrena Mews, DO    [place] [facility] Physician

## 2017-08-22 ENCOUNTER — Encounter: Payer: Self-pay | Admitting: Sports Medicine

## 2017-08-29 ENCOUNTER — Other Ambulatory Visit: Payer: Self-pay | Admitting: Family Medicine

## 2017-08-30 ENCOUNTER — Other Ambulatory Visit: Payer: Self-pay | Admitting: Internal Medicine

## 2017-09-22 ENCOUNTER — Ambulatory Visit: Payer: Managed Care, Other (non HMO) | Admitting: Sports Medicine

## 2017-09-22 ENCOUNTER — Encounter: Payer: Self-pay | Admitting: Sports Medicine

## 2017-09-22 VITALS — BP 124/86 | HR 93 | Ht 66.5 in | Wt 268.4 lb

## 2017-09-22 DIAGNOSIS — G2589 Other specified extrapyramidal and movement disorders: Secondary | ICD-10-CM

## 2017-09-22 DIAGNOSIS — M25532 Pain in left wrist: Secondary | ICD-10-CM | POA: Diagnosis not present

## 2017-09-22 DIAGNOSIS — M75102 Unspecified rotator cuff tear or rupture of left shoulder, not specified as traumatic: Secondary | ICD-10-CM

## 2017-09-22 NOTE — Progress Notes (Signed)
Debra West. Debra West Sports Medicine [facility] at [facility] [phone]  Debra West - 42 y.o. female MRN [id number]  Date of birth: Mar 25, 1976  Visit Date: 09/22/2017  PCP: Helane Rima, DO   Referred by: Helane Rima, DO  Scribe(s) for today's visit: Stevenson Clinch, CMA  SUBJECTIVE:  Debra West is here for Follow-up (L wrist pain)   08/17/2017: Her L ulnar-sided wrist pain symptoms INITIALLY: Began about 3 weeks ago w/ no MOI. Described as moderate sharp pain, radiating to L hand and forearm Worsened with movement and picking up objects Improved with rest, immobilization and ice Additional associated symptoms include: swelling and popping/clicking in the L ulnar wrist   At this time symptoms are worsening compared to onset w/ increased frequency and intensity of pain. She has been wearing an OTC wrist brace.  She has been taking IBU prn. L wrist X-ray on 08/12/17  09/22/2017: Compared to the last office visit, her previously described symptoms are improving. The pain increased the first week after the injection but then began to improved. She denies drainage, increased warmth or redness at the injection site. She did notice increased swelling and stiffness after the injection.  Current symptoms are mild & are nonradiating. She still has intermittent mild pain when grabbing thing at times but it does not linger like it did before.  She has not been wearing wrist brace or taking IBU.    REVIEW OF SYSTEMS: Denies night time disturbances. Denies fevers, chills, or night sweats. Denies unexplained weight loss. Denies personal history of cancer. Denies changes in bowel or bladder habits. Denies recent unreported falls. Denies new or worsening dyspnea or wheezing. Reports headaches.  Denies numbness, tingling or weakness  In the extremities.  Denies dizziness or presyncopal episodes Denies lower extremity edema    HISTORY & PERTINENT PRIOR  DATA:  Prior History reviewed and updated per electronic medical record.  Significant/pertinent history, findings, studies include:  reports that she has never smoked. She has never used smokeless tobacco. Recent Labs    11/27/16 0809 04/15/17 1518  HGBA1C 6.4 6.0   No specialty comments available. No problems updated.  OBJECTIVE:  VS:  HT:5' 6.5" (168.9 cm)   WT:268 lb 6.4 oz (121.7 kg)  BMI:42.68    BP:124/86  HR:93bpm  TEMP: ( )  RESP:97 %   PHYSICAL EXAM: Constitutional: WDWN, Non-toxic appearing. Psychiatric: Alert & appropriately interactive.  Not depressed or anxious appearing. Respiratory: No increased work of breathing.  Trachea Midline Eyes: Pupils are equal.  EOM intact without nystagmus.  No scleral icterus  Vascular Exam: warm to touch no edema  upper extremity neuro exam: unremarkable normal strength normal sensation  MSK Exam: Left wrist: Overall well aligned.  Minimal Crepitation with palpation and passive range of motion.  Grip strength and active ROM normal and intact.  She has a small amount of pain with ulnar deviation and axial load but this is minimal.  Left shoulder held in moderate amount protraction with full overhead range of motion.  Intrinsic rotator cuff strength intact.  No significant pain or crepitation with axial loading circumduction moderate pec trigger points and tender points.  Scapular dyskinesis with overhead range of motion.   ASSESSMENT & PLAN:   1. Left wrist pain   2. Scapular dyskinesis   3. Rotator cuff syndrome of left shoulder     PLAN: Discussed the foundation of treatment for this condition is physical therapy and/or daily (5-6 days/week) therapeutic exercises, focusing  on core strengthening, coordination, neuromuscular control/reeducation.  Therapeutic exercises prescribed per procedure note.  Overall she is doing well following the injection.  I suspect she has underlying TFCC tear and if any lack of improvement or  worsening symptoms further diagnostic evaluation with MR arthrogram could be considered.    Follow-up: Return if symptoms worsen or fail to improve.      Please see additional documentation for Objective, Assessment and Plan sections. Pertinent additional documentation may be included in corresponding procedure notes, imaging studies, problem based documentation and patient instructions. Please see these sections of the encounter for additional information regarding this visit.  CMA/ATC served as Neurosurgeonscribe during this visit. History, Physical, and Plan performed by medical provider. Documentation and orders reviewed and attested to.      Andrena MewsMichael D Rigby, DO     Sports Medicine Physician

## 2017-09-22 NOTE — Patient Instructions (Signed)
Please perform the exercise program that we have prepared for you and gone over in detail on a daily basis.  In addition to the handout you were provided you can access your program through: [email or web address]   Your unique program code is: [email or web address]

## 2017-09-22 NOTE — Progress Notes (Signed)
PROCEDURE NOTE: THERAPEUTIC EXERCISES (97110) 15 minutes spent for Therapeutic exercises as below and as referenced in the AVS.  This included exercises focusing on stretching, strengthening, with significant focus on eccentric aspects.   Proper technique shown and discussed handout in great detail with ATC.  All questions were discussed and answered.   Long term goals include an improvement in range of motion, strength, endurance as well as avoiding reinjury. Frequency of visits is one time as determined during today's  office visit. Frequency of exercises to be performed is as per handout.  EXERCISES REVIEWED: Intrinsic Rotator Cuff Exercises Scapular Stabilization pec stretching

## 2017-10-04 ENCOUNTER — Ambulatory Visit (HOSPITAL_BASED_OUTPATIENT_CLINIC_OR_DEPARTMENT_OTHER)
Admission: RE | Admit: 2017-10-04 | Discharge: 2017-10-04 | Disposition: A | Discharge status: Home / Self Care | Payer: Managed Care, Other (non HMO) | Source: Ambulatory Visit | Attending: Obstetrics & Gynecology | Admitting: Obstetrics & Gynecology

## 2017-10-04 DIAGNOSIS — N83202 Unspecified ovarian cyst, left side: Secondary | ICD-10-CM | POA: Diagnosis not present

## 2017-10-04 DIAGNOSIS — N83209 Unspecified ovarian cyst, unspecified side: Secondary | ICD-10-CM

## 2017-10-20 IMAGING — CT CT ABD-PELV W/ CM
2 of 5 series · 16 of 46 positions shown, 18 images · IV contrast (APPLIED)
Comparison: None.

CLINICAL DATA: Lower abdominal pain beginning today with headache,
nausea, vomiting common diarrhea. History of cholecystectomy and
ovarian cyst removal.

EXAM:
CT ABDOMEN AND PELVIS WITH CONTRAST
TECHNIQUE: Multidetector CT imaging of the abdomen and pelvis was performed
using the standard protocol following bolus administration of
intravenous contrast.
CONTRAST:  100mL WYGHFW-7VV IOPAMIDOL (WYGHFW-7VV) INJECTION 61%

[Series 2: axial st · axial · 0.98mm/px · z∈[-482,-16]mm · 13 of 105 slices shown, 15 images]
[im 6/105  soft-tissue]
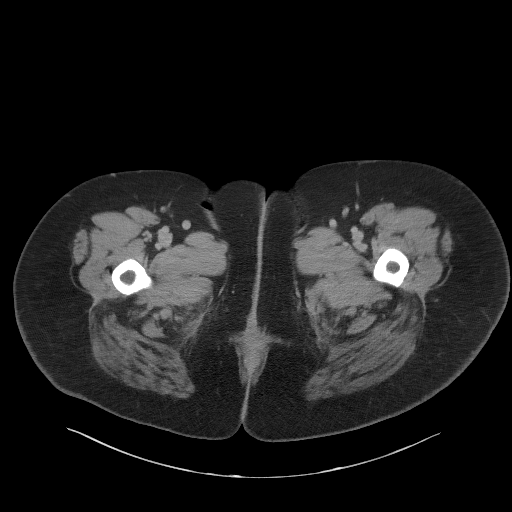
[im 6/105  bone]
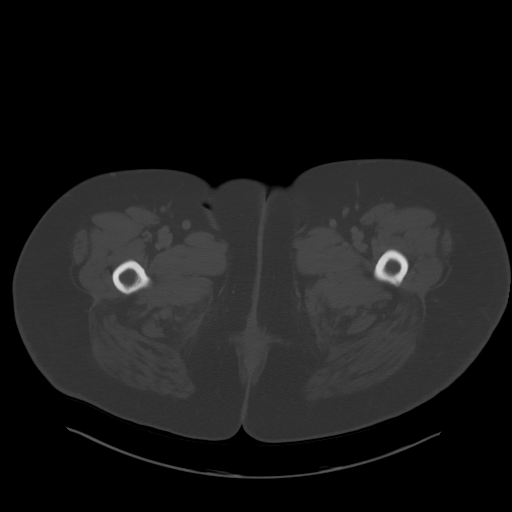
[im 12/105  soft-tissue]
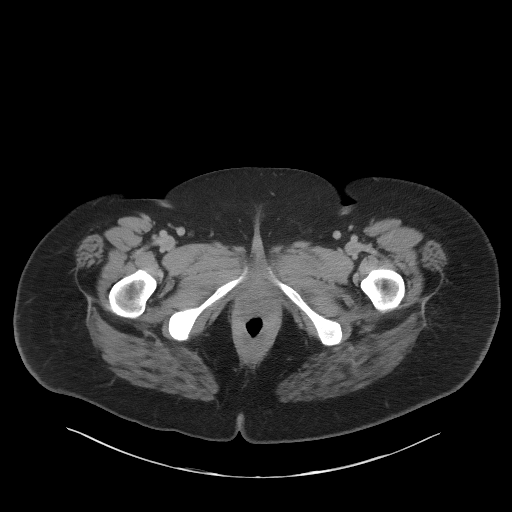
[im 24/105  soft-tissue]
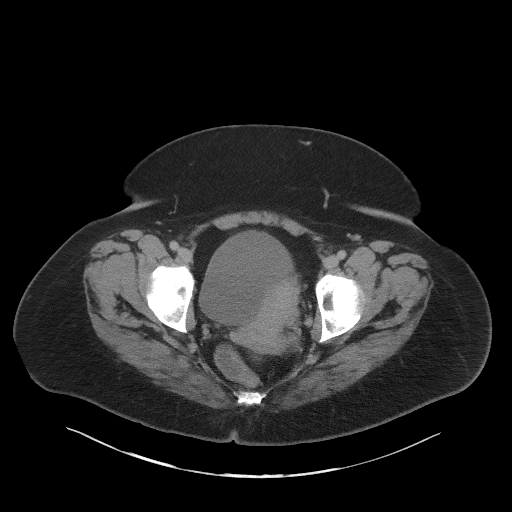
[im 29/105  soft-tissue]
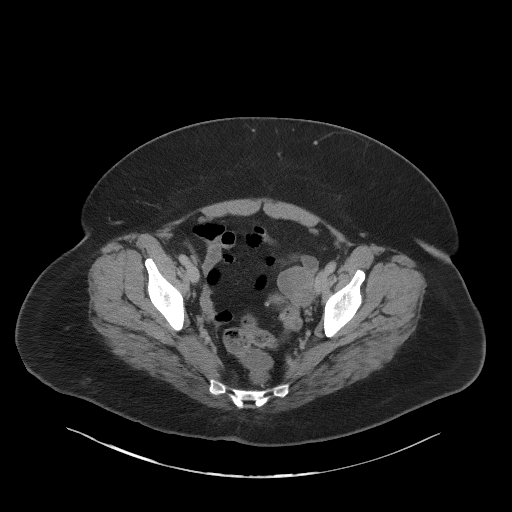
[im 35/105  soft-tissue]
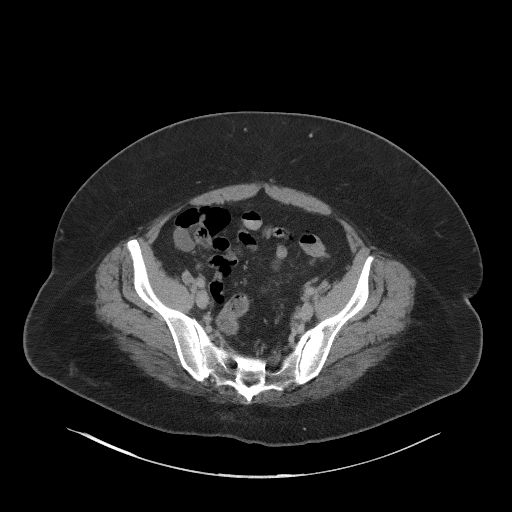
[im 47/105  soft-tissue]
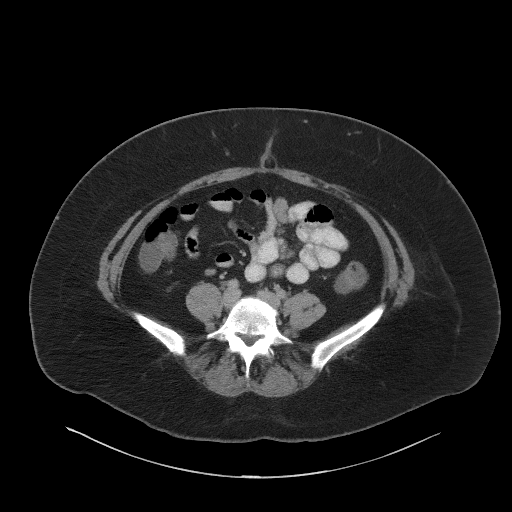
[im 53/105  soft-tissue]
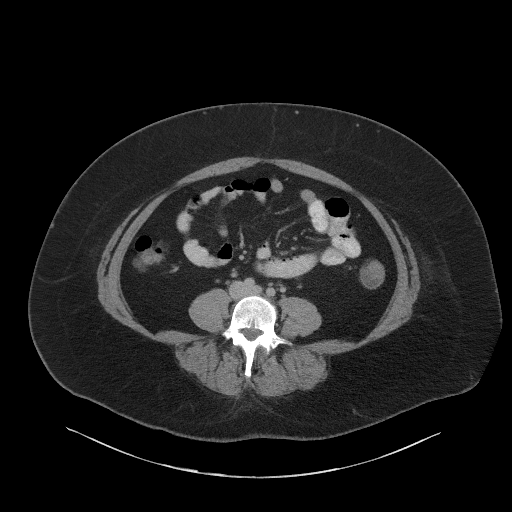
[im 58/105  soft-tissue]
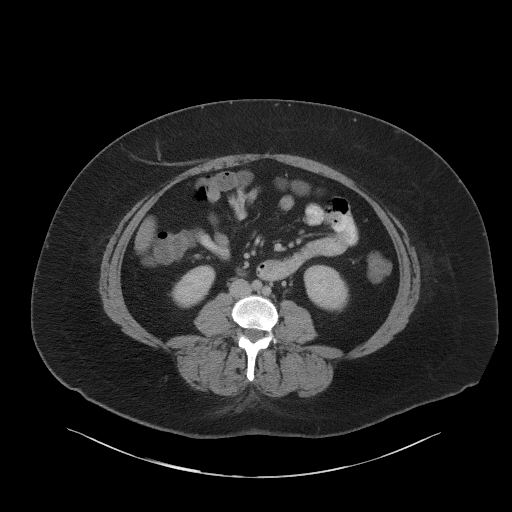
[im 70/105  soft-tissue]
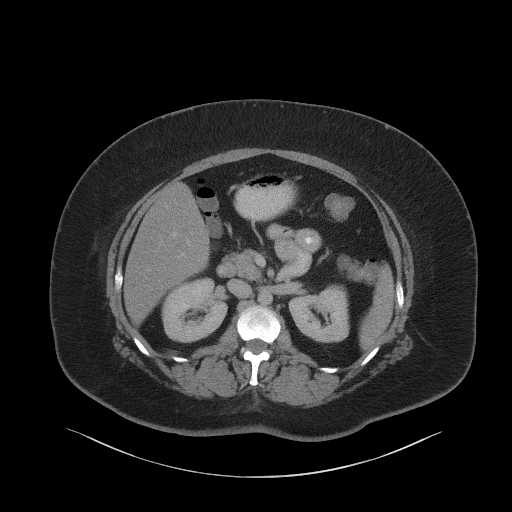
[im 70/105  bone]
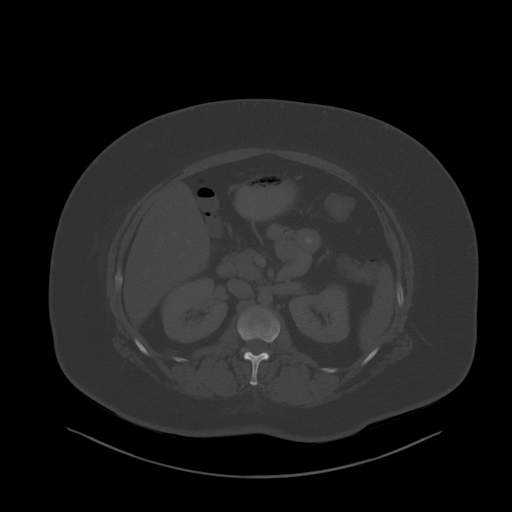
[im 76/105  soft-tissue]
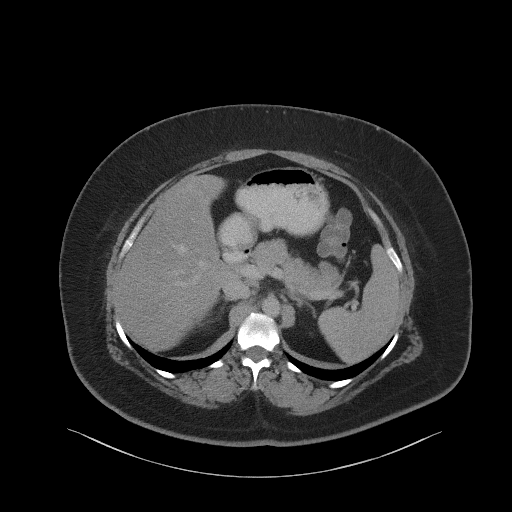
[im 81/105  soft-tissue]
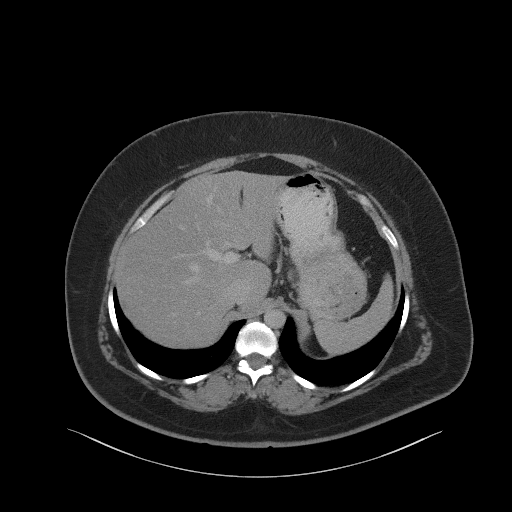
[im 93/105  soft-tissue]
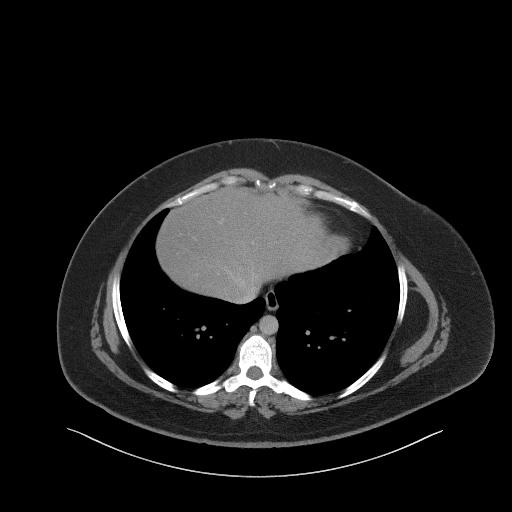
[im 99/105  soft-tissue]
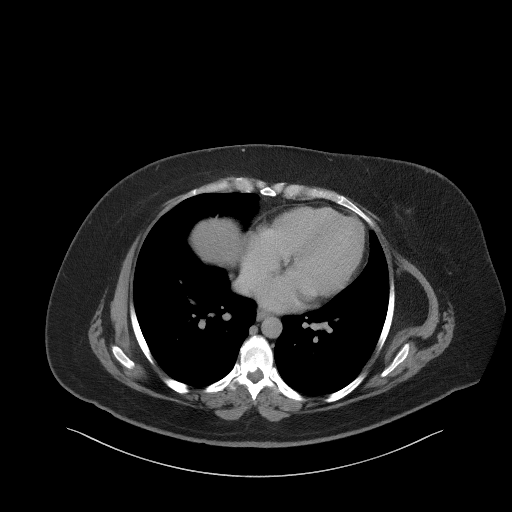

[Series 5: coronal st · coronal · 0.97mm/px · 3 of 99 slices shown]
[im 33/99  soft-tissue]
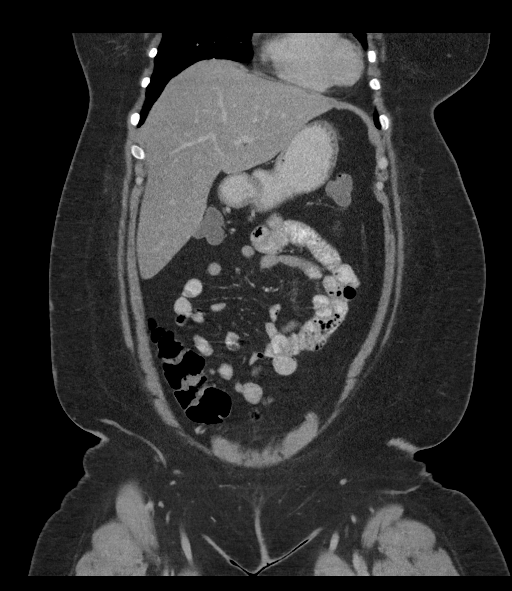
[im 44/99  soft-tissue]
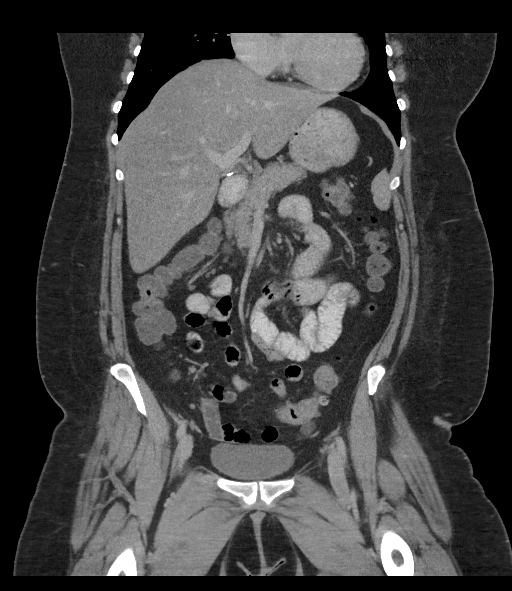
[im 55/99  soft-tissue]
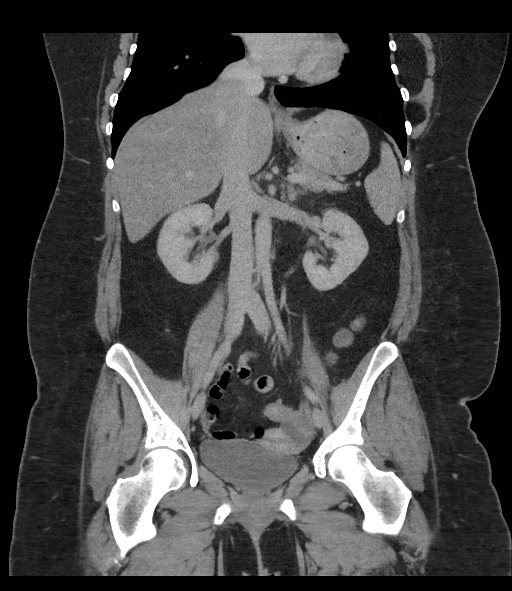

[16 of 46 positions shown; findings below may reference images not displayed]

FINDINGS: Lower chest: Clear lung bases.

Hepatobiliary: Diffusely decreased attenuation of the liver
consistent with steatosis. No focal liver abnormality identified.
Prior cholecystectomy. No biliary dilatation.

Pancreas: Unremarkable.

Spleen: Unremarkable.

Adrenals/Urinary Tract: Unremarkable adrenal glands. No evidence of
renal mass, calculi, or hydronephrosis. No ureteral calculi or
ureteral dilatation. Unremarkable bladder.

Stomach/Bowel: The stomach and is within normal limits. Oral
contrast is present in multiple nondilated loops of small bowel
without evidence of obstruction. There is left-sided colonic
diverticulosis without evidence of diverticulitis. The appendix is
not clearly identified, however no inflammatory changes are seen in
the right lower quadrant.

Vascular/Lymphatic: No significant vascular findings are present. No
enlarged abdominal or pelvic lymph nodes.

Reproductive: Uterus and right ovary are unremarkable. The left
ovary is asymmetrically enlarged with multiple hypoattenuating foci
suggestive of cysts measuring up to 3.3 cm.

Other: No intraperitoneal free fluid. No abdominal wall mass or
hernia.

Musculoskeletal: Mild thoracolumbar spondylosis.
IMPRESSION: 1. No definite acute abnormality identified in the abdomen or
pelvis.
2. Enlarged left ovary likely containing multiple cysts. Pelvic
ultrasound could provide further characterization if there is
clinical concern for a gynecologic cause of the patient's acute
pain.
3. Hepatic steatosis.

## 2017-10-26 ENCOUNTER — Encounter: Payer: Self-pay | Admitting: Family Medicine

## 2017-10-26 ENCOUNTER — Ambulatory Visit: Payer: Managed Care, Other (non HMO) | Admitting: Family Medicine

## 2017-10-26 VITALS — BP 126/84 | HR 97 | Temp 98.6°F | Ht 66.5 in | Wt 273.0 lb

## 2017-10-26 DIAGNOSIS — R5381 Other malaise: Secondary | ICD-10-CM

## 2017-10-26 DIAGNOSIS — D510 Vitamin B12 deficiency anemia due to intrinsic factor deficiency: Secondary | ICD-10-CM | POA: Diagnosis not present

## 2017-10-26 DIAGNOSIS — R5383 Other fatigue: Secondary | ICD-10-CM | POA: Diagnosis not present

## 2017-10-26 DIAGNOSIS — E039 Hypothyroidism, unspecified: Secondary | ICD-10-CM | POA: Diagnosis not present

## 2017-10-26 LAB — IRON,TIBC AND FERRITIN PANEL
%SAT: 12 % (calc) — ABNORMAL LOW (ref 16–45)
Ferritin: 44 ng/mL (ref 16–232)
Iron: 52 ug/dL (ref 40–190)
TIBC: 423 mcg/dL (calc) (ref 250–450)

## 2017-10-26 MED ORDER — CYANOCOBALAMIN 1000 MCG/ML IJ SOLN
1000.0000 ug | Freq: Once | INTRAMUSCULAR | Status: AC
  Administered 2017-10-26: 1000 ug via INTRAMUSCULAR

## 2017-10-26 NOTE — Progress Notes (Signed)
Debra West is a 42 y.o. female here for an acute visit.  History of Present Illness:   HPI:   Fatigue Patient complains of fatigue. Symptoms began several days ago. Symptoms of her fatigue have been diffuse soft tissue aches and pains, feelings of depression, general malaise and hypersomnolence. Patient describes the following psychological symptoms: stress at baseline. Patient denies excessive menstrual bleeding, fever, GI blood loss, significant change in weight and unusual rashes. Symptoms have progressed to a point and plateaued. Symptom severity: struggles to carry out day to day responsibilities.. Previous visits for this problem: multiple, this is a longstanding diagnosis.   PMHx, SurgHx, SocialHx, Medications, and Allergies were reviewed in the Visit Navigator and updated as appropriate.  Current Medications:   .  Acetylcysteine 600 MG CAPS, Take by mouth., Disp: , Rfl:  .  beclomethasone (QVAR REDIHALER) 80 MCG/ACT inhaler, Inhale 2 puffs into the lungs 2 (two) times daily., Disp: 1 Inhaler, Rfl: 3 .  Cholecalciferol 50000 units TABS, 50,000 units PO qwk for 12 weeks., Disp: 12 tablet, Rfl: 0 .  cyclobenzaprine (FLEXERIL) 5 MG tablet, Take 1 tablet (5 mg total) by mouth 2 (two) times daily as needed for muscle spasms., Disp: 20 tablet, Rfl: 0 .  Diclofenac Sodium (PENNSAID) 2 % SOLN, Place 1 application onto the skin 2 (two) times daily as needed., Disp: 8 g, Rfl: 0 .  FLUoxetine (PROZAC) 20 MG capsule, TAKE 1 CAPSULE BY MOUTH DAILY EVERY MORNING (MAX #30 ON INS), Disp: 90 capsule, Rfl: 0 .  ibuprofen (ADVIL,MOTRIN) 200 MG tablet, Take 2-3 tablets (400-600 mg total) by mouth every 8 (eight) hours as needed for fever, headache, mild pain, moderate pain or cramping., Disp: , Rfl:  .  levothyroxine (SYNTHROID, LEVOTHROID) 50 MCG tablet, TAKE 1 TABLET BY MOUTH EVERY DAY, Disp: 90 tablet, Rfl: 0 .  metFORMIN (GLUCOPHAGE) 500 MG tablet, Take 1 tablet (500 mg total) by mouth 2 (two)  times daily with a meal. (Patient not taking: Reported on 09/22/2017), Disp: 180 tablet, Rfl: 3 .  midodrine (PROAMATINE) 5 MG tablet, Take 5 mg by mouth 3 (three) times daily as needed (for low blood pressure (less than 105)). , Disp: , Rfl:  .  Norethindrone Acetate-Ethinyl Estradiol (LOESTRIN 1.5/30, 21,) 1.5-30 MG-MCG tablet, Take 1 tablet by mouth daily., Disp: 1 Package, Rfl: 11 .  pantoprazole (PROTONIX) 40 MG tablet, TAKE 1 TABLET (40 MG TOTAL) BY MOUTH DAILY. TAKE 30-60 MIN BEFORE FIRST MEAL OF THE DAY, Disp: 30 tablet, Rfl: 0 .  triamcinolone (KENALOG) 0.025 % ointment, Apply 1 application topically 2 (two) times daily., Disp: 30 g, Rfl: 0   Allergies  Allergen Reactions  . Depo-Medrol [Methylprednisolone Acetate] Shortness Of Breath  . Aspirin Nausea And Vomiting   Review of Systems:   Pertinent items are noted in the HPI. Otherwise, ROS is negative.  Vitals:   Vitals:   10/26/17 1507  BP: 126/84  Pulse: 97  Temp: 98.6 F (37 C)  TempSrc: Oral  SpO2: 97%  Weight: 273 lb (123.8 kg)  Height: 5' 6.5" (1.689 m)     Body mass index is 43.4 kg/m.  Physical Exam:   Physical Exam  Constitutional: She is oriented to person, place, and time. She appears well-developed and well-nourished. No distress.  HENT:  Head: Normocephalic and atraumatic.  Right Ear: External ear normal.  Left Ear: External ear normal.  Nose: Nose normal.  Mouth/Throat: Oropharynx is clear and moist.  Eyes: Pupils are equal, round, and reactive  to light. Conjunctivae and EOM are normal.  Neck: Normal range of motion. Neck supple. No thyromegaly present.  Cardiovascular: Normal rate, regular rhythm and intact distal pulses.  Pulmonary/Chest: Effort normal and breath sounds normal.  Abdominal: Soft. Bowel sounds are normal.  Musculoskeletal: Normal range of motion.  Lymphadenopathy:    She has no cervical adenopathy.  Neurological: She is alert and oriented to person, place, and time.  Skin: Skin  is warm and dry. Capillary refill takes less than 2 seconds.  Psychiatric: She has a normal mood and affect. Her behavior is normal.  Nursing note and vitals reviewed.  Assessment and Plan:   Debra West was seen today for fatigue.  Diagnoses and all orders for this visit:  Malaise and fatigue -     CBC with Differential/Platelet -     Comprehensive metabolic panel -     Vitamin B12 -     Iron, TIBC and Ferritin Panel -     Urinalysis, Routine w reflex microscopic  Hypothyroidism, unspecified type -     TSH -     T4, free  Pernicious anemia -     cyanocobalamin ((VITAMIN B-12)) injection 1,000 mcg    . Reviewed expectations re: course of current medical issues. . Discussed self-management of symptoms. . Outlined signs and symptoms indicating need for more acute intervention. . Patient verbalized understanding and all questions were answered. Marland Kitchen Health Maintenance issues including appropriate healthy diet, exercise, and smoking avoidance were discussed with patient. . See orders for this visit as documented in the electronic medical record. . Patient received an After Visit Summary.   Helane Rima, DO Debra West, Debra [facility] 10/28/2017

## 2017-10-27 LAB — URINALYSIS, ROUTINE W REFLEX MICROSCOPIC
Bilirubin Urine: NEGATIVE
Hgb urine dipstick: NEGATIVE
Ketones, ur: NEGATIVE
Leukocytes, UA: NEGATIVE
Nitrite: NEGATIVE
RBC / HPF: NONE SEEN (ref 0–?)
Specific Gravity, Urine: <=1.005 — AB (ref 1.000–1.030)
Total Protein, Urine: NEGATIVE
Urine Glucose: NEGATIVE
Urobilinogen, UA: 0.2 (ref 0.0–1.0)
WBC, UA: NONE SEEN (ref 0–?)
pH: 6.5 (ref 5.0–8.0)

## 2017-10-27 LAB — CBC WITH DIFFERENTIAL/PLATELET
Basophils Absolute: 0.2 10*3/uL — ABNORMAL HIGH (ref 0.0–0.1)
Basophils Relative: 1.2 % (ref 0.0–3.0)
Eosinophils Absolute: 0.3 10*3/uL (ref 0.0–0.7)
Eosinophils Relative: 2.6 % (ref 0.0–5.0)
HCT: 40.3 % (ref 36.0–46.0)
Hemoglobin: 13.2 g/dL (ref 12.0–15.0)
Lymphocytes Relative: 30.1 % (ref 12.0–46.0)
Lymphs Abs: 3.8 10*3/uL (ref 0.7–4.0)
MCHC: 32.7 g/dL (ref 30.0–36.0)
MCV: 84 fl (ref 78.0–100.0)
Monocytes Absolute: 0.7 10*3/uL (ref 0.1–1.0)
Monocytes Relative: 5.4 % (ref 3.0–12.0)
Neutro Abs: 7.6 10*3/uL (ref 1.4–7.7)
Neutrophils Relative %: 60.7 % (ref 43.0–77.0)
Platelets: 371 10*3/uL (ref 150.0–400.0)
RBC: 4.8 Mil/uL (ref 3.87–5.11)
RDW: 14.6 % (ref 11.5–15.5)
WBC: 12.5 10*3/uL — ABNORMAL HIGH (ref 4.0–10.5)

## 2017-10-27 LAB — COMPREHENSIVE METABOLIC PANEL
ALT: 33 U/L (ref 0–35)
AST: 34 U/L (ref 0–37)
Albumin: 4.2 g/dL (ref 3.5–5.2)
Alkaline Phosphatase: 46 U/L (ref 39–117)
BUN: 6 mg/dL (ref 6–23)
CO2: 28 mEq/L (ref 19–32)
Calcium: 9.5 mg/dL (ref 8.4–10.5)
Chloride: 97 mEq/L (ref 96–112)
Creatinine, Ser: 0.59 mg/dL (ref 0.40–1.20)
GFR: 118.14 mL/min (ref >60.00)
Glucose, Bld: 84 mg/dL (ref 70–99)
Potassium: 4.1 mEq/L (ref 3.5–5.1)
Sodium: 136 mEq/L (ref 135–145)
Total Bilirubin: 0.4 mg/dL (ref 0.2–1.2)
Total Protein: 7.2 g/dL (ref 6.0–8.3)

## 2017-10-27 LAB — VITAMIN B12: Vitamin B-12: >1500 pg/mL — ABNORMAL HIGH (ref 211–911)

## 2017-10-27 LAB — TSH: TSH: 3.24 u[IU]/mL (ref 0.35–4.50)

## 2017-10-27 LAB — T4, FREE: Free T4: 0.66 ng/dL (ref 0.60–1.60)

## 2017-10-28 ENCOUNTER — Encounter: Payer: Self-pay | Admitting: Family Medicine

## 2017-11-14 ENCOUNTER — Other Ambulatory Visit: Payer: Self-pay | Admitting: Family Medicine

## 2018-02-08 ENCOUNTER — Encounter: Payer: Self-pay | Admitting: Family Medicine

## 2018-02-08 ENCOUNTER — Ambulatory Visit: Payer: Self-pay

## 2018-02-08 ENCOUNTER — Ambulatory Visit: Payer: Managed Care, Other (non HMO) | Admitting: Sports Medicine

## 2018-02-08 ENCOUNTER — Other Ambulatory Visit: Payer: Self-pay

## 2018-02-08 ENCOUNTER — Encounter: Payer: Self-pay | Admitting: Sports Medicine

## 2018-02-08 VITALS — BP 130/88 | HR 102 | Ht 66.5 in | Wt 272.4 lb

## 2018-02-08 DIAGNOSIS — M25531 Pain in right wrist: Secondary | ICD-10-CM | POA: Diagnosis not present

## 2018-02-08 DIAGNOSIS — M654 Radial styloid tenosynovitis [de Quervain]: Secondary | ICD-10-CM | POA: Diagnosis not present

## 2018-02-08 DIAGNOSIS — N83202 Unspecified ovarian cyst, left side: Secondary | ICD-10-CM

## 2018-02-08 MED ORDER — NORETHINDRONE ACET-ETHINYL EST 1.5-30 MG-MCG PO TABS: 1.0000 | ORAL_TABLET | Freq: Every day | ORAL | 11 refills | Status: DC

## 2018-02-08 MED ORDER — LEVOTHYROXINE SODIUM 50 MCG PO TABS: 50.0000 ug | ORAL_TABLET | Freq: Every day | ORAL | 2 refills | Status: DC

## 2018-02-08 MED ORDER — FLUOXETINE HCL 20 MG PO CAPS: ORAL_CAPSULE | ORAL | 0 refills | Status: DC

## 2018-02-08 NOTE — Procedures (Signed)
PROCEDURE NOTE:  Ultrasound Guided: Injection: Left 1st dorsal compartment tendon sheath Images were obtained and interpreted by myself, Gaspar Bidding, DO  Images have been saved and stored to PACS system. Images obtained on: GE S7 Ultrasound machine    ULTRASOUND FINDINGS:  Poor tendon glide with a small amount of peritendinous swelling and minimal findings of tenosynovitis but once again poor tendon glide and focally tender directly over this region.  DESCRIPTION OF PROCEDURE:  The patient's clinical condition is marked by substantial pain and/or significant functional disability. Other conservative therapy has not provided relief, is contraindicated, or not appropriate. There is a reasonable likelihood that injection will significantly improve the patient's pain and/or functional impairment.   After discussing the risks, benefits and expected outcomes of the injection and all questions were reviewed and answered, the patient wished to undergo the above named procedure.  Verbal consent was obtained.  The ultrasound was used to identify the target structure and adjacent neurovascular structures. The skin was then prepped in sterile fashion and the target structure was injected under direct visualization using sterile technique as below:  Single injection performed as below: PREP: Alcohol, Ethel Chloride and 1 cc 1% lidocaine on Insulin Needle APPROACH:radial sided, single injection, 25g 1.5 in. INJECTATE: 0.5 cc 1% lidocaine, 0.5 cc 0.5% Marcaine and 0.5 cc 40mg /mL DepoMedrol ASPIRATE: None DRESSING: Band-Aid  Post procedural instructions including recommending icing and warning signs for infection were reviewed.    This procedure was well tolerated and there were no complications.   IMPRESSION: Succesful Ultrasound Guided: Injection

## 2018-02-08 NOTE — Progress Notes (Signed)
[name]. [name] [facility] at [facility] [phone]  Hallel Denherder - 42 y.o. female MRN [id number]  Date of birth: 07/14/1975  Visit Date: 02/08/2018  PCP: Helane Rima, DO   Referred by: Helane Rima, DO  Scribe(s) for today's visit: Stevenson Clinch, CMA  SUBJECTIVE:  Debra West is here for Follow-up (L wrist pain and locking)    08/17/2017: Her L ulnar-sided wrist pain symptoms INITIALLY: Began about 3 weeks ago w/ no MOI. Described as moderate sharp pain, radiating to L hand and forearm Worsened with movement and picking up objects Improved with rest, immobilization and ice Additional associated symptoms include: swelling and popping/clicking in the L ulnar wrist   At this time symptoms are worsening compared to onset w/ increased frequency and intensity of pain. She has been wearing an OTC wrist brace.  She has been taking IBU prn. L wrist X-ray on 08/12/17  09/22/2017: Compared to the last office visit, her previously described symptoms are improving. The pain increased the first week after the injection but then began to improved. She denies drainage, increased warmth or redness at the injection site. She did notice increased swelling and stiffness after the injection.  Current symptoms are mild & are nonradiating. She still has intermittent mild pain when grabbing thing at times but it does not linger like it did before.  She has not been wearing wrist brace or taking IBU.   02/08/2018: Compared to the last office visit, her previously described symptoms are worsening. Sx are now on the radial aspect of the wrist. Sx started to flare up about 1 week ago and getting progressively worse. No known injury. Constant throbbing pain, occasional sharp pain. The wrist will pop when she is holding it. She feels like she is unable to turn her wrist without pain. Pain on the ulnar aspect of the wrist has resolved. She has noticed some  swelling around her wrist.  Current symptoms are moderate (5-6/10) at rest but severe (10/10) when turning the wrist or when it locks up.  Pain radiates into the L thumb at times.  She has been taking IBU which helps a little with the throbbing. She gets some relief when wearing her wrist brace. She has been alternating heat and ice with short term relief. She has cut back on her exercises recently d/t the pain.   REVIEW OF SYSTEMS: Reports night time disturbances. Reports fevers, chills, or night sweats - recent cold sx. Denies unexplained weight loss. Denies personal history of cancer. Denies changes in bowel or bladder habits. Denies recent unreported falls. Denies new or worsening dyspnea or wheezing. Reports headaches.  Reports numbness in L hand. Denies dizziness or presyncopal episodes Denies lower extremity edema    HISTORY & PERTINENT PRIOR DATA:  Prior History reviewed and updated per electronic medical record.  Significant/pertinent history, findings, studies include:  reports that she has never smoked. She has never used smokeless tobacco. No results for input(s): HGBA1C, LABURIC, CREATINE in the last 8760 hours. No specialty comments available. No problems updated.  OBJECTIVE:  VS:  HT:5' 6.5" (168.9 cm)   WT:272 lb 6.4 oz (123.6 kg)  BMI:43.31    BP:130/88  HR:(!) 102bpm  TEMP: ( )  RESP:95 %   PHYSICAL EXAM: Constitutional: WDWN, Non-toxic appearing. Psychiatric: Alert & appropriately interactive.  Not depressed or anxious appearing. Respiratory: No increased work of breathing.  Trachea Midline Eyes: Pupils are equal.  EOM intact without nystagmus.  No  scleral icterus  Vascular Exam: warm to touch no edema  upper extremity neuro exam: unremarkable normal strength normal sensation  MSK Exam: Right wrist has a small amount of pain with direct palpation directly over the radial styloid and pain with Lourena Simmonds testing that is moderate.  She has reduced  grip strength on the right compared to the left.    ASSESSMENT   1. Right wrist pain   2. De Quervain's tenosynovitis      PROCEDURES:  US Guided Injection per procedure note      PLAN:  Pertinent additional documentation may be included in corresponding procedure notes, imaging studies, problem based documentation and patient instructions.  No problem-specific Assessment & Plan notes found for this encounter.   Symptoms are most consistent with de Quervain's tenosynovitis this time.  If any lack improvement further diagnostic evaluation with MRI will be considered.  Activity modifications and the importance of avoiding exacerbating activities (limiting pain to no more than a 4 / 10 during or following activity) recommended and discussed.  Discussed red flag symptoms that warrant earlier emergent evaluation and patient voices understanding.   No orders of the defined types were placed in this encounter.  Lab Orders  No laboratory test(s) ordered today   Imaging Orders     [place] [facility] POCT ULTRASOUND Referral Orders  No referral(s) requested today    Return in about 6 weeks (around 03/22/2018).     CMA/ATC served as Neurosurgeon during this visit. History, Physical, and Plan performed by medical provider. Documentation and orders reviewed and attested to.      Andrena Mews, DO    [place] Sports Medicine Physician

## 2018-02-08 NOTE — Patient Instructions (Addendum)

## 2018-02-19 IMAGING — CR DG CHEST 2V
2 series · 2 of 2 positions shown · non-contrast
Comparison: None.

CLINICAL DATA: Cough and shortness of Breath

EXAM:
CHEST  2 VIEW

[w chest pa]
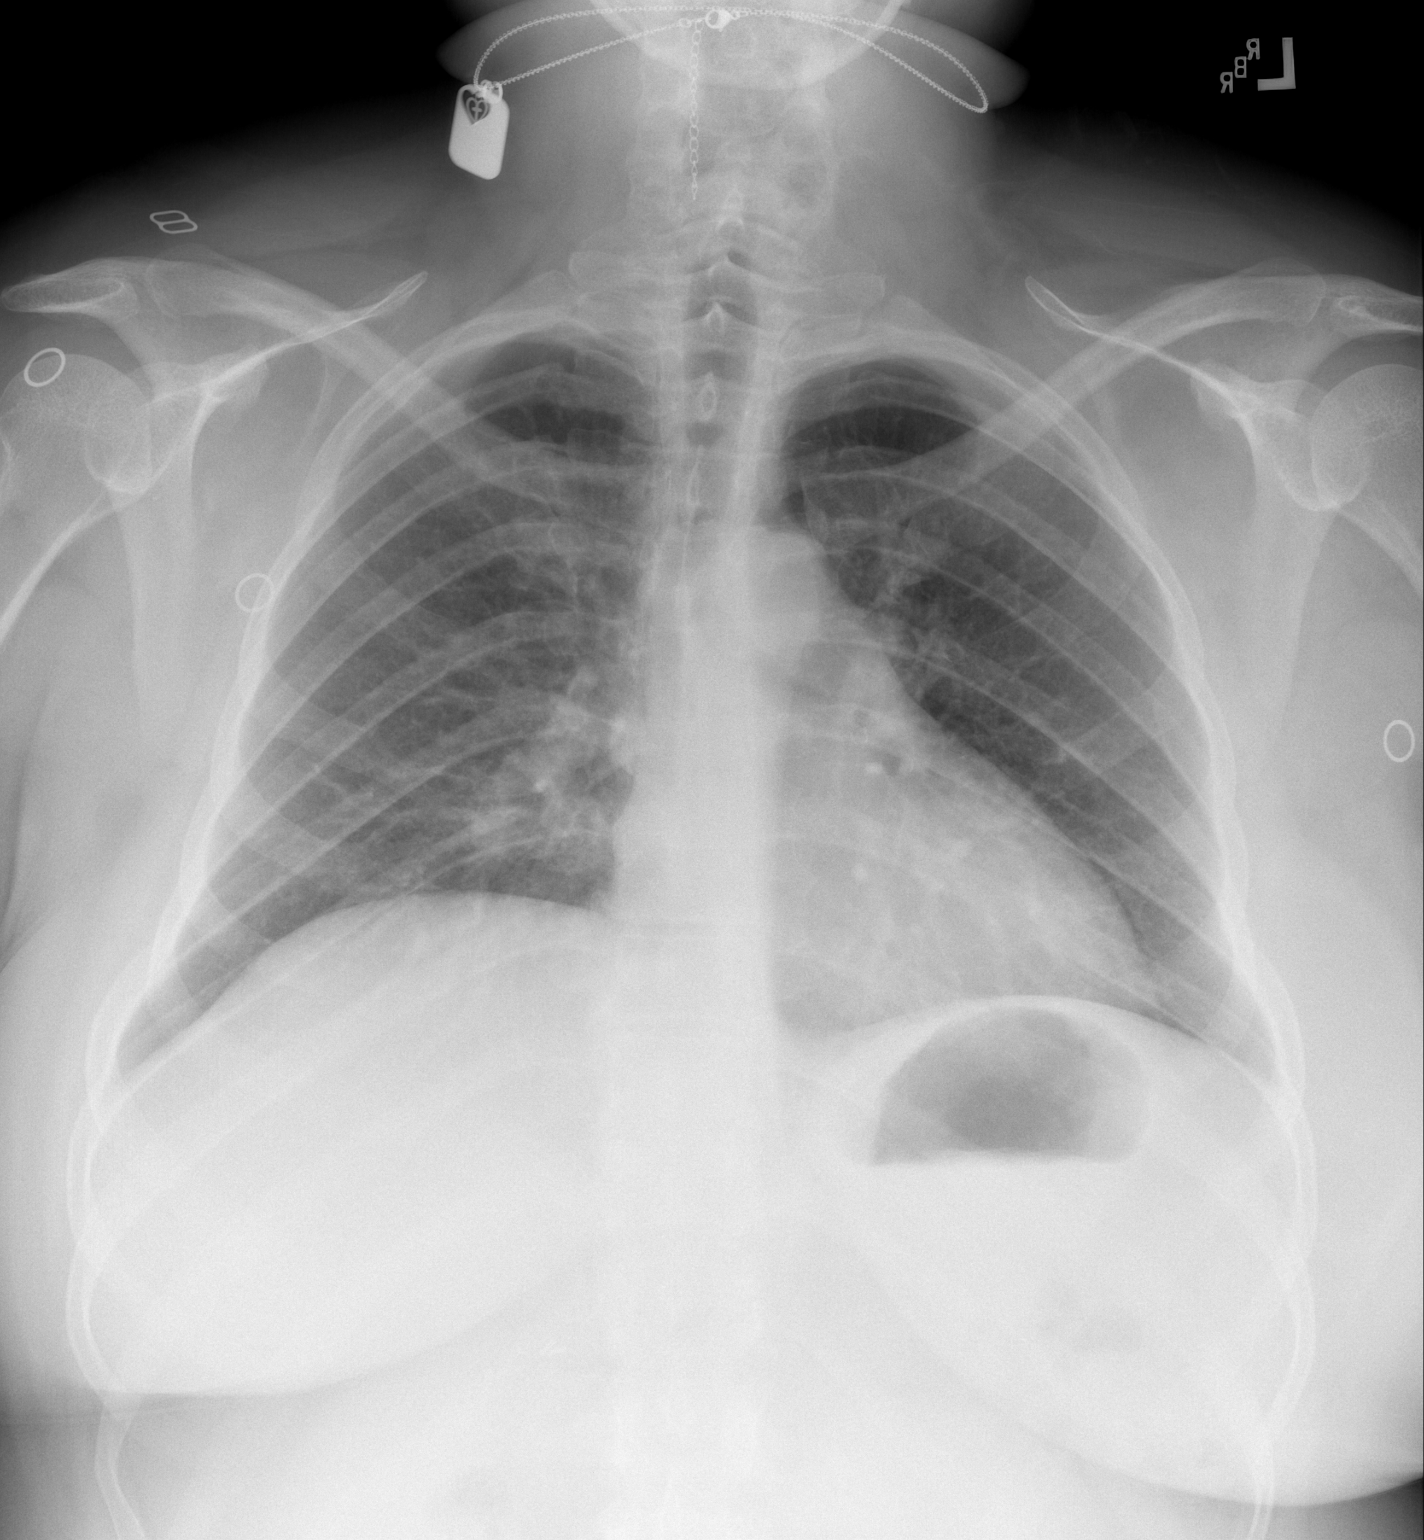

[w chest lat]
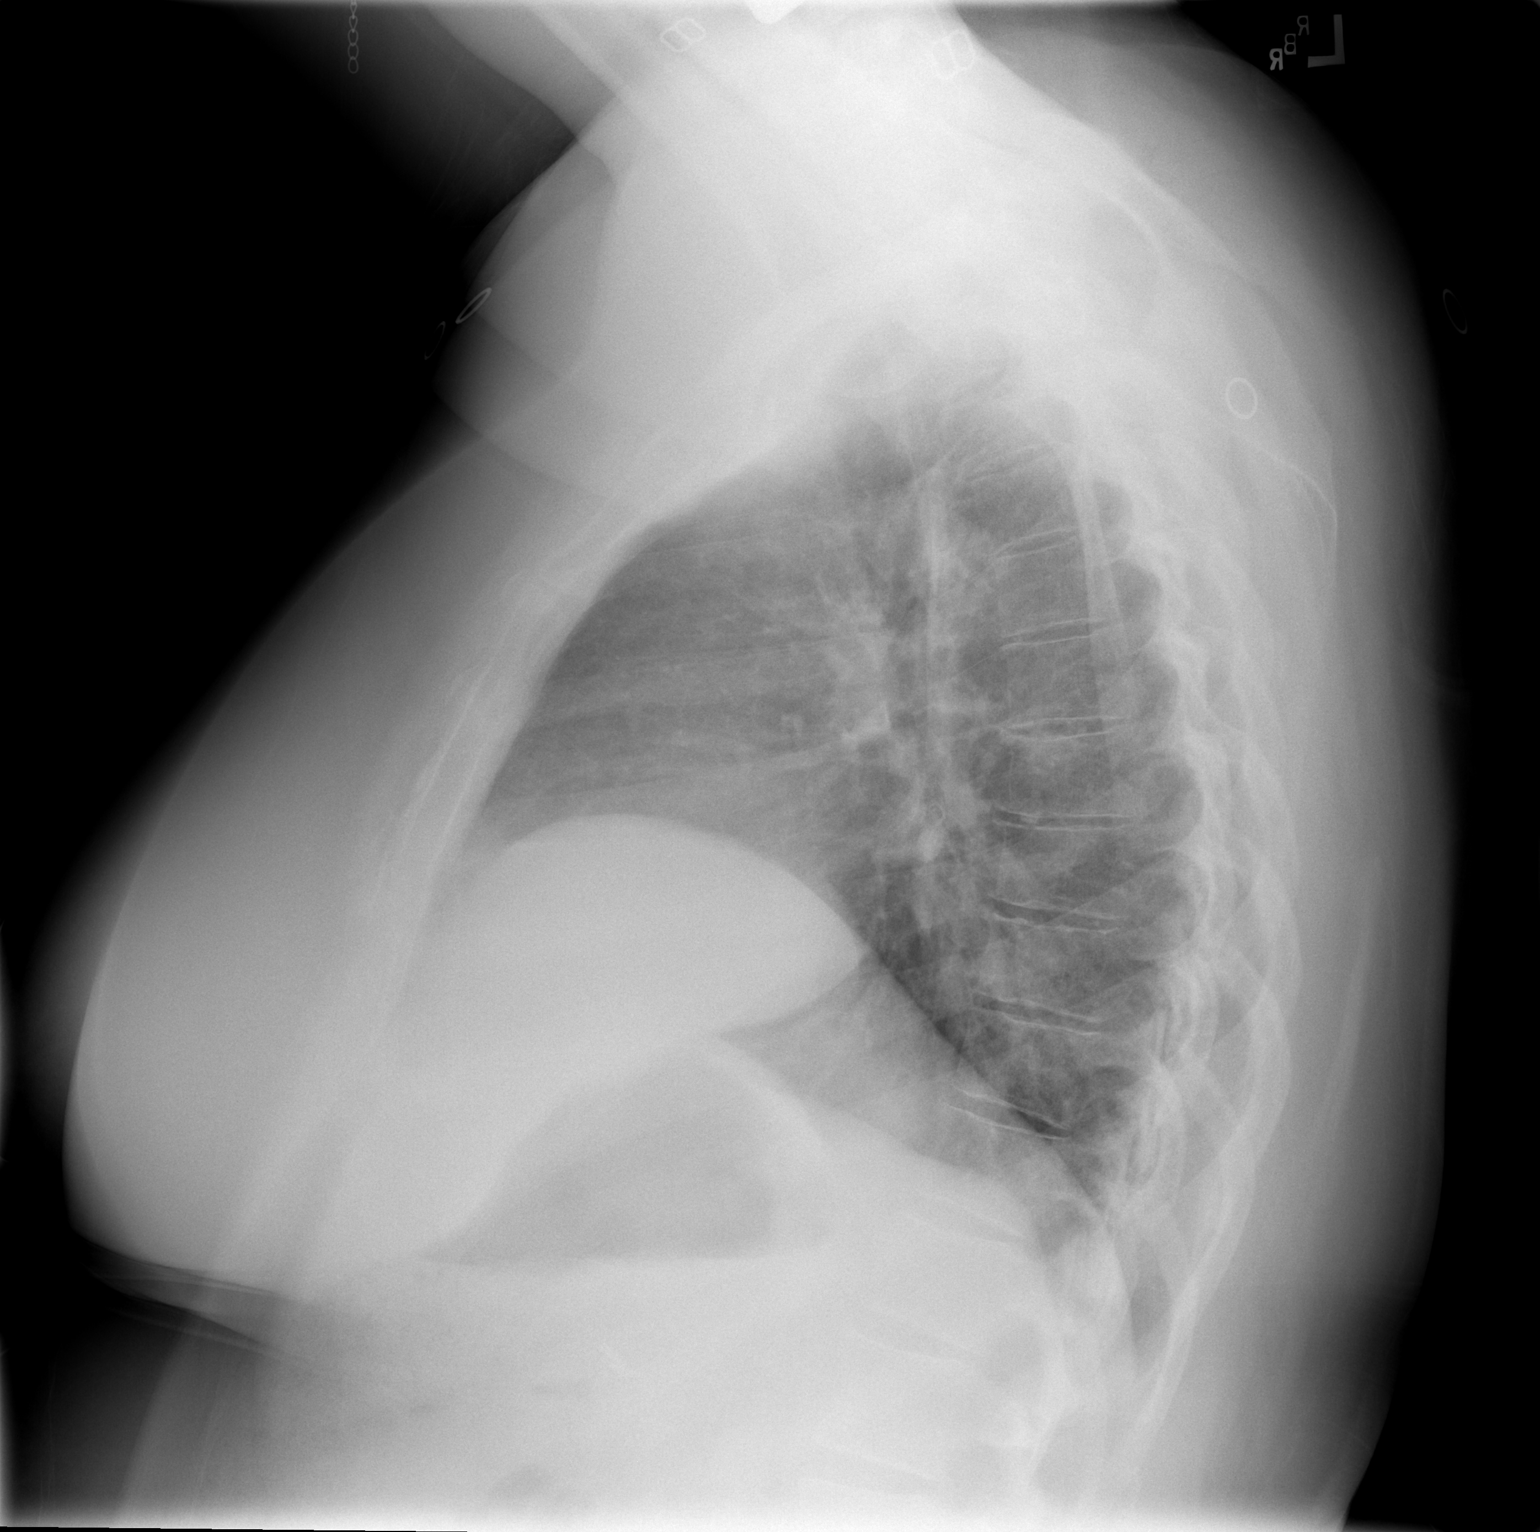

[2 of 2 positions shown; findings below may reference images not displayed]

FINDINGS: The heart size and mediastinal contours are within normal limits.
Both lungs are clear. The visualized skeletal structures are
unremarkable.
IMPRESSION: No active cardiopulmonary disease.

## 2018-03-23 ENCOUNTER — Ambulatory Visit: Payer: Managed Care, Other (non HMO) | Admitting: Sports Medicine

## 2018-03-23 ENCOUNTER — Encounter: Payer: Self-pay | Admitting: Sports Medicine

## 2018-03-23 VITALS — BP 130/84 | HR 87 | Ht 66.5 in | Wt 272.6 lb

## 2018-03-23 DIAGNOSIS — M25532 Pain in left wrist: Secondary | ICD-10-CM | POA: Diagnosis not present

## 2018-03-23 NOTE — Patient Instructions (Addendum)
We are ordering an MRI for you today.  The imaging office will be calling you to schedule your appointment after we obtain authorization from your insurance company.   Please be sure you have signed up for MyChart so that we can get your results to you.  We will be in touch with you as soon as we can.  Please know, it can take up to 3-4 business days for the radiologist and Dr. Berline Choughigby to have time to review the results and determine the best appropriate action.  If there is something that appears to be surgical or needs a referral to other specialists we will let you know through MyChart or telephone.  Otherwise we will plan to schedule a follow up appointment with Dr. Berline Choughigby once we have the results.   If you have not heard from [facility] by next Monday, you can call them directly at [phone]

## 2018-03-23 NOTE — Progress Notes (Signed)
  Debra FellsMichael D. Delorise Shinerigby, DO  [facility] [facility] [phone]  Roda Shuttersicole Zipper - 42 y.o. female MRN [id number]  Date of birth: May 14, 1975  Visit Date: 03/23/2018  PCP: Helane RimaWallace, Erica, DO   Referred by: Helane RimaWallace, Erica, DO   SUBJECTIVE:  Chief Complaint  Patient presents with  . Follow-up    L wrist pain   HPI: Patient presents with radial sided left wrist pain.  She underwent an injection on 02/08/2018 that did not help.  Actually seemed to make things worse.  The symptoms did improve after approximately 2 weeks but she did have bruising following it.  She is now having some clicking and locking in her left wrist.  There is some radiation into the left forearm.  She is taking ibuprofen intermittently and using heat and ice.  Wrist brace while she is at work.  REVIEW OF SYSTEMS: She is having some nighttime awakenings and discomfort due to this.  She has chronic headaches.  Otherwise 12 point review of systems is negative.  HISTORY:  Prior history reviewed and updated per electronic medical record.  Social History   Occupational History  . Occupation: Chiropodistcientist  Tobacco Use  . Smoking status: Never Smoker  . Smokeless tobacco: Never Used  Substance and Sexual Activity  . Alcohol use: Yes    Comment: Occasionally  . Drug use: No  . Sexual activity: Never    Birth control/protection: Pill   Social History   Social History Narrative   [place] from [place]. Prefers alternative medicine, but open to traditional medicine.      DATA OBTAINED & REVIEWED:  Recent Labs    10/26/17 1537  CALCIUM 9.5  AST 34  ALT 33  TSH 3.24   No problems updated. No specialty comments available.  OBJECTIVE:  VS:  HT:5' 6.5" (168.9 cm)   WT:272 lb 9.6 oz (123.7 kg)  BMI:43.34    BP:130/84  HR:87bpm  TEMP: ( )  RESP:97 %   PHYSICAL EXAM: Adult female.  In no acute distress.  Alert appropriate.  Her left wrist is overall well aligned.  She has  pain with axial load of the TFCC.  Grip strength is intact.  Wrist extension is painful.    ASSESSMENT   1. Left wrist pain     PLAN:  Pertinent additional documentation may be included in corresponding procedure notes, imaging studies, problem based documentation and patient instructions.  Procedures:  None  Medications:  No orders of the defined types were placed in this encounter.   Discussion/Instructions: No problem-specific Assessment & Plan notes found for this encounter.  Ultimately patient is failed conservative measures including intra-articular injection and bracing.  She will benefit from further diagnostic evaluation with MR arthrogram.   Return for MRI results review at office.          Andrena MewsMichael D Mansel Strother, DO    [place] [facility] Physician

## 2018-03-26 ENCOUNTER — Telehealth: Payer: Self-pay | Admitting: Family Medicine

## 2018-03-26 NOTE — Telephone Encounter (Signed)
Copied from CRM [phone]. Topic: Quick Communication - See Telephone Encounter >> Mar 26, 2018  4:56 PM Jens SomMedley, Jennifer A wrote: CRM for notification. See Telephone encounter for: 03/26/18.  Patient is calling [facility] Imaging-Patient is asking if the order can be resent.  Please advise [phone]. Patient call back number [phone]

## 2018-03-26 NOTE — Telephone Encounter (Signed)
See note

## 2018-03-26 NOTE — Telephone Encounter (Signed)
Informed pt and told her we will try to handle this issue next week as our office has closed.

## 2018-03-30 NOTE — Telephone Encounter (Signed)
[name] calling from [facility] [phone] stating that they do not have the order for the MRI Imaging fax [phone]

## 2018-03-30 NOTE — Telephone Encounter (Signed)
See note

## 2018-03-30 NOTE — Telephone Encounter (Signed)
Order faxed.

## 2018-03-30 NOTE — Telephone Encounter (Signed)
Please advise 

## 2018-03-30 NOTE — Telephone Encounter (Signed)
Hey can you send that for us?

## 2018-04-15 ENCOUNTER — Encounter: Payer: Self-pay | Admitting: Sports Medicine

## 2018-04-15 ENCOUNTER — Ambulatory Visit: Payer: Managed Care, Other (non HMO) | Admitting: Sports Medicine

## 2018-04-15 VITALS — BP 120/86 | HR 94 | Ht 66.5 in | Wt 270.8 lb

## 2018-04-15 DIAGNOSIS — M25532 Pain in left wrist: Secondary | ICD-10-CM

## 2018-04-15 DIAGNOSIS — M67432 Ganglion, left wrist: Secondary | ICD-10-CM

## 2018-04-15 MED ORDER — CELECOXIB 100 MG PO CAPS: 100.0000 mg | ORAL_CAPSULE | Freq: Two times a day (BID) | ORAL | 2 refills | Status: DC | PRN

## 2018-04-15 NOTE — Progress Notes (Signed)
Debra West D. Debra Shinerigby, DO  [facility] [place] [facility] [phone]  Debra West - 43 y.o. female MRN [id number]  Date of birth: 07-03-1975  Visit Date:   PCP: Debra West, Erica, DO   Referred by: Debra West, Erica, DO   SUBJECTIVE:  Chief Complaint  Patient presents with  . f/u L wrist    MRI L wrist 04/09/2018. XR L wrist 08/12/17. Joint injection 02/08/18. Has tried IBU and wrist brace.     HPI: Patient is here for follow-up after MRI of her left wrist.  This was reviewed in care everywhere however unfortunately the images are not available she did not have a disc provided.  There are multiple findings on the MRI and she does continue to have fairly significant pain following the de Quervain's injection at her last visit.  She previously had an intra-articular wrist injection that provided better improvement and the most recent injection which only helped for 2 weeks.  She continues to have pain directly over the radial styloid most focally but does have ulnar-sided pain as well as palmar sided pain especially with wrist flexion.  She denies any significant numbness or tingling into the hand but does feel like the whole wrist is swollen.  REVIEW OF SYSTEMS: Pain does continue to keep her awake at night.  Nighttime splint does help with this slightly.  She does have a history of headaches.  Otherwise 12 point review of systems reviewed and was negative.  HISTORY:  Prior history reviewed and updated per electronic medical record.  Social History   Occupational History  . Occupation: Chiropodistcientist  Tobacco Use  . Smoking status: Never Smoker  . Smokeless tobacco: Never Used  Substance and Sexual Activity  . Alcohol use: Yes    Comment: Occasionally  . Drug use: No  . Sexual activity: Never    Birth control/protection: Pill   Social History   Social History Narrative   [place]oved from [place]. Prefers alternative medicine, but open to traditional medicine.         DATA OBTAINED & REVIEWED:  Recent Labs    10/26/17 1537  CALCIUM 9.5  AST 34  ALT 33  TSH 3.24   Problem  Ganglion Cyst of Volar Aspect of Left Wrist  Left Wrist Pain   MRI 04/09/2018: Result Impression   1. Volar ganglion extending into the distal carpal tunnel with minimal mass effect on the median nerve. 2. Partial tear of the styloid attachment of the distal lamina of the TFCC. Fluid signal at the peripheral capsular attachment of the TFCC, which could reflect a focal partial tear.  3. Degenerative fraying of the central articular disc of the TFCC.   4. Mild fourth compartment tenosynovitis. 5. Moderate extensor carpi ulnaris tendinosis. 6. Fraying of the membranous portion of the scapholunate ligament. No scapholunate interval widening.       No specialty comments available.     OBJECTIVE:  VS:  HT:5' 6.5" (168.9 cm)   WT:270 lb 12.8 oz (122.8 kg)  BMI:43.06    BP:120/86  HR:94bpm  TEMP: ( )  RESP:96 %   PHYSICAL EXAM: Adult female.  No acute distress.  Alert and appropriate.  She has pain over the scapholunate interval that is mild.  Most focal direct pains over the lateral wrist at the TFCC.  She does have pain with axial load and ulnar deviation of the wrist.  The moderately positive Lourena SimmondsFinkelstein test has resolved and she has no focal pain with  Finkelstein testing but does continue to have a small amount of pain with direct palpation over the radial styloid.  Wrist flexion does cause some numbness and tingling radiating into the hand which resolves with coming out of this position.  She has no pain with carpal tunnel compression test or with Tinel's at the wrist.  Her grip strength is normal.  She has no thenar atrophy.   ASSESSMENT   1. Left wrist pain   2. Ganglion cyst of volar aspect of left wrist     PLAN:  Pertinent additional documentation may be included in corresponding procedure notes, imaging studies, problem based documentation and  patient instructions.  Procedures:  None  Medications:  Meds ordered this encounter  Medications  . celecoxib (CELEBREX) 100 MG capsule    Sig: Take 1 capsule (100 mg total) by mouth 2 (two) times daily as needed.    Dispense:  60 capsule    Refill:  2    Discussion/Instructions: Left wrist pain >50% of this 25 minute visit spent in direct patient counseling and/or coordination of care.  Discussion was focused on education regarding the in discussing the pathoetiology and anticipated clinical course of the above condition.  She did do better with the intra-articular injection performed previously and had only minimal improvements with the de Quervain's injection that she had but does continue to have most focal pain directly over the radial styloid.  Given the multifaceted etiology of her pain we discussed the options of repeating intra-articular injection today versus obtaining surgical consultation for likely arthroscopic intervention.  She opted for surgical consultation and referral was placed [facility].  I will defer to their expertise going forward happy to see her back at any point for any other musculoskeletal complaint.   Return if symptoms worsen or fail to improve.          Debra MewsMichael D , DO    [place] [facility]

## 2018-04-16 ENCOUNTER — Ambulatory Visit: Payer: Managed Care, Other (non HMO) | Admitting: Sports Medicine

## 2018-04-16 ENCOUNTER — Encounter: Payer: Self-pay | Admitting: Sports Medicine

## 2018-04-16 DIAGNOSIS — M67432 Ganglion, left wrist: Secondary | ICD-10-CM | POA: Insufficient documentation

## 2018-04-16 DIAGNOSIS — M25532 Pain in left wrist: Secondary | ICD-10-CM | POA: Insufficient documentation

## 2018-04-16 NOTE — Assessment & Plan Note (Signed)
>  50% of this 25 minute visit spent in direct patient counseling and/or coordination of care.  Discussion was focused on education regarding the in discussing the pathoetiology and anticipated clinical course of the above condition.  She did do better with the intra-articular injection performed previously and had only minimal improvements with the de Quervain's injection that she had but does continue to have most focal pain directly over the radial styloid.  Given the multifaceted etiology of her pain we discussed the options of repeating intra-articular injection today versus obtaining surgical consultation for likely arthroscopic intervention.  She opted for surgical consultation and referral was placed to hand surgery in [facility].  I will defer to their expertise going forward happy to see her back at any point for any other musculoskeletal complaint.

## 2018-04-20 ENCOUNTER — Telehealth: Payer: Self-pay | Admitting: Family Medicine

## 2018-04-20 ENCOUNTER — Telehealth: Payer: Self-pay

## 2018-04-20 NOTE — Telephone Encounter (Signed)
Pt returning call, please call back at [phone].

## 2018-04-20 NOTE — Telephone Encounter (Signed)
Copied from CRM [id number]. Topic: Quick Communication - Rx Refill/Question >> Apr 20, 2018  9:19 AM Maia Petties wrote: Medication: celecoxib (CELEBREX) 100 MG capsule - pt states the pharmacy cannot fill - PA is needed - pt also asking about referral for surgeon as she has not heard from anyone - please f/u with pt  Has the patient contacted their pharmacy? yes Preferred Pharmacy (with phone number or street name): [id number] [place], [place] - [address] [phone] (Phone) [phone] (Fax)

## 2018-04-20 NOTE — Telephone Encounter (Signed)
This medication is part of program, Step Therapy. Requires trial of a step-one medication before a more expensive step-two or step three medication is covered.  Step-One Medications Try 2 oral NSAIDs before celecoxib Tray 2 oral NSAIDs & Celecoxib before Celebrex.   Preferred NSAIDS include:  diclofenac sodium (IR and ER) [Voltaren/XR], diclofenac potassium (Cataflam),  etodolac(IR and ER) [Lodine/XL],  flurbiprofen (Ansaid),  nabumetone (Relafen),  meloxicam (Mobic),  ibuprofen (Motrin),  indomethacin (IR and ER) [Indocin/SR],  ketoprofen (IR) [Orudis],  ketorolac (oral) [Toradol],  meclofenamate (Meclomen),  tolmetin (tolmetin 400 mg and 600 mg are not preferred), diclofenac sodium and misoprostol (Arthrotec),  naproxen (Naprosyn) [generics for Naprelan and Naproxen suspension are not preferred],  oxaprozin (Daypro),  piroxicam (Feldene),  sulindac (Clinoril),  mefenamic acid (Ponstel).  Please note: Over-the-counter (OTC) NSAIDs count as alternatives when the patient used prescription-strength doses. OTC dose of ibuprofen (Motrin, Advil) = 200 mg; OTC dose of naproxen sodium (Aleve) is 220 mg.   Send in alternative medication or proceed with prior auth?  Forwarding to Dr. Berline Chough to advise.

## 2018-04-20 NOTE — Telephone Encounter (Signed)
If we can get the prior authorization completed easily that would be ideal otherwise we can send in a prescription to [facility] or [facility] using [facility] for between $15-$25.  Other option is acceptable.  I would like for her to try the Cox 2 inhibitor as she has not done this in the past.

## 2018-04-20 NOTE — Telephone Encounter (Signed)
Called pt and left VM to call the office (going to confirm tried/failed oral NSAID's).

## 2018-04-20 NOTE — Telephone Encounter (Signed)
See note

## 2018-04-20 NOTE — Telephone Encounter (Signed)
Spoke with pt, she has tried IBU and Naproxen with inadequate response.

## 2018-04-20 NOTE — Telephone Encounter (Signed)
I will take care of issue with rx.  Judeth Cornfield, can you follow-up on referral please.   Thank you!

## 2018-04-20 NOTE — Telephone Encounter (Signed)
PA initiated via [email or web address]  Roda Shuttersicole Settlemyre (Key: [id number])   This request has been approved. Please note any additional information provided by [facility] [employer] at the bottom of your screen.

## 2018-05-01 ENCOUNTER — Encounter: Payer: Self-pay | Admitting: Sports Medicine

## 2018-05-04 ENCOUNTER — Telehealth: Payer: Self-pay | Admitting: Family Medicine

## 2018-05-04 NOTE — Telephone Encounter (Signed)
See note  Copied from CRM [id number]. Topic: General - Inquiry >> May 04, 2018  2:20 PM Windy Kalata, NT wrote: Reason for CRM: patient is calling and states that she was referred to [facility] for surgery for her wrist and the doctor is wanting copies of her xray that was done in our office. Please advise.

## 2018-08-02 ENCOUNTER — Other Ambulatory Visit: Payer: Self-pay | Admitting: Family Medicine

## 2018-08-04 ENCOUNTER — Ambulatory Visit (INDEPENDENT_AMBULATORY_CARE_PROVIDER_SITE_OTHER): Payer: Managed Care, Other (non HMO) | Admitting: Family Medicine

## 2018-08-04 ENCOUNTER — Other Ambulatory Visit: Payer: Self-pay

## 2018-08-04 ENCOUNTER — Encounter: Payer: Self-pay | Admitting: Family Medicine

## 2018-08-04 VITALS — Temp 98.2°F | Ht 66.5 in | Wt 270.0 lb

## 2018-08-04 DIAGNOSIS — N83202 Unspecified ovarian cyst, left side: Secondary | ICD-10-CM

## 2018-08-04 DIAGNOSIS — J4540 Moderate persistent asthma, uncomplicated: Secondary | ICD-10-CM | POA: Diagnosis not present

## 2018-08-04 DIAGNOSIS — D510 Vitamin B12 deficiency anemia due to intrinsic factor deficiency: Secondary | ICD-10-CM

## 2018-08-04 DIAGNOSIS — Z13220 Encounter for screening for lipoid disorders: Secondary | ICD-10-CM | POA: Diagnosis not present

## 2018-08-04 DIAGNOSIS — M545 Low back pain, unspecified: Secondary | ICD-10-CM | POA: Insufficient documentation

## 2018-08-04 DIAGNOSIS — R1013 Epigastric pain: Secondary | ICD-10-CM | POA: Diagnosis not present

## 2018-08-04 DIAGNOSIS — F5104 Psychophysiologic insomnia: Secondary | ICD-10-CM | POA: Insufficient documentation

## 2018-08-04 DIAGNOSIS — E039 Hypothyroidism, unspecified: Secondary | ICD-10-CM | POA: Diagnosis not present

## 2018-08-04 DIAGNOSIS — L209 Atopic dermatitis, unspecified: Secondary | ICD-10-CM | POA: Insufficient documentation

## 2018-08-04 DIAGNOSIS — E559 Vitamin D deficiency, unspecified: Secondary | ICD-10-CM | POA: Insufficient documentation

## 2018-08-04 DIAGNOSIS — F4321 Adjustment disorder with depressed mood: Secondary | ICD-10-CM

## 2018-08-04 MED ORDER — BECLOMETHASONE DIPROP HFA 80 MCG/ACT IN AERB: 2.0000 | INHALATION_SPRAY | Freq: Two times a day (BID) | RESPIRATORY_TRACT | 3 refills | Status: DC

## 2018-08-04 MED ORDER — NORETHINDRONE ACET-ETHINYL EST 1.5-30 MG-MCG PO TABS: 1.0000 | ORAL_TABLET | Freq: Every day | ORAL | 3 refills | Status: DC

## 2018-08-04 MED ORDER — TRIAMCINOLONE ACETONIDE 0.025 % EX OINT: 1.0000 "application " | TOPICAL_OINTMENT | Freq: Two times a day (BID) | CUTANEOUS | 0 refills | Status: DC

## 2018-08-04 MED ORDER — PANTOPRAZOLE SODIUM 40 MG PO TBEC: 40.0000 mg | DELAYED_RELEASE_TABLET | Freq: Every day | ORAL | 1 refills | Status: DC

## 2018-08-04 MED ORDER — CHOLECALCIFEROL 1.25 MG (50000 UT) PO TABS: ORAL_TABLET | ORAL | 0 refills | Status: DC

## 2018-08-04 MED ORDER — CYCLOBENZAPRINE HCL 5 MG PO TABS: 5.0000 mg | ORAL_TABLET | Freq: Two times a day (BID) | ORAL | 0 refills | Status: DC | PRN

## 2018-08-04 MED ORDER — TRAZODONE HCL 50 MG PO TABS: 50.0000 mg | ORAL_TABLET | Freq: Every day | ORAL | 3 refills | Status: DC

## 2018-08-04 MED ORDER — FLUOXETINE HCL 20 MG PO CAPS: ORAL_CAPSULE | ORAL | 1 refills | Status: DC

## 2018-08-04 NOTE — Progress Notes (Signed)
Virtual Visit via Video   Due to the COVID-19 pandemic, this visit was completed with telemedicine (audio/video) technology to reduce patient and provider exposure as well as to preserve personal protective equipment.   I connected with Debra West on 08/04/18 at  1:40 PM EDT by a video enabled telemedicine application and verified that I am speaking with the correct person using two identifiers. Location patient: Home Location provider: [facility], Office Persons participating in the virtual visit: Laurian, [place], DO [name], CMA acting as scribe for Dr. Helane Rima.   I discussed the limitations of evaluation and management by telemedicine and the availability of in person appointments. The patient expressed understanding and agreed to proceed.  Care Team   Patient Care Team: Helane Rima, DO as PCP - General (Family Medicine) Andrena Mews, DO as Consulting Physician (Sports Medicine)  Subjective:   HPI: Patient in for follow up. She is working from home right now.   She is having increase in depression and anxiety symptoms with Covid. She is very tearful for no reason. We have reviewed all medications and how she has been taking them to make sure there is no reason for that.   Review of Systems  Constitutional: Negative for chills and fever.  HENT: Negative for hearing loss.   Eyes: Negative for blurred vision.  Respiratory: Negative for cough.   Cardiovascular: Negative for chest pain and palpitations.  Gastrointestinal: Negative for heartburn and nausea.  Genitourinary: Negative for dysuria.  Musculoskeletal: Negative for myalgias.  Skin: Negative for rash.  Neurological: Negative for dizziness.  Endo/Heme/Allergies: Does not bruise/bleed easily.  Psychiatric/Behavioral: Positive for depression.       Patient is on medications for depression.     Patient Active Problem List   Diagnosis Date Noted  . Vitamin D deficiency 08/04/2018    . Psychophysiological insomnia 08/04/2018  . Dyspepsia 08/04/2018  . Atopic dermatitis 08/04/2018  . Intermittent low back pain 08/04/2018  . Ganglion cyst of volar aspect of left wrist 04/16/2018  . Left wrist pain 04/16/2018  . Obstructive sleep apnea syndrome 11/27/2016  . 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 Pernicious anemia 05/16/2016  . Morbid obesity due to excess calories (HCC) 05/16/2016  . Migraine without aura and without status migrainosus, not intractable   . Gastroesophageal reflux disease   . Hypothyroidism 01/17/2016  . Left ovarian cyst 03/16/2014  . Diaphragmatic hernia 06/14/2010  . Adjustment disorder with depressed mood 07/12/2008  . 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    Social History   Tobacco Use  . Smoking status: Never Smoker  . Smokeless tobacco: Never Used  Substance Use Topics  . Alcohol use: Yes    Comment: Occasionally    Current Outpatient Medications:  .  Acetylcysteine 600 MG CAPS, Take by mouth., Disp: , Rfl:  .  beclomethasone (QVAR REDIHALER) 80 MCG/ACT inhaler, Inhale 2 puffs into the lungs 2 (two) times daily., Disp: 1 Inhaler, Rfl: 3 .  celecoxib (CELEBREX) 100 MG capsule, Take 1 capsule (100 mg total) by mouth 2 (two) times daily as needed., Disp: 60 capsule, Rfl: 2 .  Cholecalciferol 50000 units TABS, 50,000 units PO qwk for 12 weeks., Disp: 12 tablet, Rfl: 0 .  cyclobenzaprine (FLEXERIL) 5 MG tablet, Take 1 tablet (5 mg total) by mouth 2 (two) times daily as needed for muscle spasms., Disp: 20 tablet, Rfl: 0 .  Diclofenac Sodium (PENNSAID) 2 % SOLN, Place 1 application onto the skin 2 (two) times daily as  needed., Disp: 8 g, Rfl: 0 .  FLUoxetine (PROZAC) 20 MG capsule, TAKE 1 CAPSULE BY MOUTH DAILY EVERY MORNING (MAX #30 ON INS), Disp: 90 capsule, Rfl: 0 .  ibuprofen (ADVIL,MOTRIN) 200 MG tablet, Take 2-3 tablets (400-600 mg total) by mouth every 8 (eight) hours as needed for fever, headache, mild pain, moderate pain or cramping.,  Disp: , Rfl:  .  levothyroxine (SYNTHROID) 50 MCG tablet, TAKE 1 TABLET BY MOUTH EVERY DAY, Disp: 30 tablet, Rfl: 2 .  metFORMIN (GLUCOPHAGE) 500 MG tablet, Take 1 tablet (500 mg total) by mouth 2 (two) times daily with a meal., Disp: 180 tablet, Rfl: 3 .  midodrine (PROAMATINE) 5 MG tablet, Take 5 mg by mouth 3 (three) times daily as needed (for low blood pressure (less than 105)). , Disp: , Rfl:  .  Norethindrone Acetate-Ethinyl Estradiol (LOESTRIN 1.5/30, 21,) 1.5-30 MG-MCG tablet, Take 1 tablet by mouth daily., Disp: 1 Package, Rfl: 11 .  pantoprazole (PROTONIX) 40 MG tablet, TAKE 1 TABLET (40 MG TOTAL) BY MOUTH DAILY. TAKE 30-60 MIN BEFORE FIRST MEAL OF THE DAY, Disp: 30 tablet, Rfl: 0 .  triamcinolone (KENALOG) 0.025 % ointment, Apply 1 application topically 2 (two) times daily., Disp: 30 g, Rfl: 0  Allergies  Allergen Reactions  . Depo-Medrol [Methylprednisolone Acetate] Shortness Of Breath  . Aspirin Nausea And Vomiting   Objective:   VITALS: Per patient if applicable, see vitals. GENERAL: Alert, appears well and in no acute distress. HEENT: Atraumatic, conjunctiva clear, no obvious abnormalities on inspection of external nose and ears. NECK: Normal movements of the head and neck. CARDIOPULMONARY: No increased WOB. Speaking in clear sentences. I:E ratio WNL.  MS: Moves all visible extremities without noticeable abnormality. PSYCH: Pleasant and cooperative, well-groomed. Speech normal rate and rhythm. Affect is appropriate. Insight and judgement are appropriate. Attention is focused, linear, and appropriate.  NEURO: CN grossly intact. Oriented as arrived to appointment on time with no prompting. Moves both UE equally.  SKIN: No obvious lesions, wounds, erythema, or cyanosis noted on face or hands.  Depression screen PHQ 2/9 05/15/2016  Decreased Interest 0  Down, Depressed, Hopeless 0  PHQ - 2 Score 0   Assessment and Plan:   [name] was seen today for follow-up.  Diagnoses and  all orders for this visit:  Acquired hypothyroidism Comments: Feels euthyroid. Labs due.  Orders: -     TSH; Future -     T4, free; Future  Vitamin D deficiency -     Cholecalciferol 1.25 MG (50000 UT) TABS; 50,000 units PO qwk for 12 weeks.  Moderate persistent asthma without complication -     beclomethasone (QVAR REDIHALER) 80 MCG/ACT inhaler; Inhale 2 puffs into the lungs 2 (two) times daily.  Intermittent low back pain -     cyclobenzaprine (FLEXERIL) 5 MG tablet; Take 1 tablet (5 mg total) by mouth 2 (two) times daily as needed for muscle spasms.  Left ovarian cyst -     Norethindrone Acetate-Ethinyl Estradiol (LOESTRIN 1.5/30, 21,) 1.5-30 MG-MCG tablet; Take 1 tablet by mouth daily.  Atopic dermatitis, unspecified type -     triamcinolone (KENALOG) 0.025 % ointment; Apply 1 application topically 2 (two) times daily.  Pernicious anemia -     CBC with Differential/Platelet; Future -     Vitamin B12; Future  Morbid obesity due to excess calories (HCC)  Psychophysiological insomnia -     traZODone (DESYREL) 50 MG tablet; Take 1 tablet (50 mg total) by mouth at bedtime.  Adjustment disorder with depressed mood -     FLUoxetine (PROZAC) 20 MG capsule; TAKE 1 CAPSULE BY MOUTH DAILY EVERY MORNING (MAX #30 ON INS)  Dyspepsia -     pantoprazole (PROTONIX) 40 MG tablet; Take 1 tablet (40 mg total) by mouth daily. Take 30-60 min before first meal of the day -     Comprehensive metabolic panel; Future -     VITAMIN D 25 Hydroxy (Vit-D Deficiency, Fractures); Future  Malaise and fatigue  Screening for lipid disorders -     Lipid panel; Future    . COVID-19 Education: The signs and symptoms of COVID-19 were discussed with the patient and how to seek care for testing if needed. The importance of social distancing was discussed today. . Reviewed expectations re: course of current medical issues. . Discussed self-management of symptoms. . Outlined signs and symptoms indicating  need for more acute intervention. . Patient verbalized understanding and all questions were answered. Marland Kitchen. Health Maintenance issues including appropriate healthy diet, exercise, and smoking avoidance were discussed with patient. . See orders for this visit as documented in the electronic medical record.  Helane RimaErica Drue Camera, DO 08/04/2018  Records requested if needed. Time spent: 25 minutes, of which >50% was spent in obtaining information about her symptoms, reviewing her previous labs, evaluations, and treatments, counseling her about her condition (please see the discussed topics above), and developing a plan to further investigate it; she had a number of questions which I addressed.

## 2018-08-05 ENCOUNTER — Other Ambulatory Visit: Payer: Managed Care, Other (non HMO)

## 2018-08-05 ENCOUNTER — Ambulatory Visit (INDEPENDENT_AMBULATORY_CARE_PROVIDER_SITE_OTHER): Payer: Managed Care, Other (non HMO) | Admitting: Physician Assistant

## 2018-08-05 DIAGNOSIS — D510 Vitamin B12 deficiency anemia due to intrinsic factor deficiency: Secondary | ICD-10-CM | POA: Diagnosis not present

## 2018-08-05 LAB — COMPREHENSIVE METABOLIC PANEL
ALT: 31 U/L (ref 0–35)
AST: 37 U/L (ref 0–37)
Albumin: 4.2 g/dL (ref 3.5–5.2)
Alkaline Phosphatase: 54 U/L (ref 39–117)
BUN: 9 mg/dL (ref 6–23)
CO2: 27 mEq/L (ref 19–32)
Calcium: 9 mg/dL (ref 8.4–10.5)
Chloride: 99 mEq/L (ref 96–112)
Creatinine, Ser: 0.58 mg/dL (ref 0.40–1.20)
GFR: 112.96 mL/min (ref >60.00)
Glucose, Bld: 102 mg/dL — ABNORMAL HIGH (ref 70–99)
Potassium: 4.1 mEq/L (ref 3.5–5.1)
Sodium: 138 mEq/L (ref 135–145)
Total Bilirubin: 0.4 mg/dL (ref 0.2–1.2)
Total Protein: 6.9 g/dL (ref 6.0–8.3)

## 2018-08-05 LAB — CBC WITH DIFFERENTIAL/PLATELET
Basophils Absolute: 0.1 10*3/uL (ref 0.0–0.1)
Basophils Relative: 0.6 % (ref 0.0–3.0)
Eosinophils Absolute: 0.2 10*3/uL (ref 0.0–0.7)
Eosinophils Relative: 2 % (ref 0.0–5.0)
HCT: 39.4 % (ref 36.0–46.0)
Hemoglobin: 13.2 g/dL (ref 12.0–15.0)
Lymphocytes Relative: 25.5 % (ref 12.0–46.0)
Lymphs Abs: 2.8 10*3/uL (ref 0.7–4.0)
MCHC: 33.4 g/dL (ref 30.0–36.0)
MCV: 83 fl (ref 78.0–100.0)
Monocytes Absolute: 0.5 10*3/uL (ref 0.1–1.0)
Monocytes Relative: 4.6 % (ref 3.0–12.0)
Neutro Abs: 7.5 10*3/uL (ref 1.4–7.7)
Neutrophils Relative %: 67.3 % (ref 43.0–77.0)
Platelets: 344 10*3/uL (ref 150.0–400.0)
RBC: 4.74 Mil/uL (ref 3.87–5.11)
RDW: 14.5 % (ref 11.5–15.5)
WBC: 11.1 10*3/uL — ABNORMAL HIGH (ref 4.0–10.5)

## 2018-08-05 LAB — TSH: TSH: 3.77 u[IU]/mL (ref 0.35–4.50)

## 2018-08-05 LAB — VITAMIN D 25 HYDROXY (VIT D DEFICIENCY, FRACTURES): VITD: 19.63 ng/mL — ABNORMAL LOW (ref 30.00–100.00)

## 2018-08-05 LAB — LIPID PANEL
Cholesterol: 160 mg/dL (ref 0–200)
HDL: 43.5 mg/dL (ref >39.00)
LDL Cholesterol: 85 mg/dL (ref 0–99)
NonHDL: 116.76
Total CHOL/HDL Ratio: 4
Triglycerides: 158 mg/dL — ABNORMAL HIGH (ref 0.0–149.0)
VLDL: 31.6 mg/dL (ref 0.0–40.0)

## 2018-08-05 LAB — T4, FREE: Free T4: 0.8 ng/dL (ref 0.60–1.60)

## 2018-08-05 LAB — VITAMIN B12: Vitamin B-12: >1515 pg/mL — ABNORMAL HIGH (ref 211–911)

## 2018-08-05 MED ORDER — CYANOCOBALAMIN 1000 MCG/ML IJ SOLN
1000.0000 ug | Freq: Once | INTRAMUSCULAR | Status: AC
  Administered 2018-08-05: 09:00:00 1000 ug via INTRAMUSCULAR

## 2018-08-05 NOTE — Addendum Note (Signed)
Addended by: Young Berry T on: 08/05/2018 08:57 AM   Modules accepted: Orders

## 2018-08-05 NOTE — Progress Notes (Addendum)
Pt presented to the office today Per orders of Jarold Motto, [place], injection of Cyanocobalamin 1000 mcg IM given by Corky Mull LPN in left deltoid. Patient tolerated injection well. Patient will make appointment as needed.   I agree with the above assessment and plan.  Jarold Motto PA-C

## 2018-08-08 ENCOUNTER — Encounter: Payer: Self-pay | Admitting: Family Medicine

## 2018-08-08 DIAGNOSIS — F4321 Adjustment disorder with depressed mood: Secondary | ICD-10-CM

## 2018-08-09 MED ORDER — FLUOXETINE HCL 20 MG PO CAPS: ORAL_CAPSULE | ORAL | 1 refills | Status: DC

## 2018-08-28 ENCOUNTER — Other Ambulatory Visit: Payer: Self-pay | Admitting: Family Medicine

## 2018-08-28 DIAGNOSIS — F5104 Psychophysiologic insomnia: Secondary | ICD-10-CM

## 2018-09-29 ENCOUNTER — Other Ambulatory Visit: Payer: Self-pay | Admitting: Family Medicine

## 2018-09-29 DIAGNOSIS — F4321 Adjustment disorder with depressed mood: Secondary | ICD-10-CM

## 2018-10-27 ENCOUNTER — Other Ambulatory Visit: Payer: Self-pay | Admitting: Family Medicine

## 2018-10-28 NOTE — Telephone Encounter (Signed)
Last fill 08/02/18  #30/2 Last OV  08/05/18

## 2018-11-29 ENCOUNTER — Other Ambulatory Visit: Payer: Self-pay | Admitting: Family Medicine

## 2018-11-29 DIAGNOSIS — F4321 Adjustment disorder with depressed mood: Secondary | ICD-10-CM

## 2018-12-22 IMAGING — US US PELVIS COMPLETE
1 series · 13 of 25 positions shown · non-contrast
Comparison: 05/19/2016 CT

CLINICAL DATA: Left lower quadrant pain x2 days. Possible
left-sided hydrosalpinx per same day CT.

EXAM:
TRANSABDOMINAL AND TRANSVAGINAL ULTRASOUND OF PELVIS
DOPPLER ULTRASOUND OF OVARIES
TECHNIQUE: Both transabdominal and transvaginal ultrasound examinations of the
pelvis were performed. Transabdominal technique was performed for
global imaging of the pelvis including uterus, ovaries, adnexal
regions, and pelvic cul-de-sac.
It was necessary to proceed with endovaginal exam following the
transabdominal exam to visualize the ovaries. Color and duplex
Doppler ultrasound was utilized to evaluate blood flow to the
ovaries.

[Series 1: us pelvis complete · 0.25mm/px · 13 of 108 slices shown]
[im 1/108]
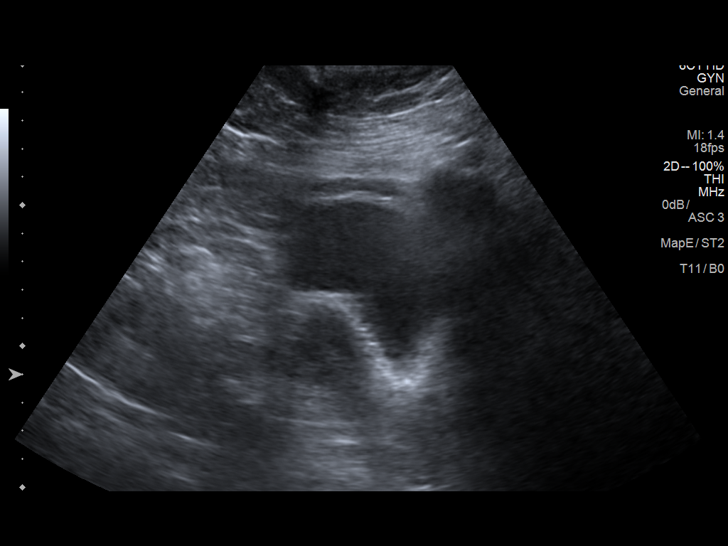
[im 9/108]
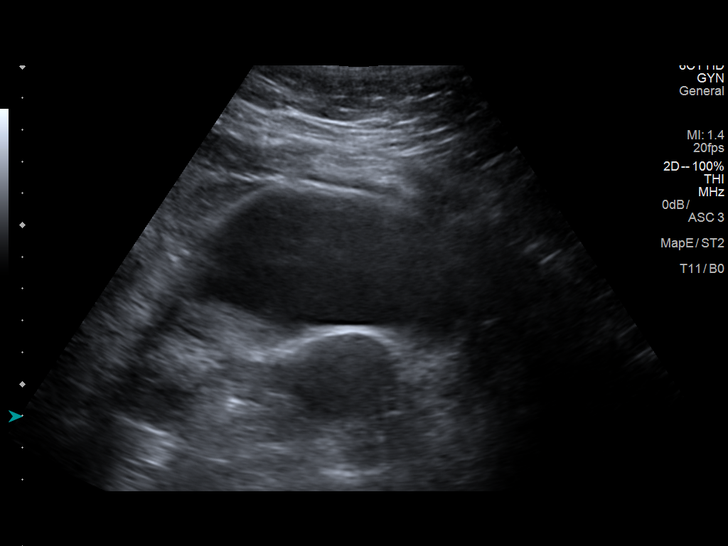
[im 18/108]
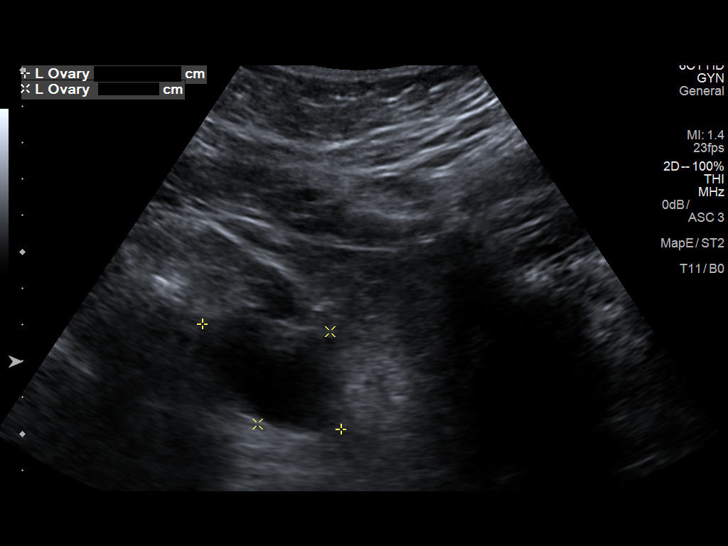
[im 27/108]
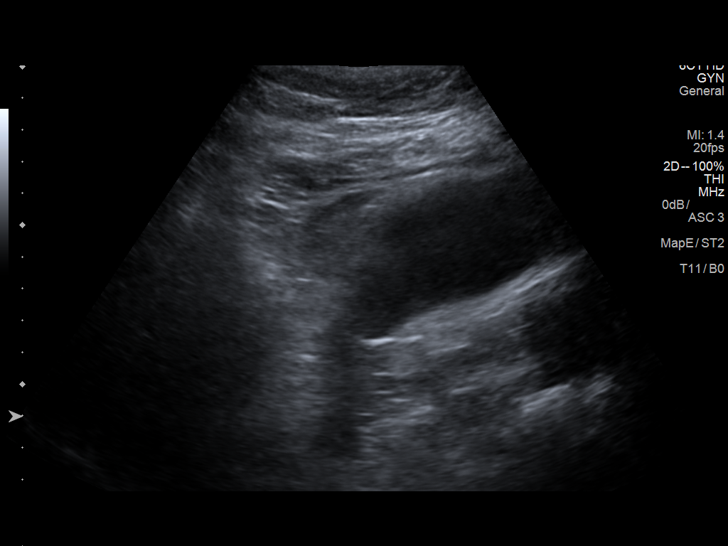
[im 36/108]
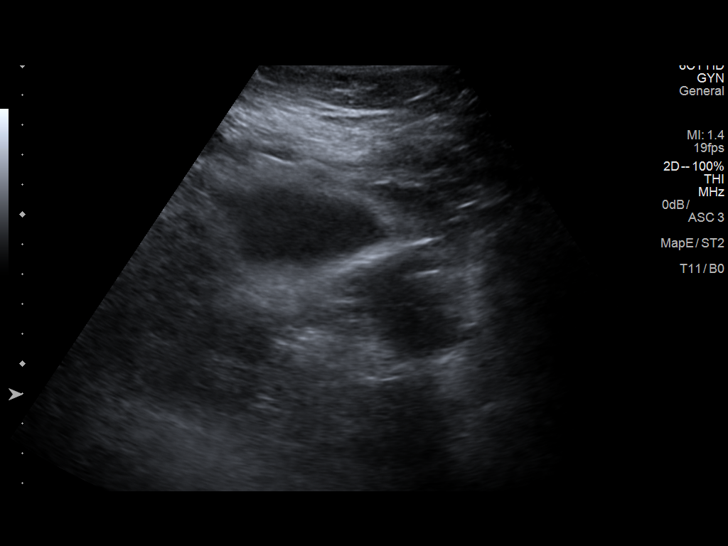
[im 45/108]
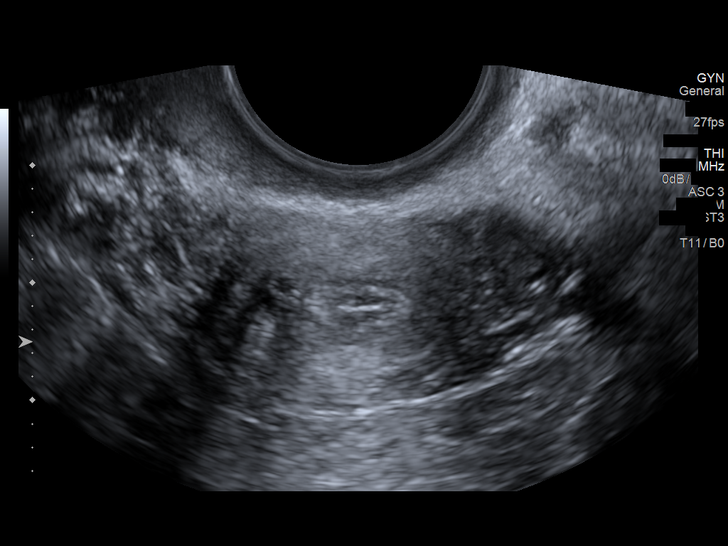
[im 54/108]
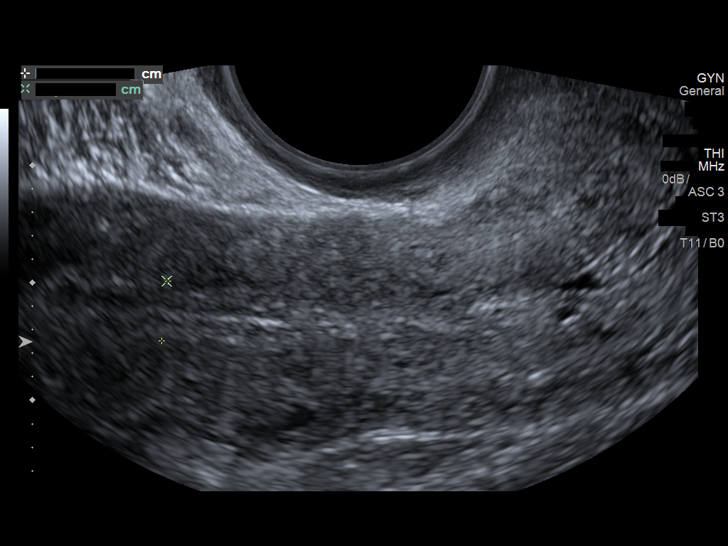
[im 63/108]
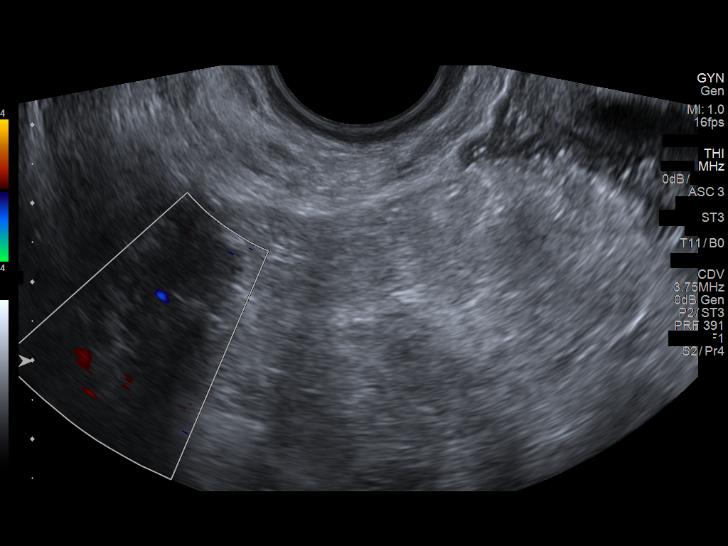
[im 72/108]
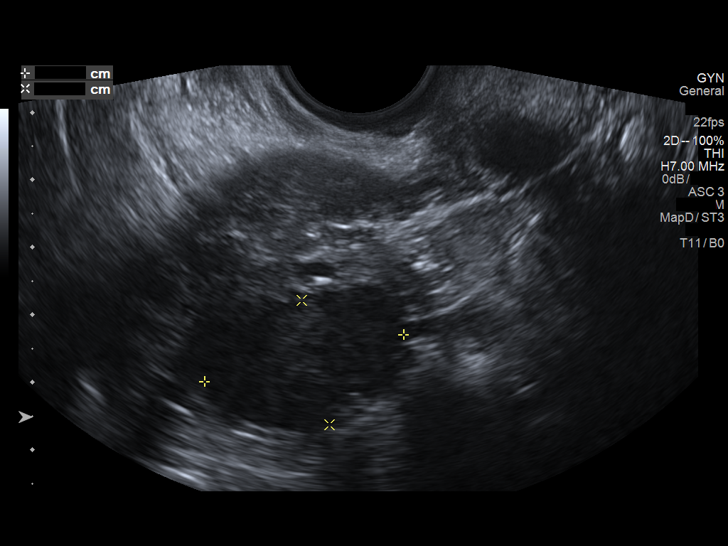
[im 81/108]
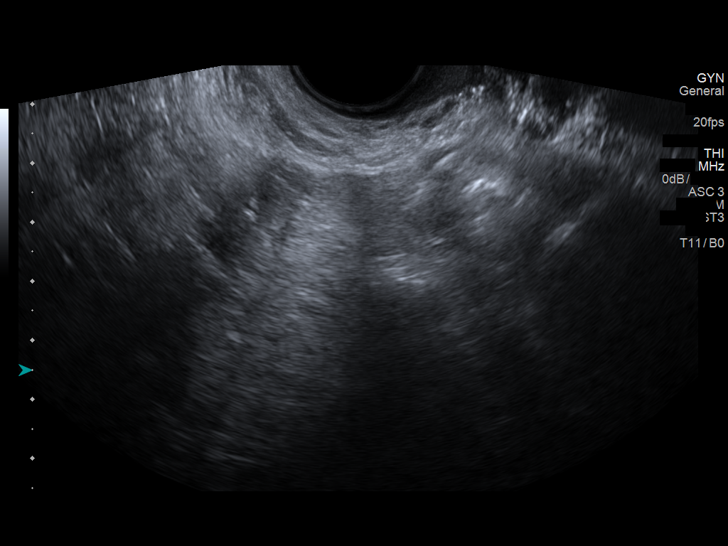
[im 90/108]
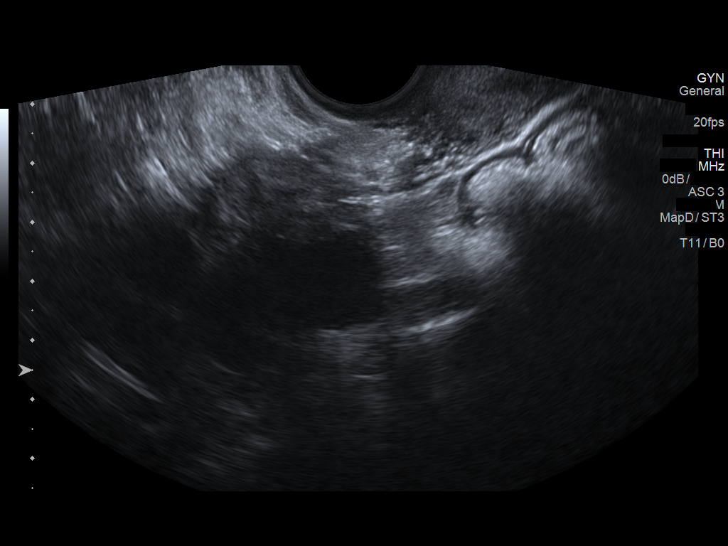
[im 99/108]
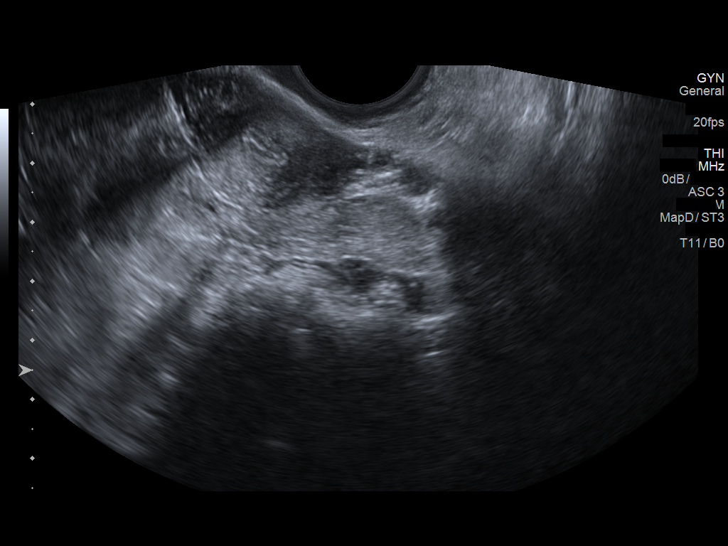
[im 108/108]
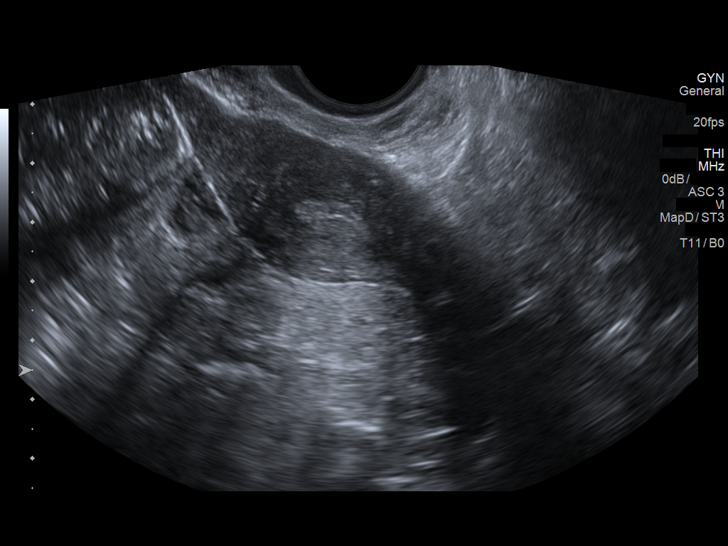

[13 of 25 positions shown; findings below may reference images not displayed]

FINDINGS: Uterus

Measurements: 7.3 x 2.7 x 4.1 cm. No fibroids or other mass
visualized. Sliver fluid in the endocervical canal.

Endometrium

Thickness: 5.1 mm.  No focal abnormality visualized.

Right ovary

Measurements: 2.8 x 2.7 x 1.8 cm. Normal appearance/no adnexal mass.

Left ovary

Measurements: 4.1 x 2.9 x 3.1 cm with homogeneous hypoechoic complex
cyst possibly an endometrioma measuring 3 x 2 x 1.9 cm. Normal
appearance/no adnexal mass.

Pulsed Doppler evaluation of both ovaries demonstrates normal
low-resistance arterial and venous waveforms.

Other findings

No hydrosalpinx identified.
IMPRESSION: 1. Complex left ovarian cyst measuring 3 x 2 x 1.9 cm possibly
representing an endometrioma given its homogeneous hypoechoic
appearance. Follow-up ultrasound in 6-12 weeks recommended per
Management of Asymptomatic Ovarian and Other Adnexal Cysts Imaged at
US: Society of Radiologists in Ultrasound Consensus Conference
Statement, December 2008.
2. No findings of ovarian torsion.  No hydrosalpinx.

## 2019-01-27 ENCOUNTER — Other Ambulatory Visit: Payer: Self-pay | Admitting: Family Medicine

## 2019-01-27 DIAGNOSIS — F4321 Adjustment disorder with depressed mood: Secondary | ICD-10-CM

## 2019-02-19 ENCOUNTER — Other Ambulatory Visit: Payer: Self-pay | Admitting: Family Medicine

## 2019-02-19 DIAGNOSIS — N83202 Unspecified ovarian cyst, left side: Secondary | ICD-10-CM

## 2019-02-23 ENCOUNTER — Other Ambulatory Visit: Payer: Self-pay | Admitting: Family Medicine

## 2019-02-23 DIAGNOSIS — F4321 Adjustment disorder with depressed mood: Secondary | ICD-10-CM

## 2019-02-24 ENCOUNTER — Encounter (INDEPENDENT_AMBULATORY_CARE_PROVIDER_SITE_OTHER): Payer: Self-pay | Admitting: Family Medicine

## 2019-02-24 DIAGNOSIS — F4321 Adjustment disorder with depressed mood: Secondary | ICD-10-CM

## 2019-02-24 DIAGNOSIS — N83202 Unspecified ovarian cyst, left side: Secondary | ICD-10-CM

## 2019-02-24 NOTE — Telephone Encounter (Signed)
Please review

## 2019-02-24 NOTE — Telephone Encounter (Signed)
Pt needs appointment to establish with new PCP, please call to schedule. Thanks!

## 2019-02-24 NOTE — Telephone Encounter (Signed)
Left a voicemail for the patient to call to schedule a TOC appointment for medication refill. No need to route note, for documentation purposes only.

## 2019-02-28 MED ORDER — NORETHINDRONE ACET-ETHINYL EST 1.5-30 MG-MCG PO TABS: 1.0000 | ORAL_TABLET | Freq: Every day | ORAL | 0 refills | Status: DC

## 2019-02-28 MED ORDER — LEVOTHYROXINE SODIUM 50 MCG PO TABS: 50.0000 ug | ORAL_TABLET | Freq: Every day | ORAL | 0 refills | Status: DC

## 2019-02-28 MED ORDER — FLUOXETINE HCL 20 MG PO CAPS: 20.0000 mg | ORAL_CAPSULE | Freq: Every day | ORAL | 0 refills | Status: DC

## 2019-03-08 ENCOUNTER — Other Ambulatory Visit: Payer: Self-pay

## 2019-03-09 ENCOUNTER — Ambulatory Visit: Payer: Managed Care, Other (non HMO) | Admitting: Family Medicine

## 2019-03-09 ENCOUNTER — Other Ambulatory Visit: Payer: Self-pay

## 2019-03-09 ENCOUNTER — Encounter: Payer: Self-pay | Admitting: Family Medicine

## 2019-03-09 VITALS — BP 128/80 | HR 84 | Temp 96.4°F | Ht 67.0 in | Wt 272.1 lb

## 2019-03-09 DIAGNOSIS — N83202 Unspecified ovarian cyst, left side: Secondary | ICD-10-CM | POA: Diagnosis not present

## 2019-03-09 DIAGNOSIS — D510 Vitamin B12 deficiency anemia due to intrinsic factor deficiency: Secondary | ICD-10-CM | POA: Diagnosis not present

## 2019-03-09 DIAGNOSIS — J385 Laryngeal spasm: Secondary | ICD-10-CM

## 2019-03-09 DIAGNOSIS — R55 Syncope and collapse: Secondary | ICD-10-CM

## 2019-03-09 DIAGNOSIS — E559 Vitamin D deficiency, unspecified: Secondary | ICD-10-CM | POA: Diagnosis not present

## 2019-03-09 DIAGNOSIS — E039 Hypothyroidism, unspecified: Secondary | ICD-10-CM

## 2019-03-09 MED ORDER — NORETHINDRONE ACET-ETHINYL EST 1.5-30 MG-MCG PO TABS: 1.0000 | ORAL_TABLET | Freq: Every day | ORAL | 4 refills | Status: DC

## 2019-03-09 MED ORDER — FLUOXETINE HCL 20 MG PO CAPS: 20.0000 mg | ORAL_CAPSULE | Freq: Every day | ORAL | 3 refills | Status: DC

## 2019-03-09 MED ORDER — MIDODRINE HCL 5 MG PO TABS: 5.0000 mg | ORAL_TABLET | Freq: Three times a day (TID) | ORAL | 1 refills | Status: DC | PRN

## 2019-03-09 MED ORDER — LEVOTHYROXINE SODIUM 50 MCG PO TABS: 50.0000 ug | ORAL_TABLET | Freq: Every day | ORAL | 3 refills | Status: DC

## 2019-03-09 NOTE — Progress Notes (Signed)
Chief Complaint  Patient presents with  . New Patient (Initial Visit)    Subjective: Patient is a 43 y.o. female here for TOC.  Hypothyroidism Patient presents for follow-up of hypothyroidism.  Reports compliance with medication- Synthroid 50 mcg.  Current symptoms include: inability to lose weigh Denies: denies fatigue, heat/cold intolerance, bowel/skin changes or CVS symptoms  Patient has a history of low blood pressure and passing out.  She is in a combination of fluoxetine 20 mg daily in addition to midodrine as needed.  She is not needed the midodrine over a year, however she does need a refill due to her current batch expiring.  Patient has a history of pernicious anemia.  She did receive vitamin B12 shots in the past but in April her levels were elevated and they were thus stopped.  She has a history of vitamin D deficiency.  Prescription vitamin D is quite expensive with her insurance plan.  She has been taking 5000 units over-the-counter daily.  ROS: Const: No fevers Msk: +L wrist pain  Past Medical History:  Diagnosis Date  . Anxiety   . Cardiac arrhythmia   . Chronic headaches   . GERD (gastroesophageal reflux disease)   . Hypothyroidism 01/17/2016   Runs in family  . Left ovarian cyst 03/16/2014   Overview:  02/2014 2.3 x 2.1 03/15/14 4 x 4.1 septated labs pending started on OCP's  . Migraine without aura and without status migrainosus, not intractable   . Obstructive sleep apnea syndrome 11/27/2016  . Pernicious anemia   . Vasodepressor syncope   . 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 No accessory muscle use Psych: Age appropriate judgment and insight, normal affect and mood  Assessment and Plan: Hypothyroidism, unspecified type - Plan: levothyroxine (SYNTHROID) 50 MCG tablet  Left ovarian cyst - Plan: Norethindrone Acetate-Ethinyl Estradiol (JUNEL 1.5/30) 1.5-30 MG-MCG tablet  Vitamin D deficiency - Plan: Vitamin D (25 hydroxy)  Pernicious anemia - Plan: CBC, B12  Vasodepressor syncope - Plan: FLUoxetine (PROZAC) 20 MG capsule, midodrine (PROAMATINE) 5 MG tablet  Spasm of vocal cords - Plan: omeprazole (PRILOSEC) 20 MG capsule  Check labs for vitamin D and pernicious anemia.  Continue vitamin D supplementation. Continue chronic medications. Follow-up in 6 months for physical or as needed. The patient voiced understanding and agreement to the plan.  [place], DO 03/09/19  4:26 PM

## 2019-03-09 NOTE — Patient Instructions (Signed)
Give [place] 2-3 business days to get the results of your labs back.   Let me know if you need refills.  Good luck with your surgery!  Let us know if you need anything.

## 2019-03-10 LAB — CBC
HCT: 40.3 % (ref 36.0–46.0)
Hemoglobin: 13.2 g/dL (ref 12.0–15.0)
MCHC: 32.8 g/dL (ref 30.0–36.0)
MCV: 84.9 fl (ref 78.0–100.0)
Platelets: 399 10*3/uL (ref 150.0–400.0)
RBC: 4.74 Mil/uL (ref 3.87–5.11)
RDW: 14.8 % (ref 11.5–15.5)
WBC: 15.4 10*3/uL — ABNORMAL HIGH (ref 4.0–10.5)

## 2019-03-10 LAB — VITAMIN B12: Vitamin B-12: >1500 pg/mL — ABNORMAL HIGH (ref 211–911)

## 2019-03-10 LAB — VITAMIN D 25 HYDROXY (VIT D DEFICIENCY, FRACTURES): VITD: 33.63 ng/mL (ref 30.00–100.00)

## 2019-03-14 ENCOUNTER — Other Ambulatory Visit: Payer: Self-pay | Admitting: Family Medicine

## 2019-03-14 DIAGNOSIS — R7989 Other specified abnormal findings of blood chemistry: Secondary | ICD-10-CM

## 2019-03-14 DIAGNOSIS — R748 Abnormal levels of other serum enzymes: Secondary | ICD-10-CM

## 2019-03-14 DIAGNOSIS — D510 Vitamin B12 deficiency anemia due to intrinsic factor deficiency: Secondary | ICD-10-CM

## 2019-03-28 ENCOUNTER — Other Ambulatory Visit (INDEPENDENT_AMBULATORY_CARE_PROVIDER_SITE_OTHER): Payer: Self-pay | Admitting: Family Medicine

## 2019-03-28 DIAGNOSIS — R55 Syncope and collapse: Secondary | ICD-10-CM

## 2019-03-28 DIAGNOSIS — E039 Hypothyroidism, unspecified: Secondary | ICD-10-CM

## 2019-04-11 ENCOUNTER — Other Ambulatory Visit (INDEPENDENT_AMBULATORY_CARE_PROVIDER_SITE_OTHER): Payer: Self-pay | Admitting: Family Medicine

## 2019-04-11 DIAGNOSIS — E039 Hypothyroidism, unspecified: Secondary | ICD-10-CM

## 2020-03-11 ENCOUNTER — Other Ambulatory Visit: Payer: Self-pay | Admitting: Family Medicine

## 2020-03-11 DIAGNOSIS — N83202 Unspecified ovarian cyst, left side: Secondary | ICD-10-CM

## 2020-05-14 ENCOUNTER — Other Ambulatory Visit: Payer: Self-pay | Admitting: Family Medicine

## 2020-05-14 DIAGNOSIS — E039 Hypothyroidism, unspecified: Secondary | ICD-10-CM

## 2020-05-14 DIAGNOSIS — R55 Syncope and collapse: Secondary | ICD-10-CM

## 2020-08-15 ENCOUNTER — Telehealth: Payer: Managed Care, Other (non HMO) | Admitting: Family Medicine

## 2020-08-15 ENCOUNTER — Encounter: Payer: Self-pay | Admitting: Family Medicine

## 2020-08-15 ENCOUNTER — Other Ambulatory Visit: Payer: Self-pay

## 2020-08-15 DIAGNOSIS — D510 Vitamin B12 deficiency anemia due to intrinsic factor deficiency: Secondary | ICD-10-CM | POA: Diagnosis not present

## 2020-08-15 DIAGNOSIS — R5383 Other fatigue: Secondary | ICD-10-CM

## 2020-08-15 DIAGNOSIS — F411 Generalized anxiety disorder: Secondary | ICD-10-CM | POA: Diagnosis not present

## 2020-08-15 DIAGNOSIS — N83202 Unspecified ovarian cyst, left side: Secondary | ICD-10-CM

## 2020-08-15 DIAGNOSIS — E039 Hypothyroidism, unspecified: Secondary | ICD-10-CM

## 2020-08-15 DIAGNOSIS — R55 Syncope and collapse: Secondary | ICD-10-CM

## 2020-08-15 MED ORDER — FLUOXETINE HCL 20 MG PO CAPS: 20.0000 mg | ORAL_CAPSULE | Freq: Every day | ORAL | 2 refills | Status: DC

## 2020-08-15 MED ORDER — LEVOTHYROXINE SODIUM 50 MCG PO TABS: 50.0000 ug | ORAL_TABLET | Freq: Every day | ORAL | 2 refills | Status: DC

## 2020-08-15 NOTE — Progress Notes (Signed)
Chief Complaint  Patient presents with  . Follow-up    Subjective Debra West presents for f/u anxiety. Due to COVID-19 pandemic, we are interacting via web portal for an electronic face-to-face visit. I verified patient's ID using 2 identifiers. Patient agreed to proceed with visit via this method. Patient is in a parked car, I am at home. Patient and I are present for visit.    Pt is currently being treated with Prozac 20 mg/d.  Reports doing well since treatment. Has had increased stressors in life lately.  No thoughts of harming self or others. No self-medication with alcohol, prescription drugs or illicit drugs. Pt is not following with a counselor/psychologist.  Hypothyroidism Patient presents for follow-up of hypothyroidism.  Reports compliance with medication- levothyroxine 50 mcg/d.  Current symptoms include: fatigue; wears mouth appliance for OSA, walks and stays active around house, diet is fair, stress has affected her mood a little, but things wrapped up in one scenario with her L wrist.  Denies weight changes, heat/cold intolerance, bowel/skin changes or CVS symptoms She believes her dose should be not significantly changed  Past Medical History:  Diagnosis Date  . Anxiety   . Cardiac arrhythmia   . Chronic headaches   . GERD (gastroesophageal reflux disease)   . Hypothyroidism 01/17/2016   Runs in family  . Left ovarian cyst 03/16/2014   Overview:  02/2014 2.3 x 2.1 03/15/14 4 x 4.1 septated labs pending started on OCP's  . Migraine without aura and without status migrainosus, not intractable   . Obstructive sleep apnea syndrome 11/27/2016   wears mouth guard  . Pernicious anemia   . Vasodepressor syncope   . 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 If you  have any questions, ask your nurse or doctor.        STOP taking these medications   beclomethasone 80 MCG/ACT inhaler Commonly known as: Special educational needs teacher Stopped by: Sharlene Dory, DO   cyclobenzaprine 5 MG tablet Commonly known as: FLEXERIL Stopped by: Sharlene Dory, DO     TAKE these medications   celecoxib 100 MG capsule Commonly known as: CeleBREX Take 1 capsule (100 mg total) by mouth 2 (two) times daily as needed.   FLUoxetine 20 MG capsule Commonly known as: PROZAC Take 1 capsule (20 mg total) by mouth daily. What changed: See the new instructions. Changed by: Sharlene Dory, DO   [name] 1.5/30 1.5-30 MG-MCG tablet Generic drug: Norethindrone Acetate-Ethinyl Estradiol TAKE 1 TABLET DAILY   ibuprofen 200 MG tablet Commonly known as: ADVIL Take 2-3 tablets (400-600 mg total) by mouth every 8 (eight) hours as needed for fever, headache, mild pain, moderate pain or cramping.   levothyroxine 50 MCG tablet Commonly known as: SYNTHROID Take 1 tablet (50 mcg total) by mouth daily.   midodrine 5 MG tablet Commonly known as: PROAMATINE Take 1 tablet (5 mg total) by mouth 3 (three) times daily as needed (for low blood pressure (less than 105)).   omeprazole 20 MG capsule Commonly known as: PRILOSEC Take 1 capsule (20 mg total) by mouth daily.   traZODone 50 MG tablet Commonly known as: DESYREL TAKE 1 TABLET BY MOUTH EVERYDAY AT BEDTIME   vitamin B-12 1000 MCG tablet  Commonly known as: CYANOCOBALAMIN Take 1,000 mcg by mouth daily.       Exam No conversational dyspnea Age appropriate judgment and insight Nml affect and mood  Assessment and Plan  Hypothyroidism, unspecified type - Plan: levothyroxine (SYNTHROID) 50 MCG tablet, TSH, T4, free  Pernicious anemia - Plan: B12  Fatigue, unspecified type - Plan: CBC, Comprehensive metabolic panel, VITAMIN D 25 Hydroxy (Vit-D Deficiency, Fractures)  GAD (generalized anxiety disorder) -  Plan: FLUoxetine (PROZAC) 20 MG capsule  Left ovarian cyst  Vasodepressor syncope  Morbid obesity due to excess calories (HCC)  1. Cont levothyroxine 50 mcg/d, ck labs. 2. Ck B12.  3. Ck above labs. She is compliant w her mouth guard. Counseled on diet/exercise. Could consider changing dosage of Prozac if labs neg.  4. Cont Prozac 20 mg/d for now.  F/u for labs at convenience, OV pending results. The patient voiced understanding and agreement to the plan.  Debra Roche [place], DO 08/15/20 7:59 AM

## 2020-08-22 ENCOUNTER — Other Ambulatory Visit: Payer: Self-pay

## 2020-08-22 ENCOUNTER — Other Ambulatory Visit (INDEPENDENT_AMBULATORY_CARE_PROVIDER_SITE_OTHER): Payer: Managed Care, Other (non HMO)

## 2020-08-22 DIAGNOSIS — R5383 Other fatigue: Secondary | ICD-10-CM

## 2020-08-22 DIAGNOSIS — D510 Vitamin B12 deficiency anemia due to intrinsic factor deficiency: Secondary | ICD-10-CM | POA: Diagnosis not present

## 2020-08-22 DIAGNOSIS — E039 Hypothyroidism, unspecified: Secondary | ICD-10-CM

## 2020-08-22 LAB — COMPREHENSIVE METABOLIC PANEL
ALT: 32 U/L (ref 0–35)
AST: 50 U/L — ABNORMAL HIGH (ref 0–37)
Albumin: 4.2 g/dL (ref 3.5–5.2)
Alkaline Phosphatase: 54 U/L (ref 39–117)
BUN: 8 mg/dL (ref 6–23)
CO2: 26 mEq/L (ref 19–32)
Calcium: 9.3 mg/dL (ref 8.4–10.5)
Chloride: 96 mEq/L (ref 96–112)
Creatinine, Ser: 0.55 mg/dL (ref 0.40–1.20)
GFR: 110.26 mL/min (ref >60.00)
Glucose, Bld: 201 mg/dL — ABNORMAL HIGH (ref 70–99)
Potassium: 3.9 mEq/L (ref 3.5–5.1)
Sodium: 137 mEq/L (ref 135–145)
Total Bilirubin: 0.4 mg/dL (ref 0.2–1.2)
Total Protein: 7 g/dL (ref 6.0–8.3)

## 2020-08-22 LAB — CBC
HCT: 40.4 % (ref 36.0–46.0)
Hemoglobin: 13.2 g/dL (ref 12.0–15.0)
MCHC: 32.6 g/dL (ref 30.0–36.0)
MCV: 83.5 fl (ref 78.0–100.0)
Platelets: 348 10*3/uL (ref 150.0–400.0)
RBC: 4.84 Mil/uL (ref 3.87–5.11)
RDW: 14.7 % (ref 11.5–15.5)
WBC: 11 10*3/uL — ABNORMAL HIGH (ref 4.0–10.5)

## 2020-08-22 LAB — TSH: TSH: 3.76 u[IU]/mL (ref 0.35–4.50)

## 2020-08-22 LAB — VITAMIN B12: Vitamin B-12: >1550 pg/mL — ABNORMAL HIGH (ref 211–911)

## 2020-08-22 LAB — T4, FREE: Free T4: 0.89 ng/dL (ref 0.60–1.60)

## 2020-08-22 LAB — VITAMIN D 25 HYDROXY (VIT D DEFICIENCY, FRACTURES): VITD: 32.29 ng/mL (ref 30.00–100.00)

## 2020-08-23 ENCOUNTER — Other Ambulatory Visit (INDEPENDENT_AMBULATORY_CARE_PROVIDER_SITE_OTHER): Payer: Managed Care, Other (non HMO)

## 2020-08-23 ENCOUNTER — Other Ambulatory Visit: Payer: Managed Care, Other (non HMO)

## 2020-08-23 DIAGNOSIS — R739 Hyperglycemia, unspecified: Secondary | ICD-10-CM

## 2020-08-23 LAB — HEMOGLOBIN A1C: Hgb A1c MFr Bld: 10.2 % — ABNORMAL HIGH (ref 4.6–6.5)

## 2020-08-27 ENCOUNTER — Encounter: Payer: Self-pay | Admitting: Family Medicine

## 2020-08-27 ENCOUNTER — Telehealth (INDEPENDENT_AMBULATORY_CARE_PROVIDER_SITE_OTHER): Payer: Managed Care, Other (non HMO) | Admitting: Family Medicine

## 2020-08-27 ENCOUNTER — Other Ambulatory Visit: Payer: Self-pay

## 2020-08-27 DIAGNOSIS — E1165 Type 2 diabetes mellitus with hyperglycemia: Secondary | ICD-10-CM | POA: Insufficient documentation

## 2020-08-27 MED ORDER — METFORMIN HCL 500 MG PO TABS: ORAL_TABLET | ORAL | 0 refills | Status: DC

## 2020-08-27 MED ORDER — ROSUVASTATIN CALCIUM 20 MG PO TABS: 20.0000 mg | ORAL_TABLET | Freq: Every day | ORAL | 3 refills | Status: DC

## 2020-08-27 MED ORDER — METFORMIN HCL 1000 MG PO TABS: 1000.0000 mg | ORAL_TABLET | Freq: Two times a day (BID) | ORAL | 0 refills | Status: DC

## 2020-08-27 NOTE — Progress Notes (Signed)
Chief Complaint  Patient presents with  . Follow-up    Subjective: Patient is a 45 y.o. female here for lab follow up. Due to COVID-19 pandemic, we are interacting via web portal for an electronic face-to-face visit. I verified patient's ID using 2 identifiers. Patient agreed to proceed with visit via this method. Patient is at home, I am at office. Patient and I are present for visit.   Patient is A1c is 10.2.  She has no personal or significant family history of diabetes.  She is not on a statin.  She does follow with an eye provider every January.  She has never received a pneumonia vaccine.  She does not have a blood glucose monitor.  Diet is fair.  She does walk and stay active in her yard/house.  Past Medical History:  Diagnosis Date  . Anxiety   . Cardiac arrhythmia   . Chronic headaches   . GERD (gastroesophageal reflux disease)   . Hypothyroidism 01/17/2016   Runs in family  . Left ovarian cyst 03/16/2014   Overview:  02/2014 2.3 x 2.1 03/15/14 4 x 4.1 septated labs pending started on OCP's  . Migraine without aura and without status migrainosus, not intractable   . Obstructive sleep apnea syndrome 11/27/2016   wears mouth guard  . Pernicious anemia   . Vasodepressor syncope   . 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  Start metformin 500 mg and increase at same increment weekly until taking 1000 mg twice daily.  Start Crestor 20 mg daily.  Continue with eye provider yearly.  Will need pneumonia vaccination, foot exam, and urine microalbumin creatinine ratio and next appointment in 6 weeks.  She will contact her insurance  company for glucometer recommendations and we will send it in. The patient voiced understanding and agreement to the plan.  Jilda Roche [place], DO 08/27/20  3:42 PM

## 2020-08-31 ENCOUNTER — Other Ambulatory Visit: Payer: Self-pay | Admitting: Dentistry

## 2020-08-31 DIAGNOSIS — M26633 Articular disc disorder of bilateral temporomandibular joint: Secondary | ICD-10-CM

## 2020-09-09 ENCOUNTER — Ambulatory Visit
Admission: RE | Admit: 2020-09-09 | Discharge: 2020-09-09 | Disposition: A | Discharge status: Home / Self Care | Payer: No Typology Code available for payment source | Source: Ambulatory Visit | Attending: Dentistry | Admitting: Dentistry

## 2020-09-09 DIAGNOSIS — M26633 Articular disc disorder of bilateral temporomandibular joint: Secondary | ICD-10-CM

## 2020-09-15 ENCOUNTER — Other Ambulatory Visit: Payer: Self-pay

## 2020-09-15 ENCOUNTER — Encounter (HOSPITAL_BASED_OUTPATIENT_CLINIC_OR_DEPARTMENT_OTHER): Payer: Self-pay | Admitting: Emergency Medicine

## 2020-09-15 ENCOUNTER — Emergency Department (HOSPITAL_BASED_OUTPATIENT_CLINIC_OR_DEPARTMENT_OTHER)
Admission: EM | Admit: 2020-09-15 | Discharge: 2020-09-16 | Disposition: A | Discharge status: Home / Self Care | Payer: Managed Care, Other (non HMO) | Attending: Emergency Medicine | Admitting: Emergency Medicine

## 2020-09-15 DIAGNOSIS — R55 Syncope and collapse: Secondary | ICD-10-CM | POA: Diagnosis not present

## 2020-09-15 DIAGNOSIS — R109 Unspecified abdominal pain: Secondary | ICD-10-CM | POA: Insufficient documentation

## 2020-09-15 DIAGNOSIS — R42 Dizziness and giddiness: Secondary | ICD-10-CM | POA: Insufficient documentation

## 2020-09-15 DIAGNOSIS — E119 Type 2 diabetes mellitus without complications: Secondary | ICD-10-CM | POA: Diagnosis not present

## 2020-09-15 DIAGNOSIS — Z79899 Other long term (current) drug therapy: Secondary | ICD-10-CM | POA: Diagnosis not present

## 2020-09-15 DIAGNOSIS — Z7984 Long term (current) use of oral hypoglycemic drugs: Secondary | ICD-10-CM | POA: Diagnosis not present

## 2020-09-15 DIAGNOSIS — E860 Dehydration: Secondary | ICD-10-CM

## 2020-09-15 DIAGNOSIS — E039 Hypothyroidism, unspecified: Secondary | ICD-10-CM | POA: Diagnosis not present

## 2020-09-15 DIAGNOSIS — R110 Nausea: Secondary | ICD-10-CM | POA: Insufficient documentation

## 2020-09-15 HISTORY — DX: Type 2 diabetes mellitus without complications: E11.9

## 2020-09-15 LAB — CBG MONITORING, ED: Glucose-Capillary: 221 mg/dL — ABNORMAL HIGH (ref 70–99)

## 2020-09-15 LAB — CBC
HCT: 40 % (ref 36.0–46.0)
Hemoglobin: 13.1 g/dL (ref 12.0–15.0)
MCH: 27.8 pg (ref 26.0–34.0)
MCHC: 32.8 g/dL (ref 30.0–36.0)
MCV: 84.7 fL (ref 80.0–100.0)
Platelets: 351 10*3/uL (ref 150–400)
RBC: 4.72 MIL/uL (ref 3.87–5.11)
RDW: 13.6 % (ref 11.5–15.5)
WBC: 18.6 10*3/uL — ABNORMAL HIGH (ref 4.0–10.5)
nRBC: 0 % (ref 0.0–0.2)

## 2020-09-15 LAB — BASIC METABOLIC PANEL
Anion gap: 11 (ref 5–15)
BUN: 7 mg/dL (ref 6–20)
CO2: 24 mmol/L (ref 22–32)
Calcium: 9 mg/dL (ref 8.9–10.3)
Chloride: 99 mmol/L (ref 98–111)
Creatinine, Ser: 0.57 mg/dL (ref 0.44–1.00)
GFR, Estimated: >60 mL/min (ref >60)
Glucose, Bld: 209 mg/dL — ABNORMAL HIGH (ref 70–99)
Potassium: 3.2 mmol/L — ABNORMAL LOW (ref 3.5–5.1)
Sodium: 134 mmol/L — ABNORMAL LOW (ref 135–145)

## 2020-09-15 MED ORDER — SODIUM CHLORIDE 0.9 % IV SOLN
1000.0000 mL | INTRAVENOUS | Status: DC
  Administered 2020-09-16: 1000 mL via INTRAVENOUS

## 2020-09-15 MED ORDER — SODIUM CHLORIDE 0.9 % IV BOLUS (SEPSIS)
1000.0000 mL | Freq: Once | INTRAVENOUS | Status: AC
  Administered 2020-09-16: 1000 mL via INTRAVENOUS

## 2020-09-15 NOTE — ED Provider Notes (Addendum)
[facility] EMERGENCY DEPARTMENT Provider Note  CSN: [id number] Arrival date & time: 09/15/20 2203  Chief Complaint(s) Near Syncope  HPI Debra West is a 45 y.o. female with a past medical history listed below including recurrent syncope who presents to the emergency department after a near syncopal episode that occurred while she was getting a massage.  Patient reports that she had her typical prodromal features including headache, nausea, lightheadedness.  She also endorses having abdominal discomfort and needing to have a bowel movement.  She denied any associated chest pain or shortness of breath.  She denies any recent fevers or infections.  No coughing or congestion.  No focal deficits.  Patient came in by EMS and received IV fluids in route as well as antiemetic.  She reports that she was orthostatic when EMS initially assessed her.  After the IV fluids she reports feeling better.  She did report that the she was recently diagnosed with diabetes and started on metformin as well as a statin.   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   Patient Active Problem List   Diagnosis Date Noted   Type 2 diabetes mellitus with hyperglycemia, without long-term current use of insulin (HCC) 08/27/2020   GAD (generalized anxiety disorder) 08/15/2020   Vitamin D deficiency 08/04/2018   Psychophysiological insomnia  08/04/2018   Dyspepsia 08/04/2018   Atopic dermatitis 08/04/2018   Intermittent low back pain 08/04/2018   Ganglion cyst of volar aspect of left wrist 04/16/2018   Left wrist pain 04/16/2018   Obstructive sleep apnea syndrome 11/27/2016   Vocal fold paresis, 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 Authorizing Provider  celecoxib (CELEBREX) 100 MG capsule Take 1 capsule (100 mg total) by mouth 2 (two) times daily as needed. 04/15/18   Andrena Mews, DO  FLUoxetine (PROZAC) 20 MG capsule Take 1 capsule (20 mg total) by mouth daily. 08/15/20   Sharlene Dory, DO  [name] 1.5/30 1.5-30 MG-MCG tablet TAKE 1 TABLET DAILY 03/12/20   Carmelia Roller, Jilda Roche, DO  ibuprofen (ADVIL,MOTRIN) 200 MG tablet Take 2-3 tablets (400-600 mg total) by mouth every 8 (eight) hours as needed for fever, headache, mild pain, moderate pain or cramping. 05/14/16   Vassie Loll, MD  levothyroxine (SYNTHROID) 50 MCG tablet Take 1 tablet (50 mcg total) by mouth daily. 08/15/20   Sharlene Dory, DO  metFORMIN (GLUCOPHAGE) 1000 MG tablet Take 1 tablet (1,000 mg total) by mouth 2 (two) times daily with a meal. 09/26/20   Wendling, Jilda Roche, DO  metFORMIN (GLUCOPHAGE) 500 MG tablet Week 1: 1 tab daily Week 2: 1 tab twice daily Week 3: 2 tabs in AM, 1 in evening Week 4: 2 tabs twice daily 08/27/20   Sharlene Dory, DO  midodrine (PROAMATINE) 5 MG tablet Take 1 tablet  (5 mg total) by mouth 3 (three) times daily as needed (for low blood pressure (less than 105)). 03/09/19   Sharlene Dory, DO  omeprazole (PRILOSEC) 20 MG capsule Take 1 capsule (20 mg total) by mouth daily. 03/09/19   Sharlene Dory, DO  rosuvastatin (CRESTOR) 20 MG tablet Take 1 tablet (20 mg total) by mouth daily. 08/27/20   Sharlene Dory, DO  traZODone (DESYREL) 50 MG tablet TAKE 1 TABLET BY MOUTH EVERYDAY AT BEDTIME 08/31/18   Helane Rima, DO  vitamin B-12 (CYANOCOBALAMIN) 1000 MCG tablet Take 1,000 mcg by mouth daily.    [provider]                                                                                                                                    Past Surgical History Past Surgical History:  Procedure Laterality Date   CHOLECYSTECTOMY  2004   OVARIAN CYST REMOVAL Left    WISDOM TOOTH EXTRACTION     WRIST SURGERY     Family History Family History  Problem Relation Age of Onset   Thyroid disease Mother    Heart disease Father    Stroke Father    Heart attack Father    Thyroid disease Sister    Lung cancer Maternal Grandfather    Heart disease Maternal Grandfather    Diabetes Paternal Grandmother    Thyroid cancer Paternal Grandmother    Heart disease Paternal Grandfather    Breast cancer Paternal Aunt    Bladder Cancer Paternal Aunt    Thyroid disease Paternal Aunt    Cancer Paternal Aunt     Social History Social History   Tobacco Use   Smoking status: Never   Smokeless tobacco: Never  Vaping Use   Vaping Use: Never used  Substance Use Topics   Alcohol use: Yes    Comment: Occasionally   Drug use: No   Allergies Depo-medrol [methylprednisolone sodium succ] and Aspirin  Review of Systems Review of Systems  Cardiovascular:  Positive for near-syncope.  All other systems are reviewed and are negative for acute change except as noted in the HPI  Physical Exam Vital Signs  I have reviewed the triage vital  signs BP (!) 105/52   Pulse 88   Resp (!) 21   Ht  (1.702 m)   Wt 122.5 kg   SpO2 95%   BMI 42.29 kg/m   Physical Exam Vitals reviewed.  Constitutional:  General: She is not in acute distress.    Appearance: She is well-developed. She is not diaphoretic.  HENT:     Head: Normocephalic and atraumatic.     Nose: Nose normal.  Eyes:     General: No scleral icterus.       Right eye: No discharge.        Left eye: No discharge.     Conjunctiva/sclera: Conjunctivae normal.     Pupils: Pupils are equal, round, and reactive to light.  Cardiovascular:     Rate and Rhythm: Normal rate and regular rhythm.     Heart sounds: No murmur heard.   No friction rub. No gallop.  Pulmonary:     Effort: Pulmonary effort is normal. No respiratory distress.     Breath sounds: Normal breath sounds. No stridor. No rales.  Abdominal:     General: There is no distension.     Palpations: Abdomen is soft.     Tenderness: There is no abdominal tenderness.  Musculoskeletal:        General: No tenderness.     Cervical back: Normal range of motion and neck supple.  Skin:    General: Skin is warm and dry.     Coloration: Pallor: .Marland Kitchen     Findings: No erythema or rash.  Neurological:     Mental Status: She is alert and oriented to person, place, and time.    ED Results and Treatments Labs (all labs ordered are listed, but only abnormal results are displayed) Labs Reviewed  BASIC METABOLIC PANEL - Abnormal; Notable for the following components:      Result Value   Sodium 134 (*)    Potassium 3.2 (*)    Glucose, Bld 209 (*)    All other components within normal limits  CBC - Abnormal; Notable for the following components:   WBC 18.6 (*)    All other components within normal limits  URINALYSIS, ROUTINE W REFLEX MICROSCOPIC - Abnormal; Notable for the following components:   Specific Gravity, Urine >1.030 (*)    Bilirubin Urine SMALL (*)    Ketones, ur 15 (*)    All other components  within normal limits  CBG MONITORING, ED - Abnormal; Notable for the following components:   Glucose-Capillary 221 (*)    All other components within normal limits  HCG, QUANTITATIVE, PREGNANCY  CBG MONITORING, ED                                                                                                                         EKG  EKG Interpretation  Date/Time:  Saturday September 15 2020 22:07:04 EDT Ventricular Rate:  85 PR Interval:  98 QRS Duration: 89 QT Interval:  384 QTC Calculation: 457 R Axis:   76 Text Interpretation: Sinus or ectopic atrial rhythm Short PR interval Low voltage, precordial leads No significant change since last tracing Confirmed by Drema Pry ([id number]) on 09/16/2020 12:41:28 AM  Radiology No results found.  Pertinent labs & imaging results that were available during my care of the patient were reviewed by me and considered in my medical decision making (see chart for details).  Medications Ordered in ED Medications  sodium chloride 0.9 % bolus 1,000 mL (0 mLs Intravenous Stopped 09/16/20 0046)    Followed by  0.9 %  sodium chloride infusion (1,000 mLs Intravenous New Bag/Given 09/16/20 0013)  potassium chloride SA (KLOR-CON) CR tablet 40 mEq (40 mEq Oral Given 09/16/20 0108)                                                                                                                                    Procedures Procedures  (including critical care time)  Medical Decision Making / ED Course I have reviewed the nursing notes for this encounter and the patient's prior records (if available in EHR or on provided paperwork).   Debra West was evaluated in Emergency Department on 09/16/2020 for the symptoms described in the history of present illness. She was evaluated in the context of the global COVID-19 pandemic, which necessitated consideration that the patient might be at risk for infection with the SARS-CoV-2 virus that causes COVID-19.  Institutional protocols and algorithms that pertain to the evaluation of patients at risk for COVID-19 are in a state of rapid change based on information released by regulatory bodies including the CDC and federal and state organizations. These policies and algorithms were followed during the patient's care in the ED.  Near syncopal episode. Typical features for her. EKG without changes. Orthostatics were positive. Patient does have leukocytosis on CBC.  No infectious symptoms.  Likely demargination from this episode. She mild hyponatremia and hypokalemia.  She was provided with IV fluids and oral potassium. Also noted to have hypoglycemia without evidence of DKA. UA without evidence infection but is concentrated consistent with dehydration.  After fluids patient reported significant improvement.  No longer symptomatic.      Final Clinical Impression(s) / ED Diagnoses Final diagnoses:  Near syncope  Dehydration   The patient appears reasonably screened and/or stabilized for discharge and I doubt any other medical condition or other [facility] requiring further screening, evaluation, or treatment in the ED at this time prior to discharge. Safe for discharge with strict return precautions.  Disposition: Discharge  Condition: Good  I have discussed the results, Dx and Tx plan with the patient/family who expressed understanding and agree(s) with the plan. Discharge instructions discussed at length. The patient/family was given strict return precautions who verbalized understanding of the instructions. No further questions at time of discharge.    ED Discharge Orders     None         Follow Up: Sharlene DoryWendling, Nicholas Paul, DO [address] [place] [id number] [phone]  Call  as needed     This chart was dictated using voice recognition software.  Despite  best efforts to proofread,  errors can occur which can change the documentation meaning.      Nira Conn, MD 09/16/20 [phone]

## 2020-09-15 NOTE — ED Triage Notes (Signed)
Arrives via [facility]. C/o near syncopal event while getting a massage. Also reports nausea, dizziness and diaphoresis. Received 4mg  of Zofran and has 500 mL NS almost completed on arrival. Per EMS pt stated this has happened before. EMS reports NSR on 12 lead, CBG 214 on truck. Recent dx with DM, and put on Metformin.

## 2020-09-16 LAB — URINALYSIS, ROUTINE W REFLEX MICROSCOPIC
Glucose, UA: NEGATIVE mg/dL
Hgb urine dipstick: NEGATIVE
Ketones, ur: 15 mg/dL — AB
Leukocytes,Ua: NEGATIVE
Nitrite: NEGATIVE
Protein, ur: NEGATIVE mg/dL
Specific Gravity, Urine: >1.03 — ABNORMAL HIGH (ref 1.005–1.030)
pH: 5.5 (ref 5.0–8.0)

## 2020-09-16 LAB — HCG, QUANTITATIVE, PREGNANCY: hCG, Beta Chain, Quant, S: <1 m[IU]/mL (ref <5)

## 2020-09-16 MED ORDER — POTASSIUM CHLORIDE CRYS ER 20 MEQ PO TBCR
40.0000 meq | EXTENDED_RELEASE_TABLET | Freq: Once | ORAL | Status: AC
  Administered 2020-09-16: 40 meq via ORAL
  Filled 2020-09-16: qty 2

## 2020-09-18 ENCOUNTER — Ambulatory Visit: Payer: Managed Care, Other (non HMO) | Admitting: Family Medicine

## 2020-09-18 ENCOUNTER — Other Ambulatory Visit: Payer: Self-pay | Admitting: Family Medicine

## 2020-09-18 ENCOUNTER — Encounter: Payer: Self-pay | Admitting: Family Medicine

## 2020-09-18 ENCOUNTER — Other Ambulatory Visit: Payer: Self-pay

## 2020-09-18 VITALS — BP 122/86 | HR 98 | Temp 98.2°F | Ht 67.0 in | Wt 250.1 lb

## 2020-09-18 DIAGNOSIS — E1165 Type 2 diabetes mellitus with hyperglycemia: Secondary | ICD-10-CM | POA: Diagnosis not present

## 2020-09-18 MED ORDER — METFORMIN HCL ER 500 MG PO TB24: 500.0000 mg | ORAL_TABLET | Freq: Every day | ORAL | 2 refills | Status: DC

## 2020-09-18 NOTE — Patient Instructions (Signed)
Hold the Crestor for the next month.  Keep the diet clean and stay active.  Give [place] 2-3 business days to get the results of your labs back.   Let us know if you need anything.

## 2020-09-18 NOTE — Progress Notes (Signed)
Chief Complaint  Patient presents with   Hospitalization Follow-up    Subjective: Patient is a 45 y.o. female here for ED f/u.  Patient was seen in the ED on 6/11 after a near syncopal event.  She has a history of this but things were made worse after 6/9 where she increase the dosage of her metformin from 500 mg twice daily to 1000 mg in the morning and 500 mg in the evening.  She started getting gastrointestinal side effect such as cramping, nausea, and diarrhea.  She is unsure if the Crestor is contributing to this as well.  She has stopped it and symptoms are somewhat improved.  Diet has been healthy.  Exercise is limited due to her recent fall and recurrence of left wrist pain.  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 No accessory muscle use Psych: Age appropriate judgment and insight, normal affect and mood  Assessment and Plan: Type 2 diabetes mellitus with hyperglycemia, without long-term current use of insulin (HCC) - Plan: Lipid panel, Microalbumin / creatinine urine ratio  Check above.  We will  change metformin to extended release and keep her at 500 mg daily.  Her sugar is 140 today which is improved.  Counseled on diet and exercise.  Check above labs today.  I will see her in around 2 months where we will recheck her A1c and lipids.  She will hold her Crestor for the next month should she have any side effects from the extended release metformin.  Our team showed her how to check her sugars at home.  She will check up to 3 times weekly. The patient voiced understanding and agreement to the plan.  [place], DO 09/18/20  4:51 PM

## 2020-09-19 ENCOUNTER — Other Ambulatory Visit: Payer: Self-pay | Admitting: Family Medicine

## 2020-09-19 LAB — MICROALBUMIN / CREATININE URINE RATIO
Creatinine,U: 148.1 mg/dL
Microalb Creat Ratio: 1.7 mg/g (ref 0.0–30.0)
Microalb, Ur: 2.6 mg/dL — ABNORMAL HIGH (ref 0.0–1.9)

## 2020-09-19 LAB — LIPID PANEL
Cholesterol: 110 mg/dL (ref 0–200)
HDL: 38.4 mg/dL — ABNORMAL LOW (ref >39.00)
LDL Cholesterol: 46 mg/dL (ref 0–99)
NonHDL: 71.23
Total CHOL/HDL Ratio: 3
Triglycerides: 128 mg/dL (ref 0.0–149.0)
VLDL: 25.6 mg/dL (ref 0.0–40.0)

## 2020-09-19 MED ORDER — METFORMIN HCL ER 500 MG PO TB24: 500.0000 mg | ORAL_TABLET | Freq: Every day | ORAL | 2 refills | Status: DC

## 2020-11-21 ENCOUNTER — Encounter: Payer: Self-pay | Admitting: Family Medicine

## 2020-11-21 ENCOUNTER — Other Ambulatory Visit: Payer: Self-pay

## 2020-11-21 ENCOUNTER — Ambulatory Visit: Payer: Managed Care, Other (non HMO) | Admitting: Family Medicine

## 2020-11-21 VITALS — BP 118/70 | HR 98 | Temp 99.5°F | Ht 67.0 in | Wt 236.4 lb

## 2020-11-21 DIAGNOSIS — M79604 Pain in right leg: Secondary | ICD-10-CM

## 2020-11-21 DIAGNOSIS — S46811A Strain of other muscles, fascia and tendons at shoulder and upper arm level, right arm, initial encounter: Secondary | ICD-10-CM

## 2020-11-21 DIAGNOSIS — S46812A Strain of other muscles, fascia and tendons at shoulder and upper arm level, left arm, initial encounter: Secondary | ICD-10-CM | POA: Diagnosis not present

## 2020-11-21 DIAGNOSIS — M542 Cervicalgia: Secondary | ICD-10-CM

## 2020-11-21 DIAGNOSIS — S46819A Strain of other muscles, fascia and tendons at shoulder and upper arm level, unspecified arm, initial encounter: Secondary | ICD-10-CM

## 2020-11-21 MED ORDER — MELOXICAM 7.5 MG PO TABS: 7.5000 mg | ORAL_TABLET | Freq: Every day | ORAL | 0 refills | Status: DC
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
## 2020-11-21 NOTE — Addendum Note (Signed)
Addended by: Radene Gunning on: 11/21/2020 04:40 PM   Modules accepted: Level of Service

## 2020-11-21 NOTE — Patient Instructions (Addendum)
Heat (pad or rice pillow in microwave) over affected area, 10-15 minutes twice daily.   Ice/cold pack over area for 10-15 min twice daily.  OK to take Tylenol 1000 mg (2 extra strength tabs) or 975 mg (3 regular strength tabs) every 6 hours as needed.  Let us know if you need anything.  EXERCISES RANGE OF MOTION (ROM) AND STRETCHING EXERCISES  These exercises may help you when beginning to rehabilitate your issue. In order to successfully resolve your symptoms, you must improve your posture. These exercises are designed to help reduce the forward-head and rounded-shoulder posture which contributes to this condition. Your symptoms may resolve with or without further involvement from your physician, physical therapist or athletic trainer. While completing these exercises, remember:  Restoring tissue flexibility helps normal motion to return to the joints. This allows healthier, less painful movement and activity. An effective stretch should be held for at least 20 seconds, although you may need to begin with shorter hold times for comfort. A stretch should never be painful. You should only feel a gentle lengthening or release in the stretched tissue. Do not do any stretch or exercise that you cannot tolerate.  STRETCH- Axial Extensors Lie on your back on the floor. You may bend your knees for comfort. Place a rolled-up hand towel or dish towel, about 2 inches in diameter, under the part of your head that makes contact with the floor. Gently tuck your chin, as if trying to make a "double chin," until you feel a gentle stretch at the base of your head. Hold 15-20 seconds. Repeat 2-3 times. Complete this exercise 1 time per day.   STRETCH - Axial Extension  Stand or sit on a firm surface. Assume a good posture: chest up, shoulders drawn back, abdominal muscles slightly tense, knees unlocked (if standing) and feet hip width apart. Slowly retract your chin so your head slides back and your chin  slightly lowers. Continue to look straight ahead. You should feel a gentle stretch in the back of your head. Be certain not to feel an aggressive stretch since this can cause headaches later. Hold for 15-20 seconds. Repeat 2-3 times. Complete this exercise 1 time per day.  STRETCH - Cervical Side Bend  Stand or sit on a firm surface. Assume a good posture: chest up, shoulders drawn back, abdominal muscles slightly tense, knees unlocked (if standing) and feet hip width apart. Without letting your nose or shoulders move, slowly tip your right / left ear to your shoulder until your feel a gentle stretch in the muscles on the opposite side of your neck. Hold 15-20 seconds. Repeat 2-3 times. Complete this exercise 1-2 times per day.  STRETCH - Cervical Rotators  Stand or sit on a firm surface. Assume a good posture: chest up, shoulders drawn back, abdominal muscles slightly tense, knees unlocked (if standing) and feet hip width apart. Keeping your eyes level with the ground, slowly turn your head until you feel a gentle stretch along the back and opposite side of your neck. Hold 15-20 seconds. Repeat 2-3 times. Complete this exercise 1-2 times per day.  RANGE OF MOTION - Neck Circles  Stand or sit on a firm surface. Assume a good posture: chest up, shoulders drawn back, abdominal muscles slightly tense, knees unlocked (if standing) and feet hip width apart. Gently roll your head down and around from the back of one shoulder to the back of the other. The motion should never be forced or painful. Repeat the motion   10-20 times, or until you feel the neck muscles relax and loosen. Repeat 2-3 times. Complete the exercise 1-2 times per day. STRENGTHENING EXERCISES - Cervical Strain and Sprain These exercises may help you when beginning to rehabilitate your injury. They may resolve your symptoms with or without further involvement from your physician, physical therapist, or athletic trainer. While  completing these exercises, remember:  Muscles can gain both the endurance and the strength needed for everyday activities through controlled exercises. Complete these exercises as instructed by your physician, physical therapist, or athletic trainer. Progress the resistance and repetitions only as guided. You may experience muscle soreness or fatigue, but the pain or discomfort you are trying to eliminate should never worsen during these exercises. If this pain does worsen, stop and make certain you are following the directions exactly. If the pain is still present after adjustments, discontinue the exercise until you can discuss the trouble with your clinician.  STRENGTH - Cervical Flexors, Isometric Face a wall, standing about 6 inches away. Place a small pillow, a ball about 6-8 inches in diameter, or a folded towel between your forehead and the wall. Slightly tuck your chin and gently push your forehead into the soft object. Push only with mild to moderate intensity, building up tension gradually. Keep your jaw and forehead relaxed. Hold 10 to 20 seconds. Keep your breathing relaxed. Release the tension slowly. Relax your neck muscles completely before you start the next repetition. Repeat 2-3 times. Complete this exercise 1 time per day.  STRENGTH- Cervical Lateral Flexors, Isometric  Stand about 6 inches away from a wall. Place a small pillow, a ball about 6-8 inches in diameter, or a folded towel between the side of your head and the wall. Slightly tuck your chin and gently tilt your head into the soft object. Push only with mild to moderate intensity, building up tension gradually. Keep your jaw and forehead relaxed. Hold 10 to 20 seconds. Keep your breathing relaxed. Release the tension slowly. Relax your neck muscles completely before you start the next repetition. Repeat 2-3 times. Complete this exercise 1 time per day.  STRENGTH - Cervical Extensors, Isometric  Stand about 6 inches  away from a wall. Place a small pillow, a ball about 6-8 inches in diameter, or a folded towel between the back of your head and the wall. Slightly tuck your chin and gently tilt your head back into the soft object. Push only with mild to moderate intensity, building up tension gradually. Keep your jaw and forehead relaxed. Hold 10 to 20 seconds. Keep your breathing relaxed. Release the tension slowly. Relax your neck muscles completely before you start the next repetition. Repeat 2-3 times. Complete this exercise 1 time per day.  POSTURE AND BODY MECHANICS CONSIDERATIONS Keeping correct posture when sitting, standing or completing your activities will reduce the stress put on different body tissues, allowing injured tissues a chance to heal and limiting painful experiences. The following are general guidelines for improved posture. Your physician or physical therapist will provide you with any instructions specific to your needs. While reading these guidelines, remember: The exercises prescribed by your provider will help you have the flexibility and strength to maintain correct postures. The correct posture provides the optimal environment for your joints to work. All of your joints have less wear and tear when properly supported by a spine with good posture. This means you will experience a healthier, less painful body. Correct posture must be practiced with all of your activities, especially   prolonged sitting and standing. Correct posture is as important when doing repetitive low-stress activities (typing) as it is when doing a single heavy-load activity (lifting).  PROLONGED STANDING WHILE SLIGHTLY LEANING FORWARD When completing a task that requires you to lean forward while standing in one place for a long time, place either foot up on a stationary 2- to 4-inch high object to help maintain the best posture. When both feet are on the ground, the low back tends to lose its slight inward curve. If  this curve flattens (or becomes too large), then the back and your other joints will experience too much stress, fatigue more quickly, and can cause pain.   RESTING POSITIONS Consider which positions are most painful for you when choosing a resting position. If you have pain with flexion-based activities (sitting, bending, stooping, squatting), choose a position that allows you to rest in a less flexed posture. You would want to avoid curling into a fetal position on your side. If your pain worsens with extension-based activities (prolonged standing, working overhead), avoid resting in an extended position such as sleeping on your stomach. Most people will find more comfort when they rest with their spine in a more neutral position, neither too rounded nor too arched. Lying on a non-sagging bed on your side with a pillow between your knees, or on your back with a pillow under your knees will often provide some relief. Keep in mind, being in any one position for a prolonged period of time, no matter how correct your posture, can still lead to stiffness.  WALKING Walk with an upright posture. Your ears, shoulders, and hips should all line up. OFFICE WORK When working at a desk, create an environment that supports good, upright posture. Without extra support, muscles fatigue and lead to excessive strain on joints and other tissues.  CHAIR: A chair should be able to slide under your desk when your back makes contact with the back of the chair. This allows you to work closely. The chair's height should allow your eyes to be level with the upper part of your monitor and your hands to be slightly lower than your elbows. Body position: Your feet should make contact with the floor. If this is not possible, use a foot rest. Keep your ears over your shoulders. This will reduce stress on your neck and low back.   Ankle Exercises It is normal to feel mild stretching, pulling, tightness, or discomfort as you do  these exercises, but you should stop right away if you feel sudden pain or your pain gets worse.  Stretching and range of motion exercises These exercises warm up your muscles and joints and improve the movement and flexibility of your ankle. These exercises also help to relieve pain, numbness, and tingling. Exercise A: Dorsiflexion/Plantar Flexion    Sit with your affected knee straight or bent. Do not rest your foot on anything. Flex your affected ankle to tilt the top of your foot toward your shin. Hold this position for 5 seconds. Point your toes downward to tilt the top of your foot away from your shin. Hold this position for 5 seconds. Repeat 2 times. Complete this exercise 3 times per week. Exercise B: Ankle Alphabet    Sit with your affected foot supported at your lower leg. Do not rest your foot on anything. Make sure your foot has room to move freely. Think of your affected foot as a paintbrush, and move your foot to trace each letter of the  alphabet in the air. Keep your hip and knee still while you trace. Make the letters as large as you can without increasing any discomfort. Trace every letter from A to Z. Repeat 2 times. Complete this exercise 3 times per week. Strengthening exercises These exercises build strength and endurance in your ankle. Endurance is the ability to use your muscles for a long time, even after they get tired. Exercise D: Dorsiflexors    Secure a rubber exercise band or tube to an object, such as a table leg, that will stay still when the band is pulled. Secure the other end around your affected foot. Sit on the floor, facing the object with your affected leg extended. The band or tube should be slightly tense when your foot is relaxed. Slowly flex your affected ankle and toes to bring your foot toward you. Hold this position for 3 seconds.  Slowly return your foot to the starting position, controlling the band as you do that. Do a total of 10  repetitions. Repeat 2 times. Complete this exercise 3 times per week. Exercise E: Plantar Flexors    Sit on the floor with your affected leg extended. Loop a rubber exercise band or tube around the ball of your affected foot. The ball of your foot is on the walking surface, right under your toes. The band or tube should be slightly tense when your foot is relaxed. Slowly point your toes downward, pushing them away from you. Hold this position for 3 seconds. Slowly release the tension in the band or tube, controlling smoothly until your foot is back in the starting position. Repeat for a total of 10 repetitions. Repeat 2 times. Complete this exercise 3 times per week. Exercise F: Towel Curls    Sit in a chair on a non-carpeted surface, and put your feet on the floor. Place a towel in front of your feet.  Keeping your heel on the floor, put your affected foot on the towel. Pull the towel toward you by grabbing the towel with your toes and curling them under. Keep your heel on the floor. Let your toes relax. Grab the towel again. Keep going until the towel is completely underneath your foot. Repeat for a total of 10 repetitions. Repeat 2 times. Complete this exercise 3 times per week. Exercise G: Heel Raise ( Plantar Flexors, Standing)     Stand with your feet shoulder-width apart. Keep your weight spread evenly over the width of your feet while you rise up on your toes. Use a wall or table to steady yourself, but try not to use it for support. If this exercise is too easy, try these options: Shift your weight toward your affected leg until you feel challenged. If told by your health care provider, lift your uninjured leg off the floor. Hold this position for 3 seconds. Repeat for a total of 10 repetitions. Repeat 2 times. Complete this exercise 3 times per week. Exercise H: Tandem Walking Stand with one foot directly in front of the other. Slowly raise your back foot up, lifting your  heel before your toes, and place it directly in front of your other foot. Continue to walk in this heel-to-toe way for 10 steps or for as long as told by your health care provider. Have a countertop or wall nearby to use if needed to keep your balance, but try not to hold onto anything for support. Repeat 2 times. Complete this exercises 3 times per week. Make sure you discuss  any questions you have with your health care provider. Document Released: 02/05/2005 Document Revised: 11/22/2015 Document Reviewed: 12/10/2014 [employer] Interactive Patient Education  2018 [employer].

## 2020-11-26 ENCOUNTER — Other Ambulatory Visit: Payer: Self-pay

## 2020-11-26 ENCOUNTER — Ambulatory Visit (INDEPENDENT_AMBULATORY_CARE_PROVIDER_SITE_OTHER): Payer: Managed Care, Other (non HMO) | Admitting: Family Medicine

## 2020-11-26 ENCOUNTER — Encounter: Payer: Self-pay | Admitting: Family Medicine

## 2020-11-26 VITALS — BP 128/98 | Ht 67.0 in | Wt 236.0 lb

## 2020-11-26 DIAGNOSIS — S161XXA Strain of muscle, fascia and tendon at neck level, initial encounter: Secondary | ICD-10-CM | POA: Diagnosis not present

## 2020-11-26 MED ORDER — BACLOFEN 10 MG PO TABS: 5.0000 mg | ORAL_TABLET | Freq: Two times a day (BID) | ORAL | 0 refills | Status: DC | PRN
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
## 2020-11-26 NOTE — Patient Instructions (Signed)
Nice to meet you Please try the exercises  Please try the baclofen as needed and can help at night  Please try physical therapy   Please send me a message in MyChart with any questions or updates.  Please see me back in 2-3 weeks.   --Dr. Jordan Likes

## 2020-11-27 ENCOUNTER — Ambulatory Visit: Payer: No Typology Code available for payment source | Admitting: Family Medicine

## 2020-11-27 DIAGNOSIS — S161XXA Strain of muscle, fascia and tendon at neck level, initial encounter: Secondary | ICD-10-CM | POA: Insufficient documentation
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
## 2020-12-18 ENCOUNTER — Other Ambulatory Visit: Payer: Self-pay | Admitting: Family Medicine

## 2020-12-20 ENCOUNTER — Telehealth: Payer: Self-pay | Admitting: *Deleted

## 2020-12-20 MED ORDER — METFORMIN HCL ER 500 MG PO TB24: 500.0000 mg | ORAL_TABLET | Freq: Every day | ORAL | 0 refills | Status: DC

## 2020-12-20 NOTE — Telephone Encounter (Signed)
Left message on machine to call back  Patient needs to make follow up visit with Dr. Carmelia Roller.  Rx sent in for one month.

## 2020-12-20 NOTE — Telephone Encounter (Signed)
Contact Type Call Who Is Calling Patient / Member / Family / Caregiver Caller Name Joniece Smotherman Caller Phone Number [phone] Patient Name Debra West Patient DOB 27-Sep-1975 Call Type Message Only Information Provided Reason for Call Medication Question / Request Initial Comment Caller is low on her Metformin XR and it has no more refills. She has a few doses left but needs refill soon. No sxs. Additional Comment Office hours provided. Declined triage. Please send refill. Disp. Time Disposition Final User 12/20/2020 7:11:10 AM General Information Provided Yes Gokounous, Denny Peon

## 2020-12-21 ENCOUNTER — Ambulatory Visit: Payer: Managed Care, Other (non HMO) | Admitting: Family Medicine

## 2021-01-18 ENCOUNTER — Ambulatory Visit: Payer: Managed Care, Other (non HMO) | Admitting: Family Medicine

## 2021-01-18 ENCOUNTER — Encounter: Payer: Self-pay | Admitting: Family Medicine

## 2021-01-18 ENCOUNTER — Other Ambulatory Visit: Payer: Self-pay

## 2021-01-18 VITALS — BP 120/84 | HR 76 | Temp 99.3°F | Ht 67.0 in | Wt 238.2 lb

## 2021-01-18 DIAGNOSIS — Z23 Encounter for immunization: Secondary | ICD-10-CM | POA: Diagnosis not present

## 2021-01-18 DIAGNOSIS — E1165 Type 2 diabetes mellitus with hyperglycemia: Secondary | ICD-10-CM

## 2021-01-18 MED ORDER — BACLOFEN 10 MG PO TABS
20.0000 mg | ORAL_TABLET | Freq: Two times a day (BID) | ORAL | 0 refills | Status: DC | PRN
Start: 2021-01-18 — End: 2021-07-01

## 2021-01-18 MED ORDER — MELOXICAM 15 MG PO TABS: 15.0000 mg | ORAL_TABLET | Freq: Every day | ORAL | 0 refills | Status: DC

## 2021-01-18 NOTE — Progress Notes (Signed)
Subjective:   Chief Complaint  Patient presents with   Follow-up    Debra West is a 45 y.o. female here for follow-up of diabetes.   Debra West's self monitored glucose range is randomly mid 100's.  Patient denies hypoglycemic reactions. Patient does not require insulin.   Medications include: metformin XR 500 mg/d.  Diet is hit or miss.  Exercise: walking No CP or SOB.  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     Related testing: Retinal exam: Done  Objective:  BP 120/84   Pulse 76   Temp 99.3 F (37.4 C) (Oral)   Ht 5\' 7"  (1.702 m)   Wt 238 lb 4 oz (108.1 kg)   SpO2 96%   BMI 37.32 kg/m  General:  Well developed, well nourished, in no apparent distress Skin:  Warm, no pallor or diaphoresis Head:  Normocephalic, atraumatic Eyes:  Pupils equal and round, sclera anicteric without injection  Lungs:  CTAB, no access msc use Cardio:  RRR, no bruits, no LE edema Musculoskeletal:  Symmetrical muscle groups noted without atrophy or deformity Neuro:  Sensation intact to pinprick on feet Psych: Age appropriate judgment and insight  Assessment:   Type 2 diabetes mellitus with hyperglycemia, without long-term current use of insulin (HCC) - Plan: Comprehensive metabolic panel, Hemoglobin A1c, Lipid panel  Need for Tdap vaccination - Plan: Tdap vaccine greater than or equal to 7yo IM   Plan:   Chronic, uncontrolled.  For now, continue metformin  XR 500 mg daily.  May need to adjust this.  Would send to her mail order pharmacy.  Counseled on diet and exercise. She declined a flu shot. F/u in 3-6 mo pending the above.  We will schedule a physical and address health maintenance issues. The patient voiced understanding and agreement to the plan.  [place], DO 01/18/21 4:45 PM

## 2021-01-18 NOTE — Patient Instructions (Signed)
Try taking 1/2 tab of the meloxicam twice daily to see how things go.   Give [place] 2-3 business days to get the results of your labs back.   Keep the diet clean and stay active.  Aim to do some physical exertion for 150 minutes per week. This is typically divided into 5 days per week, 30 minutes per day. The activity should be enough to get your heart rate up. Anything is better than nothing if you have time constraints.  Let us know if you need anything.

## 2021-01-19 ENCOUNTER — Other Ambulatory Visit: Payer: Self-pay | Admitting: Family Medicine

## 2021-01-19 LAB — COMPREHENSIVE METABOLIC PANEL
AG Ratio: 1.6 (calc) (ref 1.0–2.5)
ALT: 30 U/L — ABNORMAL HIGH (ref 6–29)
AST: 33 U/L (ref 10–35)
Albumin: 4.3 g/dL (ref 3.6–5.1)
Alkaline phosphatase (APISO): 45 U/L (ref 31–125)
BUN: 9 mg/dL (ref 7–25)
CO2: 28 mmol/L (ref 20–32)
Calcium: 10 mg/dL (ref 8.6–10.2)
Chloride: 100 mmol/L (ref 98–110)
Creat: 0.57 mg/dL (ref 0.50–0.99)
Globulin: 2.7 g/dL (calc) (ref 1.9–3.7)
Glucose, Bld: 92 mg/dL (ref 65–99)
Potassium: 3.9 mmol/L (ref 3.5–5.3)
Sodium: 138 mmol/L (ref 135–146)
Total Bilirubin: 0.3 mg/dL (ref 0.2–1.2)
Total Protein: 7 g/dL (ref 6.1–8.1)

## 2021-01-19 LAB — HEMOGLOBIN A1C
Hgb A1c MFr Bld: 7.2 % of total Hgb — ABNORMAL HIGH (ref <5.7)
Mean Plasma Glucose: 160 mg/dL
eAG (mmol/L): 8.9 mmol/L

## 2021-01-19 LAB — LIPID PANEL
Cholesterol: 187 mg/dL (ref <200)
HDL: 40 mg/dL — ABNORMAL LOW (ref >=50)
LDL Cholesterol (Calc): 120 mg/dL (calc) — ABNORMAL HIGH
Non-HDL Cholesterol (Calc): 147 mg/dL (calc) — ABNORMAL HIGH (ref <130)
Total CHOL/HDL Ratio: 4.7 (calc) (ref <5.0)
Triglycerides: 152 mg/dL — ABNORMAL HIGH (ref <150)

## 2021-01-19 MED ORDER — METFORMIN HCL ER 500 MG PO TB24: 500.0000 mg | ORAL_TABLET | Freq: Every day | ORAL | 2 refills | Status: DC

## 2021-01-24 ENCOUNTER — Encounter: Payer: Self-pay | Admitting: Family Medicine

## 2021-01-29 ENCOUNTER — Telehealth: Payer: Self-pay | Admitting: Family Medicine

## 2021-01-29 NOTE — Telephone Encounter (Signed)
Please sched. Ty.

## 2021-01-29 NOTE — Telephone Encounter (Signed)
Scheduled appointment 01/30/21 at 11:30.

## 2021-01-29 NOTE — Telephone Encounter (Signed)
Debra West from [facility] saw pt for consolation today, pt would need breast surgery scheduled November 1st. Surgical clearness. Labs CBC, CMP, and any other testing the pcp may think is necessary. Please reach out to pt to scheduled in office visit as soon as possible. Also, inform [facility] when appointment is scheduled. [facility]- [phone]. Please advise.

## 2021-01-30 ENCOUNTER — Ambulatory Visit: Payer: Managed Care, Other (non HMO) | Admitting: Family Medicine

## 2021-01-30 ENCOUNTER — Other Ambulatory Visit: Payer: Self-pay

## 2021-01-30 ENCOUNTER — Encounter: Payer: Self-pay | Admitting: Family Medicine

## 2021-01-30 VITALS — BP 109/71 | HR 92 | Temp 98.4°F | Ht 67.0 in | Wt 235.4 lb

## 2021-01-30 DIAGNOSIS — Z01818 Encounter for other preprocedural examination: Secondary | ICD-10-CM | POA: Diagnosis not present

## 2021-01-30 LAB — CBC WITH DIFFERENTIAL/PLATELET
Basophils Absolute: 0.1 10*3/uL (ref 0.0–0.1)
Basophils Relative: 0.5 % (ref 0.0–3.0)
Eosinophils Absolute: 0.2 10*3/uL (ref 0.0–0.7)
Eosinophils Relative: 1.3 % (ref 0.0–5.0)
HCT: 38.8 % (ref 36.0–46.0)
Hemoglobin: 12.4 g/dL (ref 12.0–15.0)
Lymphocytes Relative: 24.6 % (ref 12.0–46.0)
Lymphs Abs: 3.1 10*3/uL (ref 0.7–4.0)
MCHC: 31.9 g/dL (ref 30.0–36.0)
MCV: 84.3 fl (ref 78.0–100.0)
Monocytes Absolute: 0.7 10*3/uL (ref 0.1–1.0)
Monocytes Relative: 5.7 % (ref 3.0–12.0)
Neutro Abs: 8.5 10*3/uL — ABNORMAL HIGH (ref 1.4–7.7)
Neutrophils Relative %: 67.9 % (ref 43.0–77.0)
Platelets: 379 10*3/uL (ref 150.0–400.0)
RBC: 4.6 Mil/uL (ref 3.87–5.11)
RDW: 14.5 % (ref 11.5–15.5)
WBC: 12.5 10*3/uL — ABNORMAL HIGH (ref 4.0–10.5)

## 2021-01-30 NOTE — Patient Instructions (Addendum)
Give [place] a day to get the results of your labs back.   We will be following along with your care from the specialty teams.   Do not take meloxicam/Mobic, aspirin or other NSAIDs (Aleve, Advil, ibuprofen, etc) within 5 days of surgery until verification from your surgeon.   Let us know if you need anything.

## 2021-01-30 NOTE — Telephone Encounter (Signed)
Called [facility] but office closed. Patient was seen by PCP 01/30/21. Cleared/notes/labs completed  Will call back to get the fax number.

## 2021-01-30 NOTE — Progress Notes (Signed)
Subjective:   Chief Complaint  Patient presents with   Pre-op Exam    Debra West  is here for a Pre-operative physical at the request of Dr. Jerelyn Scott.   She  is having breast surgery on 02/05/21 for suspicious lesion in breast.  Personal or family hx of adverse outcome to anesthesia? No  Chipped, cracked, missing, or loose teeth? No  Decreased ROM of neck? No  Able to walk up 2 flights of stairs without becoming significantly short of breath or having chest pain? Yes   Revised Goldman Criteria: High Risk Surgery (intraperitoneal, intrathoracic, aortic): No  Ischemic heart disease (Prior MI, +excercise stress test, angina, nitrate use, Qwave): No  History of heart failure: No  History of cerebrovascular disease: No  History of diabetes: Yes  Insulin therapy for DM: No  Preoperative Cr >2.0: No   Patient Active Problem List   Diagnosis Date Noted   Cervical strain 11/27/2020   Type 2 diabetes mellitus with hyperglycemia, without long-term current use of insulin (HCC) 08/27/2020   GAD (generalized anxiety disorder) 08/15/2020   Vitamin D deficiency 08/04/2018   Psychophysiological insomnia 08/04/2018   Dyspepsia 08/04/2018   Atopic dermatitis 08/04/2018   Intermittent low back pain 08/04/2018   Ganglion cyst of volar aspect of left wrist 04/16/2018   Left wrist pain 04/16/2018   Obstructive sleep apnea syndrome 11/27/2016   Vocal fold paresis, 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   Pernicious anemia 05/16/2016   Morbid obesity due to excess calories (HCC) 05/16/2016   Migraine without aura and without status migrainosus, not intractable    Gastroesophageal reflux disease    Hypothyroidism 01/17/2016   Left ovarian cyst 03/16/2014   Encounter for general adult medical examination without abnormal findings 09/23/2010   Diaphragmatic hernia 06/14/2010   Adjustment disorder with depressed mood 07/12/2008   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   Past Medical History:  Diagnosis Date   Anxiety    Cardiac  arrhythmia    Chronic headaches    Diabetes mellitus without complication (HCC)    GERD (gastroesophageal reflux disease)    Hypothyroidism 01/17/2016   Runs in family   Left ovarian cyst 03/16/2014   Overview:  02/2014 2.3 x 2.1 03/15/14 4 x 4.1 septated labs pending started on OCP's   Migraine without aura and without status migrainosus, not intractable    Obstructive sleep apnea syndrome 11/27/2016   wears mouth guard   Pernicious anemia    Vasodepressor syncope    Vocal fold paresis, 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 30 each 0   FLUoxetine (PROZAC) 20 MG capsule Take 1 capsule (20 mg total) by mouth daily. 90 capsule 2   [name] 1.5/30 1.5-30 MG-MCG tablet TAKE 1 TABLET DAILY 63 tablet 3   ibuprofen (ADVIL,MOTRIN) 200 MG tablet Take 2-3 tablets (400-600 mg total) by mouth every 8 (eight) hours as needed for fever, headache, mild pain, moderate pain or cramping.     levothyroxine (SYNTHROID) 50 MCG tablet Take 1 tablet (50 mcg total) by mouth daily. 90 tablet 2   meloxicam (MOBIC) 15 MG tablet Take 1 tablet (15 mg total) by mouth daily. 30  tablet 0   metFORMIN (GLUCOPHAGE-XR) 500 MG 24 hr tablet Take 1 tablet (500 mg total) by mouth daily with breakfast. 90 tablet 2   midodrine (PROAMATINE) 5 MG tablet Take 1 tablet (5 mg total) by mouth 3 (three) times daily as needed (for low blood pressure (less than 105)). 15 tablet 1   omeprazole (PRILOSEC) 20 MG capsule Take 1 capsule (20 mg total) by mouth daily. 30 capsule 3   vitamin B-12 (CYANOCOBALAMIN) 1000 MCG tablet Take 1,000 mcg by mouth daily.     Allergies  Allergen Reactions   Depo-Medrol [Methylprednisolone  Sodium Succ] Shortness Of Breath   Aspirin Nausea And Vomiting    Family History  Problem Relation Age of Onset   Thyroid disease Mother    Heart disease Father    Stroke Father    Heart attack Father    Thyroid disease Sister    Lung cancer Maternal Grandfather    Heart disease Maternal Grandfather    Diabetes Paternal Grandmother    Thyroid cancer Paternal Grandmother    Heart disease Paternal Grandfather    Breast cancer Paternal Aunt    Bladder Cancer Paternal Aunt    Thyroid disease Paternal Aunt    Cancer Paternal Aunt      Review of Systems:  Constitutional:  no fevers Eye:  no recent significant change in vision Ear:  no hearing loss Nose/Mouth/Throat:  No dental complaints Neck/Thyroid:  no lumps or masses Pulmonary:  No shortness of breath Cardiovascular:  no chest pain Gastrointestinal:  no abdominal pain GU:  negative for dysuria Musculoskeletal/Extremities:  no new pain Skin/Integumentary ROS:  no abnormal skin lesions reported Neurologic:  no HA   Objective:   Vitals:   01/30/21 1125  BP: 109/71  Pulse: 92  Temp: 98.4 F (36.9 C)  TempSrc: Oral  SpO2: 97%  Weight: 235 lb 6 oz (106.8 kg)  Height: 5\' 7"  (1.702 m)   Body mass index is 36.86 kg/m.  General:  well developed, well nourished, in no apparent distress Skin:  warm, no pallor or diaphoresis Head:  normocephalic, atraumatic Eyes:  pupils equal and round, sclera anicteric without injection Ears:  no ext lesions Throat/Pharynx:  lips and gingiva without lesion; tongue and uvula midline; non-inflamed pharynx; no exudates or postnasal drainage Neck: neck supple without adenopathy, thyromegaly, or masses, no bruits, no jugular venous distention Lungs:  clear to auscultation, breath sounds equal bilaterally, no respiratory distress Cardio:  regular rate and rhythm without murmurs Abdomen:  abdomen soft, nontender; bowel sounds normal; no masses, hepatomegaly or splenomegaly Musculoskeletal:   symmetrical muscle groups noted without atrophy or deformity Extremities:  no clubbing, cyanosis, or edema, no deformities, no skin discoloration Neuro:  gait normal Psych: Age appropriate judgment and insight; normal mood   Assessment:   Preop examination - Plan: CBC w/Diff   Plan:   Pending the above workup, the patient is deemed low cardiac risk for the proposed procedure. According to Revised Goldman's criteria, she has a 1% risk of a major cardiac event. She is medically optimized for surgery.  I would like her to hold NSAIDs for 5 d prior to surgery. She is OK to take today, but unless her surgeon states otherwise, want her to avoid starting tomorrow.   The patient voiced understanding and agreement to the plan.  [place], DO 01/30/21  11:48 AM

## 2021-01-31 ENCOUNTER — Telehealth: Payer: Self-pay | Admitting: Family Medicine

## 2021-01-31 NOTE — Telephone Encounter (Signed)
Pt needs clearance for surgery on Tuesday 11/1 summary, ov, lab results anything that would be helpful for her surgery. Please fax to [facility]. Fax #: [phone]. Please advise.

## 2021-01-31 NOTE — Telephone Encounter (Signed)
OV note faxed

## 2021-02-01 NOTE — Telephone Encounter (Signed)
Pt states [facility] did not receive all of the information they needed for clearance. Pt stated they needed lab results from last two tests. Pt stated that they needed this by 10/27, and she asked if this could be done asap.  Please advise  Please fax over all info to [facility] at [phone]  Dr.Tjoe or nurse Langley Adie

## 2021-02-11 ENCOUNTER — Other Ambulatory Visit: Payer: Self-pay | Admitting: Family Medicine

## 2021-02-11 DIAGNOSIS — N83202 Unspecified ovarian cyst, left side: Secondary | ICD-10-CM

## 2021-02-14 ENCOUNTER — Other Ambulatory Visit: Payer: Self-pay | Admitting: Family Medicine

## 2021-02-14 MED ORDER — METFORMIN HCL ER 500 MG PO TB24
1000.0000 mg | ORAL_TABLET | Freq: Every day | ORAL | 2 refills | Status: DC
Start: 2021-02-14 — End: 2021-03-20

## 2021-02-15 ENCOUNTER — Telehealth: Payer: Self-pay | Admitting: Family Medicine

## 2021-02-15 ENCOUNTER — Other Ambulatory Visit: Payer: Self-pay | Admitting: Family Medicine

## 2021-02-15 DIAGNOSIS — M79601 Pain in right arm: Secondary | ICD-10-CM

## 2021-02-15 NOTE — Telephone Encounter (Signed)
Patient informed of PCP instructions. 

## 2021-02-15 NOTE — Telephone Encounter (Signed)
PT ordered. Called the patient to inform--left message to call back.

## 2021-02-15 NOTE — Telephone Encounter (Signed)
She is wanting to know if she can double up on DM medication due to elevated sugar. BS has  Been in AM before eating 130.  This am was 125. Surgeon thinks she may not heal as fast as she could due to this. Her wound is not closing up and she also fell on left side of her body. Was ok just some soreness. PT request is due to her surgery---Right arm weakness Shoulder down to her thumb. The surgeon thinks it is muscular.

## 2021-02-15 NOTE — Telephone Encounter (Signed)
Called left message to call back 

## 2021-02-15 NOTE — Telephone Encounter (Signed)
Pt called in and stated she wanted to discuss physical therapy. She would like a call back to discuss this further.

## 2021-03-12 ENCOUNTER — Encounter: Payer: Self-pay | Admitting: Physical Therapy

## 2021-03-12 ENCOUNTER — Other Ambulatory Visit: Payer: Self-pay

## 2021-03-12 ENCOUNTER — Ambulatory Visit: Payer: Managed Care, Other (non HMO) | Attending: Family Medicine | Admitting: Physical Therapy

## 2021-03-12 DIAGNOSIS — M62838 Other muscle spasm: Secondary | ICD-10-CM

## 2021-03-12 DIAGNOSIS — R293 Abnormal posture: Secondary | ICD-10-CM | POA: Diagnosis present

## 2021-03-12 DIAGNOSIS — M25611 Stiffness of right shoulder, not elsewhere classified: Secondary | ICD-10-CM

## 2021-03-12 DIAGNOSIS — R29898 Other symptoms and signs involving the musculoskeletal system: Secondary | ICD-10-CM

## 2021-03-12 DIAGNOSIS — M6281 Muscle weakness (generalized): Secondary | ICD-10-CM | POA: Diagnosis present

## 2021-03-12 DIAGNOSIS — M25621 Stiffness of right elbow, not elsewhere classified: Secondary | ICD-10-CM

## 2021-03-12 DIAGNOSIS — M79601 Pain in right arm: Secondary | ICD-10-CM | POA: Diagnosis not present
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
## 2021-03-12 NOTE — Patient Instructions (Addendum)
     Access Code: [email or web address] URL: [email or web address] Date: 03/12/2021 Prepared by: Glenetta Hew  Exercises Flexion-Extension Shoulder Pendulum with Table Support - 3 x daily - 7 x weekly - 2 sets - 10 reps Horizontal Shoulder Pendulum with Table Support - 3 x daily - 7 x weekly - 2 sets - 10 reps Circular Shoulder Pendulum with Table Support - 3 x daily - 7 x weekly - 2 sets - 10 reps

## 2021-03-18 ENCOUNTER — Other Ambulatory Visit: Payer: Self-pay

## 2021-03-18 ENCOUNTER — Ambulatory Visit: Payer: Managed Care, Other (non HMO) | Admitting: Physical Therapy

## 2021-03-18 ENCOUNTER — Encounter: Payer: Self-pay | Admitting: Physical Therapy

## 2021-03-18 DIAGNOSIS — M79601 Pain in right arm: Secondary | ICD-10-CM

## 2021-03-18 DIAGNOSIS — M6281 Muscle weakness (generalized): Secondary | ICD-10-CM

## 2021-03-18 DIAGNOSIS — M25611 Stiffness of right shoulder, not elsewhere classified: Secondary | ICD-10-CM

## 2021-03-18 DIAGNOSIS — M62838 Other muscle spasm: Secondary | ICD-10-CM

## 2021-03-18 DIAGNOSIS — R293 Abnormal posture: Secondary | ICD-10-CM

## 2021-03-18 DIAGNOSIS — M25621 Stiffness of right elbow, not elsewhere classified: Secondary | ICD-10-CM

## 2021-03-18 DIAGNOSIS — R29898 Other symptoms and signs involving the musculoskeletal system: Secondary | ICD-10-CM

## 2021-03-18 NOTE — Patient Instructions (Addendum)
     Access Code: [email or web address] URL: [email or web address] Date: 03/18/2021 Prepared by: Glenetta Hew  Exercises Flexion-Extension Shoulder Pendulum with Table Support - 3 x daily - 7 x weekly - 2 sets - 10 reps Horizontal Shoulder Pendulum with Table Support - 3 x daily - 7 x weekly - 2 sets - 10 reps Circular Shoulder Pendulum with Table Support - 3 x daily - 7 x weekly - 2 sets - 10 reps Seated Wrist Flexion Stretch - 2-3 x daily - 7 x weekly - 3 reps - 30 sec hold Seated Wrist Extension Stretch - 2-3 x daily - 7 x weekly - 3 reps - 30 sec hold Seated Scapular Retraction - 2-3 x daily - 7 x weekly - 2 sets - 10 reps - 5 sec hold Standing Backward Shoulder Rolls - 2-3 x daily - 7 x weekly - 2 sets - 10 reps - 3 sec hold Supine Shoulder Flexion AAROM with Dowel - 2-3 x daily - 7 x weekly - 2 sets - 10 reps - 3 sec hold Supine Shoulder External Rotation with Dowel - 2-3 x daily - 7 x weekly - 2 sets - 10 reps - 3 sec hold Towel Roll Squeeze - 2-3 x daily - 7 x weekly - 2 sets - 10 reps - 3 sec hold
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
## 2021-03-20 ENCOUNTER — Other Ambulatory Visit: Payer: Self-pay | Admitting: Family Medicine

## 2021-03-20 ENCOUNTER — Encounter: Payer: Self-pay | Admitting: Family Medicine

## 2021-03-20 MED ORDER — METFORMIN HCL ER 500 MG PO TB24: 1000.0000 mg | ORAL_TABLET | Freq: Every day | ORAL | 2 refills | Status: DC

## 2021-03-21 ENCOUNTER — Other Ambulatory Visit: Payer: Self-pay

## 2021-03-21 ENCOUNTER — Encounter: Payer: Self-pay | Admitting: Physical Therapy

## 2021-03-21 ENCOUNTER — Ambulatory Visit: Payer: Managed Care, Other (non HMO) | Admitting: Physical Therapy

## 2021-03-21 DIAGNOSIS — M79601 Pain in right arm: Secondary | ICD-10-CM

## 2021-03-21 DIAGNOSIS — M25611 Stiffness of right shoulder, not elsewhere classified: Secondary | ICD-10-CM

## 2021-03-21 DIAGNOSIS — M25621 Stiffness of right elbow, not elsewhere classified: Secondary | ICD-10-CM

## 2021-03-21 DIAGNOSIS — M62838 Other muscle spasm: Secondary | ICD-10-CM

## 2021-03-21 DIAGNOSIS — R29898 Other symptoms and signs involving the musculoskeletal system: Secondary | ICD-10-CM

## 2021-03-21 DIAGNOSIS — M6281 Muscle weakness (generalized): Secondary | ICD-10-CM

## 2021-03-21 DIAGNOSIS — R293 Abnormal posture: Secondary | ICD-10-CM
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
## 2021-03-26 ENCOUNTER — Encounter: Payer: Managed Care, Other (non HMO) | Admitting: Physical Therapy

## 2021-03-27 ENCOUNTER — Ambulatory Visit: Payer: Managed Care, Other (non HMO) | Admitting: Physical Therapy

## 2021-03-27 ENCOUNTER — Other Ambulatory Visit: Payer: Self-pay

## 2021-03-27 ENCOUNTER — Encounter: Payer: Self-pay | Admitting: Physical Therapy

## 2021-03-27 DIAGNOSIS — M25621 Stiffness of right elbow, not elsewhere classified: Secondary | ICD-10-CM

## 2021-03-27 DIAGNOSIS — R293 Abnormal posture: Secondary | ICD-10-CM

## 2021-03-27 DIAGNOSIS — M25611 Stiffness of right shoulder, not elsewhere classified: Secondary | ICD-10-CM

## 2021-03-27 DIAGNOSIS — M62838 Other muscle spasm: Secondary | ICD-10-CM

## 2021-03-27 DIAGNOSIS — M79601 Pain in right arm: Secondary | ICD-10-CM

## 2021-03-27 DIAGNOSIS — M6281 Muscle weakness (generalized): Secondary | ICD-10-CM

## 2021-03-27 DIAGNOSIS — R29898 Other symptoms and signs involving the musculoskeletal system: Secondary | ICD-10-CM
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
## 2021-04-04 ENCOUNTER — Encounter: Payer: Self-pay | Admitting: Physical Therapy

## 2021-04-04 ENCOUNTER — Ambulatory Visit: Payer: Managed Care, Other (non HMO) | Admitting: Physical Therapy

## 2021-04-04 ENCOUNTER — Other Ambulatory Visit: Payer: Self-pay

## 2021-04-04 DIAGNOSIS — R293 Abnormal posture: Secondary | ICD-10-CM

## 2021-04-04 DIAGNOSIS — R29898 Other symptoms and signs involving the musculoskeletal system: Secondary | ICD-10-CM

## 2021-04-04 DIAGNOSIS — M25611 Stiffness of right shoulder, not elsewhere classified: Secondary | ICD-10-CM

## 2021-04-04 DIAGNOSIS — M62838 Other muscle spasm: Secondary | ICD-10-CM

## 2021-04-04 DIAGNOSIS — M6281 Muscle weakness (generalized): Secondary | ICD-10-CM

## 2021-04-04 DIAGNOSIS — M79601 Pain in right arm: Secondary | ICD-10-CM

## 2021-04-04 DIAGNOSIS — M25621 Stiffness of right elbow, not elsewhere classified: Secondary | ICD-10-CM
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
## 2021-04-09 ENCOUNTER — Other Ambulatory Visit: Payer: Self-pay

## 2021-04-09 ENCOUNTER — Ambulatory Visit: Payer: 59 | Attending: Family Medicine | Admitting: Physical Therapy

## 2021-04-09 ENCOUNTER — Encounter: Payer: Self-pay | Admitting: Physical Therapy

## 2021-04-09 DIAGNOSIS — M25621 Stiffness of right elbow, not elsewhere classified: Secondary | ICD-10-CM | POA: Diagnosis present

## 2021-04-09 DIAGNOSIS — M79601 Pain in right arm: Secondary | ICD-10-CM | POA: Diagnosis present

## 2021-04-09 DIAGNOSIS — R29898 Other symptoms and signs involving the musculoskeletal system: Secondary | ICD-10-CM

## 2021-04-09 DIAGNOSIS — R293 Abnormal posture: Secondary | ICD-10-CM | POA: Diagnosis present

## 2021-04-09 DIAGNOSIS — M62838 Other muscle spasm: Secondary | ICD-10-CM | POA: Diagnosis present

## 2021-04-09 DIAGNOSIS — M6281 Muscle weakness (generalized): Secondary | ICD-10-CM | POA: Diagnosis present

## 2021-04-09 DIAGNOSIS — M25611 Stiffness of right shoulder, not elsewhere classified: Secondary | ICD-10-CM | POA: Diagnosis present
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
## 2021-04-09 NOTE — Patient Instructions (Addendum)
° ° ° °  Access Code: [email or web address] URL: [email or web address] Date: 04/09/2021 Prepared by: Glenetta Hew  Exercises Flexion-Extension Shoulder Pendulum with Table Support - 3 x daily - 7 x weekly - 2 sets - 10 reps Horizontal Shoulder Pendulum with Table Support - 3 x daily - 7 x weekly - 2 sets - 10 reps Circular Shoulder Pendulum with Table Support - 3 x daily - 7 x weekly - 2 sets - 10 reps Seated Wrist Flexion Stretch - 2-3 x daily - 7 x weekly - 3 reps - 30 sec hold Seated Wrist Extension Stretch - 2-3 x daily - 7 x weekly - 3 reps - 30 sec hold Seated Scapular Retraction - 2-3 x daily - 7 x weekly - 2 sets - 10 reps - 5 sec hold Standing Backward Shoulder Rolls - 2-3 x daily - 7 x weekly - 2 sets - 10 reps - 3 sec hold Supine Shoulder Flexion AAROM with Dowel - 2-3 x daily - 7 x weekly - 2 sets - 10 reps - 3 sec hold Supine Shoulder External Rotation with Dowel - 2-3 x daily - 7 x weekly - 2 sets - 10 reps - 3 sec hold Towel Roll Squeeze - 2-3 x daily - 7 x weekly - 2 sets - 10 reps - 3 sec hold Standing Bilateral Low Shoulder Row with Anchored Resistance - 1 x daily - 7 x weekly - 2 sets - 10 reps - 5 sec hold Scapular Retraction with Resistance Advanced - 1 x daily - 7 x weekly - 2 sets - 10 reps - 5 sec hold Seated Eccentric Wrist Flexion with Dumbbell - 1 x daily - 7 x weekly - 2 sets - 10 reps - 3 sec hold Seated Eccentric Wrist Extension - 1 x daily - 7 x weekly - 2 sets - 10 reps - 3 sec hold Eccentric Wrist Extension with Resistance - 1 x daily - 7 x weekly - 2 sets - 10 reps - 3 sec hold Putty Squeezes - 1 x daily - 7 x weekly - 2 sets - 10 reps - 3 sec hold  Patient Education Ionto Pt Instructions - [facility]

## 2021-04-11 ENCOUNTER — Ambulatory Visit: Payer: 59

## 2021-04-11 ENCOUNTER — Other Ambulatory Visit: Payer: Self-pay

## 2021-04-11 DIAGNOSIS — M62838 Other muscle spasm: Secondary | ICD-10-CM

## 2021-04-11 DIAGNOSIS — M79601 Pain in right arm: Secondary | ICD-10-CM | POA: Diagnosis not present

## 2021-04-11 DIAGNOSIS — R293 Abnormal posture: Secondary | ICD-10-CM

## 2021-04-11 DIAGNOSIS — M6281 Muscle weakness (generalized): Secondary | ICD-10-CM

## 2021-04-11 DIAGNOSIS — R29898 Other symptoms and signs involving the musculoskeletal system: Secondary | ICD-10-CM

## 2021-04-11 DIAGNOSIS — M25611 Stiffness of right shoulder, not elsewhere classified: Secondary | ICD-10-CM

## 2021-04-11 DIAGNOSIS — M25621 Stiffness of right elbow, not elsewhere classified: Secondary | ICD-10-CM
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
## 2021-04-16 ENCOUNTER — Encounter: Payer: Self-pay | Admitting: Physical Therapy

## 2021-04-16 ENCOUNTER — Other Ambulatory Visit: Payer: Self-pay

## 2021-04-16 ENCOUNTER — Ambulatory Visit: Payer: 59 | Admitting: Physical Therapy

## 2021-04-16 ENCOUNTER — Other Ambulatory Visit: Payer: Self-pay | Admitting: Anesthesiology

## 2021-04-16 DIAGNOSIS — M25611 Stiffness of right shoulder, not elsewhere classified: Secondary | ICD-10-CM

## 2021-04-16 DIAGNOSIS — R29898 Other symptoms and signs involving the musculoskeletal system: Secondary | ICD-10-CM

## 2021-04-16 DIAGNOSIS — R293 Abnormal posture: Secondary | ICD-10-CM

## 2021-04-16 DIAGNOSIS — M25621 Stiffness of right elbow, not elsewhere classified: Secondary | ICD-10-CM

## 2021-04-16 DIAGNOSIS — M6281 Muscle weakness (generalized): Secondary | ICD-10-CM

## 2021-04-16 DIAGNOSIS — M79601 Pain in right arm: Secondary | ICD-10-CM

## 2021-04-16 DIAGNOSIS — M5030 Other cervical disc degeneration, unspecified cervical region: Secondary | ICD-10-CM

## 2021-04-16 DIAGNOSIS — M62838 Other muscle spasm: Secondary | ICD-10-CM

## 2021-04-16 DIAGNOSIS — M542 Cervicalgia: Secondary | ICD-10-CM
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
## 2021-04-18 ENCOUNTER — Other Ambulatory Visit: Payer: Self-pay

## 2021-04-18 ENCOUNTER — Ambulatory Visit: Payer: 59 | Admitting: Physical Therapy

## 2021-04-18 ENCOUNTER — Encounter: Payer: Self-pay | Admitting: Physical Therapy

## 2021-04-18 DIAGNOSIS — M6281 Muscle weakness (generalized): Secondary | ICD-10-CM

## 2021-04-18 DIAGNOSIS — M62838 Other muscle spasm: Secondary | ICD-10-CM

## 2021-04-18 DIAGNOSIS — M79601 Pain in right arm: Secondary | ICD-10-CM

## 2021-04-18 DIAGNOSIS — M25621 Stiffness of right elbow, not elsewhere classified: Secondary | ICD-10-CM

## 2021-04-18 DIAGNOSIS — M25611 Stiffness of right shoulder, not elsewhere classified: Secondary | ICD-10-CM

## 2021-04-18 DIAGNOSIS — R29898 Other symptoms and signs involving the musculoskeletal system: Secondary | ICD-10-CM

## 2021-04-18 DIAGNOSIS — R293 Abnormal posture: Secondary | ICD-10-CM

## 2021-04-18 NOTE — Patient Instructions (Signed)
   Kinesiology tape  What is kinesiology tape?  There are many brands of kinesiology tape. KTape, Rock Tape, Body Sport, Dynamic tape, to name a few.  It is an elasticized tape designed to support the body's natural healing process. This tape provides stability and support to muscles and joints without restricting motion.  It can also help decrease swelling in the area of application.  How does it work?  The tape microscopically lifts and decompresses the skin to allow for drainage of lymph (swelling) to flow away from area, reducing inflammation. The tape has the ability to help re-educate the neuromuscular system by targeting specific receptors in the skin. The presence of the tape increases the body's awareness of posture and body mechanics.  Do not use with:  . Open wounds . Skin lesions . Adhesive allergies  In some rare cases, mild/moderate skin irritation can occur. This can include redness, itchiness, or hives. If this occurs, immediately remove tape and consult your primary care physician if symptoms are severe or do not resolve within 2 days.  Safe removal of the tape:  To remove tape safely, hold nearby skin with one hand and gentle roll tape down with other hand. You can apply oil or conditioner to tape while in shower prior to removal to loosen adhesive. DO NOT swiftly rip tape off like a band-aid, as this could cause skin tears and additional skin irritation.     For questions, please contact your therapist at:  [facility] [place] [address]  [address] [place], [place], [address] Phone: [phone]   Fax:  [phone]     
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
## 2021-04-22 ENCOUNTER — Encounter: Payer: Self-pay | Admitting: Physical Therapy

## 2021-04-22 ENCOUNTER — Other Ambulatory Visit: Payer: Self-pay

## 2021-04-22 ENCOUNTER — Ambulatory Visit: Payer: 59 | Admitting: Physical Therapy

## 2021-04-22 DIAGNOSIS — M79601 Pain in right arm: Secondary | ICD-10-CM

## 2021-04-22 DIAGNOSIS — R293 Abnormal posture: Secondary | ICD-10-CM

## 2021-04-22 DIAGNOSIS — M6281 Muscle weakness (generalized): Secondary | ICD-10-CM

## 2021-04-22 DIAGNOSIS — R29898 Other symptoms and signs involving the musculoskeletal system: Secondary | ICD-10-CM

## 2021-04-22 DIAGNOSIS — M25611 Stiffness of right shoulder, not elsewhere classified: Secondary | ICD-10-CM

## 2021-04-22 DIAGNOSIS — M25621 Stiffness of right elbow, not elsewhere classified: Secondary | ICD-10-CM

## 2021-04-22 DIAGNOSIS — M62838 Other muscle spasm: Secondary | ICD-10-CM
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
## 2021-04-22 NOTE — Patient Instructions (Signed)
° °  Access Code: [email or web address] URL: [email or web address] Date: 04/22/2021 Prepared by: Glenetta Hew  Exercises Flexion-Extension Shoulder Pendulum with Table Support - 3 x daily - 7 x weekly - 2 sets - 10 reps Horizontal Shoulder Pendulum with Table Support - 3 x daily - 7 x weekly - 2 sets - 10 reps Circular Shoulder Pendulum with Table Support - 3 x daily - 7 x weekly - 2 sets - 10 reps Seated Wrist Flexion Stretch - 2-3 x daily - 7 x weekly - 3 reps - 30 sec hold Seated Wrist Extension Stretch - 2-3 x daily - 7 x weekly - 3 reps - 30 sec hold Seated Scapular Retraction - 2-3 x daily - 7 x weekly - 2 sets - 10 reps - 5 sec hold Standing Backward Shoulder Rolls - 2-3 x daily - 7 x weekly - 2 sets - 10 reps - 3 sec hold Supine Shoulder Flexion AAROM with Dowel - 2-3 x daily - 7 x weekly - 2 sets - 10 reps - 3 sec hold Supine Shoulder External Rotation with Dowel - 2-3 x daily - 7 x weekly - 2 sets - 10 reps - 3 sec hold Towel Roll Squeeze - 2-3 x daily - 7 x weekly - 2 sets - 10 reps - 3 sec hold Standing Bilateral Low Shoulder Row with Anchored Resistance - 1 x daily - 7 x weekly - 2 sets - 10 reps - 5 sec hold Scapular Retraction with Resistance Advanced - 1 x daily - 7 x weekly - 2 sets - 10 reps - 5 sec hold Seated Eccentric Wrist Flexion with Dumbbell - 1 x daily - 7 x weekly - 2 sets - 10 reps - 3 sec hold Seated Eccentric Wrist Extension - 1 x daily - 7 x weekly - 2 sets - 10 reps - 3 sec hold Eccentric Wrist Extension with Resistance - 1 x daily - 7 x weekly - 2 sets - 10 reps - 3 sec hold Putty Squeezes - 1 x daily - 7 x weekly - 2 sets - 10 reps - 3 sec hold Seated Upper Trapezius Stretch - 1 x daily - 7 x weekly - 3 reps - 30 sec hold Seated Levator Scapulae Stretch - 1 x daily - 7 x weekly - 3 reps - 30 sec hold  Patient Education [place] Instructions - [facility] Kinesiology tape Brachial plexus nerve glide

## 2021-04-24 ENCOUNTER — Encounter: Payer: Self-pay | Admitting: Physical Therapy

## 2021-04-24 ENCOUNTER — Ambulatory Visit: Payer: 59 | Admitting: Physical Therapy

## 2021-04-24 ENCOUNTER — Other Ambulatory Visit: Payer: Self-pay

## 2021-04-24 ENCOUNTER — Encounter: Payer: Self-pay | Admitting: Family Medicine

## 2021-04-24 DIAGNOSIS — M79601 Pain in right arm: Secondary | ICD-10-CM

## 2021-04-24 DIAGNOSIS — M62838 Other muscle spasm: Secondary | ICD-10-CM

## 2021-04-24 DIAGNOSIS — M25611 Stiffness of right shoulder, not elsewhere classified: Secondary | ICD-10-CM

## 2021-04-24 DIAGNOSIS — R293 Abnormal posture: Secondary | ICD-10-CM

## 2021-04-24 DIAGNOSIS — R29898 Other symptoms and signs involving the musculoskeletal system: Secondary | ICD-10-CM

## 2021-04-24 DIAGNOSIS — M25621 Stiffness of right elbow, not elsewhere classified: Secondary | ICD-10-CM

## 2021-04-24 DIAGNOSIS — M6281 Muscle weakness (generalized): Secondary | ICD-10-CM
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
## 2021-04-30 ENCOUNTER — Ambulatory Visit: Payer: 59 | Admitting: Physical Therapy

## 2021-04-30 ENCOUNTER — Ambulatory Visit: Payer: 59 | Admitting: Family Medicine

## 2021-04-30 ENCOUNTER — Encounter: Payer: Self-pay | Admitting: Family Medicine

## 2021-04-30 VITALS — BP 118/70 | HR 84 | Temp 98.3°F | Ht 67.0 in | Wt 232.5 lb

## 2021-04-30 DIAGNOSIS — R208 Other disturbances of skin sensation: Secondary | ICD-10-CM | POA: Diagnosis not present

## 2021-04-30 DIAGNOSIS — R29898 Other symptoms and signs involving the musculoskeletal system: Secondary | ICD-10-CM

## 2021-04-30 MED ORDER — GABAPENTIN 300 MG PO CAPS
300.0000 mg | ORAL_CAPSULE | Freq: Three times a day (TID) | ORAL | 3 refills | Status: DC
Start: 2021-04-30 — End: 2021-05-02

## 2021-04-30 NOTE — Progress Notes (Signed)
Musculoskeletal Exam  Patient: Debra West DOB: 1975/07/02  DOS: 04/30/2021  SUBJECTIVE:  Chief Complaint:   Chief Complaint  Patient presents with   Pain    Right arm pain    Debra West is a R-handed 46 y.o.  female for evaluation and treatment of R upper extremity pain.   Onset:  3 months ago. No inj or change in activity but started after a breast biopsy.  Location: rdial side of hand/forearm, behind upper arm, into neck Character:  aching, burning, and throbbing  Progression of issue:  has worsened Associated symptoms: decreased ROM, weakness due to pain Treatment: to date has been muscle relaxers, PT, and TENS unite, topical lidocaine, Tylenol.   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 No acute distress. Thorax & Lungs: No accessory muscle use Musculoskeletal: RUE.   Normal active range of motion: no.   Normal passive range of motion: no Tenderness to palpation: yes over trap, omohyoid region, posterior RUE, dorsum of forearm Deformity: no Ecchymosis: no Tests positive: none Tests negative: Spurling's Neurologic: Normal sensory function. No focal deficits noted. DTR's equal and symmetric in UE's. No  clonus. 4/5 strength in RUE, 5/5 strength throughout LUE Psychiatric: Normal mood. Age appropriate judgment and insight. Alert & oriented x 3.    Assessment:  Allodynia - Plan: gabapentin (NEURONTIN) 300 MG capsule  Weakness of right upper extremity - Plan: Ambulatory referral to Neurology  Plan: Chronic, uncontrolled. Trial gabapentin 300 mg tid, warned of drowsiness. Refer neuro for their opinion for nerve involvement. Stretches/exercises, heat, ice, Tylenol.  F/u in 1 mo to reck. The patient voiced understanding and agreement to the plan.   [place], DO 04/30/21  8:40 AM

## 2021-04-30 NOTE — Patient Instructions (Addendum)
Heat (pad or rice pillow in microwave) over affected area, 10-15 minutes twice daily.   Ice/cold pack over area for 10-15 min twice daily.  OK to take Tylenol 1000 mg (2 extra strength tabs) or 975 mg (3 regular strength tabs) every 6 hours as needed.  If you do not hear anything about your referral in the next 1-2 weeks, call our office and ask for an update.  Let us know if you need anything.

## 2021-05-01 ENCOUNTER — Encounter: Payer: Self-pay | Admitting: Neurology

## 2021-05-02 ENCOUNTER — Encounter: Payer: Self-pay | Admitting: Family Medicine

## 2021-05-02 ENCOUNTER — Other Ambulatory Visit: Payer: Self-pay | Admitting: Family Medicine

## 2021-05-02 MED ORDER — PREGABALIN 75 MG PO CAPS: 75.0000 mg | ORAL_CAPSULE | Freq: Two times a day (BID) | ORAL | 1 refills | Status: DC

## 2021-05-08 ENCOUNTER — Encounter: Payer: Self-pay | Admitting: Family Medicine

## 2021-05-09 ENCOUNTER — Ambulatory Visit: Payer: 59

## 2021-05-13 ENCOUNTER — Encounter: Payer: 59 | Admitting: Physical Therapy

## 2021-05-13 ENCOUNTER — Other Ambulatory Visit: Payer: Self-pay | Admitting: Family Medicine

## 2021-05-13 DIAGNOSIS — E039 Hypothyroidism, unspecified: Secondary | ICD-10-CM

## 2021-05-13 DIAGNOSIS — F411 Generalized anxiety disorder: Secondary | ICD-10-CM

## 2021-05-20 ENCOUNTER — Ambulatory Visit: Payer: 59 | Admitting: Physical Therapy

## 2021-05-27 ENCOUNTER — Encounter: Payer: 59 | Admitting: Physical Therapy

## 2021-05-31 ENCOUNTER — Encounter: Payer: Self-pay | Admitting: Family Medicine

## 2021-05-31 ENCOUNTER — Other Ambulatory Visit: Payer: Self-pay | Admitting: Family Medicine

## 2021-05-31 ENCOUNTER — Ambulatory Visit: Payer: 59 | Admitting: Family Medicine

## 2021-05-31 VITALS — BP 112/70 | HR 89 | Temp 98.1°F | Ht 67.0 in | Wt 231.4 lb

## 2021-05-31 DIAGNOSIS — M9906 Segmental and somatic dysfunction of lower extremity: Secondary | ICD-10-CM | POA: Diagnosis not present

## 2021-05-31 DIAGNOSIS — E1165 Type 2 diabetes mellitus with hyperglycemia: Secondary | ICD-10-CM

## 2021-05-31 DIAGNOSIS — R208 Other disturbances of skin sensation: Secondary | ICD-10-CM

## 2021-05-31 DIAGNOSIS — R29898 Other symptoms and signs involving the musculoskeletal system: Secondary | ICD-10-CM

## 2021-05-31 DIAGNOSIS — Z6835 Body mass index (BMI) 35.0-35.9, adult: Secondary | ICD-10-CM

## 2021-05-31 DIAGNOSIS — M79601 Pain in right arm: Secondary | ICD-10-CM

## 2021-05-31 LAB — LIPID PANEL
Cholesterol: 159 mg/dL (ref 0–200)
HDL: 45.8 mg/dL (ref >39.00)
LDL Cholesterol: 94 mg/dL (ref 0–99)
NonHDL: 113.61
Total CHOL/HDL Ratio: 3
Triglycerides: 96 mg/dL (ref 0.0–149.0)
VLDL: 19.2 mg/dL (ref 0.0–40.0)

## 2021-05-31 LAB — HEMOGLOBIN A1C: Hgb A1c MFr Bld: 7.3 % — ABNORMAL HIGH (ref 4.6–6.5)

## 2021-05-31 LAB — COMPREHENSIVE METABOLIC PANEL
ALT: 14 U/L (ref 0–35)
AST: 17 U/L (ref 0–37)
Albumin: 4.3 g/dL (ref 3.5–5.2)
Alkaline Phosphatase: 43 U/L (ref 39–117)
BUN: 10 mg/dL (ref 6–23)
CO2: 28 mEq/L (ref 19–32)
Calcium: 9.5 mg/dL (ref 8.4–10.5)
Chloride: 101 mEq/L (ref 96–112)
Creatinine, Ser: 0.63 mg/dL (ref 0.40–1.20)
GFR: 106.13 mL/min (ref >60.00)
Glucose, Bld: 149 mg/dL — ABNORMAL HIGH (ref 70–99)
Potassium: 4 mEq/L (ref 3.5–5.1)
Sodium: 137 mEq/L (ref 135–145)
Total Bilirubin: 0.5 mg/dL (ref 0.2–1.2)
Total Protein: 7.1 g/dL (ref 6.0–8.3)

## 2021-05-31 MED ORDER — METFORMIN HCL ER 500 MG PO TB24: 1000.0000 mg | ORAL_TABLET | Freq: Every day | ORAL | 2 refills | Status: DC

## 2021-05-31 MED ORDER — PREGABALIN 25 MG PO CAPS: 25.0000 mg | ORAL_CAPSULE | Freq: Two times a day (BID) | ORAL | 2 refills | Status: DC

## 2021-05-31 NOTE — Patient Instructions (Signed)
We need physical therapy for the legs/feet. Let me know when you are ready for that.  Keep the diet clean and stay active.  Give [place] 2-3 business days to get the results of your labs back.   Let us know if you need anything.

## 2021-05-31 NOTE — Progress Notes (Signed)
Chief Complaint  Patient presents with   Follow-up    Right arm pain    Subjective: Patient is a 46 y.o. female here for f/u RUE pain.  Seen 1 mo ago and rx'd gabapentin. Unfortunately it caused her sugars to elevate and she was changed to Lyrica 75 mg bid. Since that time, she has had around 80% improvement.  She does have high amount of drowsiness associated with it though.  Over the last 2 weeks, she has had tingling on the top of her feet that wakes her up from sleep.  She has associated pain over the lateral leg just distal to the knee.  It is worse on the right side.  There was no injury or change in activity.  She does not get this during the day.  No swelling, bruising, skin color changes, or weakness associated with this.  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   Objective: BP 112/70    Pulse 89    Temp 98.1 F (36.7 C) (Oral)    Ht 5\' 7"  (1.702 m)    Wt 231 lb 6 oz (105 kg)    SpO2 99%    BMI 36.24 kg/m  General: Awake, appears stated age Heart: DP pulses 2+ bilaterally MSK: TTP over the right upper extremity though less so than before; no deformity or soft tissue swelling noted; +ttp over the fibular heads bilaterally, worse on the right Neuro: DTRs equal and symmetric throughout the upper extremities without clonus, grip strength adequate today; sensation decreased to pinprick on the dorsum of the right foot and left foot, worse in the right;  sensation intact on the plantar surface. Lungs: No accessory muscle use Psych: Age appropriate judgment and insight, normal affect and mood  Assessment and Plan: Allodynia - Plan: pregabalin (LYRICA) 25 MG capsule  Right arm pain - Plan: pregabalin (LYRICA) 25 MG capsule  Weakness of right upper extremity - Plan: pregabalin (LYRICA) 25 MG capsule  Somatic dysfunction of both lower extremities  Type 2 diabetes mellitus with hyperglycemia, without long-term current use of insulin (HCC) - Plan: Comprehensive metabolic panel, Lipid panel, Hemoglobin A1c  Severe obesity (BMI 35.0-35.9 with comorbidity) (HCC)  1/2/3.  Adverse effect of medication, decrease Lyrica from 75 mg twice daily to 25 mg twice daily.  She has an appointment with the neurology team next month. 4.  Offered PT. Politely declined at this time. 5.  Check labs. Follow-up pending the above results. The patient voiced understanding and agreement to the plan.  10-29-1975 [place], DO 05/31/21  8:32 AM

## 2021-06-03 ENCOUNTER — Encounter: Payer: 59 | Admitting: Physical Therapy

## 2021-06-24 ENCOUNTER — Encounter: Payer: Self-pay | Admitting: Family Medicine

## 2021-07-01 ENCOUNTER — Ambulatory Visit: Payer: 59 | Admitting: Neurology

## 2021-07-01 ENCOUNTER — Encounter: Payer: Self-pay | Admitting: Neurology

## 2021-07-01 ENCOUNTER — Other Ambulatory Visit: Payer: Self-pay

## 2021-07-01 VITALS — BP 136/89 | HR 78 | Ht 67.0 in | Wt 231.0 lb

## 2021-07-01 DIAGNOSIS — M79601 Pain in right arm: Secondary | ICD-10-CM | POA: Diagnosis not present

## 2021-07-01 MED ORDER — TIZANIDINE HCL 2 MG PO TABS: 2.0000 mg | ORAL_TABLET | Freq: Every evening | ORAL | 3 refills | Status: DC | PRN

## 2021-07-01 NOTE — Patient Instructions (Signed)
Nerve testing of the right arm ? ?Start tizanidine 2mg  at bedtime for neck pain ? ?Continue home neck stretching exercises ? ?Referral to Dr. in Sports Medicine ? ?ELECTROMYOGRAM AND NERVE CONDUCTION STUDIES (EMG/NCS) INSTRUCTIONS ? ?How to Prepare ?The neurologist conducting the EMG will need to know if you have certain medical conditions. Tell the neurologist and other EMG lab personnel if you: ?Have a pacemaker or any other electrical medical device ?Take blood-thinning medications ?Have hemophilia, a blood-clotting disorder that causes prolonged bleeding ?Bathing ?Take a shower or bath shortly before your exam in order to remove oils from your skin. Don?t apply lotions or creams before the exam.  ?What to Expect ?You?ll likely be asked to change into a hospital gown for the procedure and lie down on an examination table. The following explanations can help you understand what will happen during the exam.  ?Electrodes. The neurologist or a technician places surface electrodes at various locations on your skin depending on where you?re experiencing symptoms. Or the neurologist may insert needle electrodes at different sites depending on your symptoms.  ?Sensations. The electrodes will at times transmit a tiny electrical current that you may feel as a twinge or spasm. The needle electrode may cause discomfort or pain that usually ends shortly after the needle is removed. ?If you are concerned about discomfort or pain, you may want to talk to the neurologist about taking a short break during the exam.  ?Instructions. During the needle EMG, the neurologist will assess whether there is any spontaneous electrical activity when the muscle is at rest - activity that isn?t present in healthy muscle tissue - and the degree of activity when you slightly contract the muscle.  ?He or she will give you instructions on resting and contracting a muscle at appropriate times. Depending on what muscles and nerves the  neurologist is examining, he or she may ask you to change positions during the exam.  ?After your EMG ?You may experience some temporary, minor bruising where the needle electrode was inserted into your muscle. This bruising should fade within several days. If it persists, contact your primary care doctor.  ? ?
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
## 2021-07-02 ENCOUNTER — Ambulatory Visit: Payer: 59 | Admitting: Neurology

## 2021-07-02 DIAGNOSIS — M79601 Pain in right arm: Secondary | ICD-10-CM | POA: Diagnosis not present

## 2021-07-02 NOTE — Procedures (Signed)
[facility]  ?[address] ? [place], [place] [address] ?Tel: [phone] ?Fax:  [phone] ?Test Date:  07/02/2021 ? ?Patient: Debra West DOB: April 10, 1975 Physician: Nita Sickle, DO  ?Sex: Female Height: 5\' 7"  Ref Phys: , DO  ?ID#: Nita Sickle   Technician:   ? ?Patient Complaints: ?This is a 46 year old female referred for evaluation of diffuse right arm pain. ? ?NCV & EMG Findings: ?Extensive electrodiagnostic testing of the right upper extremity shows: ?All sensory responses including the right median, ulnar, radial, mixed palmar, medial antebrachial, and lateral antebrachial cutaneous sensory responses are within normal limits. ?All motor responses including the right median, ulnar, and radial nerves are within normal limits. ?There is no evidence of active or chronic motor axonal loss changes affecting any of the tested muscles, including cervical paraspinal muscles.  Motor unit configuration and recruitment pattern is within normal limits. ? ?Impression: ?This is a normal study of the right upper extremity.  In particular, there is no evidence of a right cervical radiculopathy, brachial plexopathy, sensorimotor polyneuropathy, or carpal tunnel syndrome. ? ? ?___________________________ ?49, DO ? ? ? ?Nerve Conduction Studies ?Anti Sensory Summary Table ? ? Stim Site NR Peak (ms) Norm Peak (ms) P-T Amp (?V) Norm P-T Amp  ?Right Lat Ante Brach Cutan Anti Sensory (Lat Forearm)  35?C  ?Lat Biceps    1.7 <2.9 14.3 >10  ?Right Med Ante Brach Cutan Anti Sensory (Med Forearm)  35?C  ?Elbow    2.3  13.2 >10  ?Right Median Anti Sensory (2nd Digit)  35?C  ?Wrist    3.2 <3.4 43.7 >20  ?Right Radial Anti Sensory (Base 1st Digit)  35?C  ?Wrist    1.7 <2.7 38.3 >18  ?Right Ulnar Anti Sensory (5th Digit)  35?C  ?Wrist    2.2 <3.1 43.7 >12  ? ?Motor Summary Table ? ? Stim Site NR Onset (ms) Norm Onset (ms) O-P Amp (mV) Norm O-P Amp Site1 Site2 Delta-0 (ms) Dist (cm) Vel  (m/s) Norm Vel (m/s)  ?Right Median Motor (Abd Poll Brev)  35?C  ?Wrist    2.9 <3.9 8.3 >6 Elbow Wrist 4.5 28.0 62 >50  ?Elbow    7.4  8.1         ?Right Radial Motor (Ext Ind Prop)  35?C  ?7cm    2.0 <3.1 7.7 >6 ACF 7cm 1.6 11.0 69 >50  ?ACF    3.6  7.2  B-Elbow ACF 1.2 9.0 75   ?B-Elbow    4.8  7.2  A-Elbow B-Elbow 0.3 2.0 67   ?A-Elbow    5.1  6.6         ?Right Ulnar Motor (Abd Dig Minimi)  35?C  ?Wrist    1.9 <3.1 8.2 >7 B Elbow Wrist 3.4 21.0 62 >50  ?B Elbow    5.3  7.3  A Elbow B Elbow 1.7 10.0 59 >50  ?A Elbow    7.0  7.0         ? ?Comparison Summary Table ? ? Stim Site NR Peak (ms) Norm Peak (ms) P-T Amp (?V) Site1 Site2 Delta-P (ms) Norm Delta (ms)  ?Right Median/Ulnar Palm Comparison (Wrist - 8cm)  35?C  ?Median Palm    1.9 <2.2 62.8 Median Palm Ulnar Palm 0.1   ?Ulnar Palm    2.0 <2.2 13.1      ? ?EMG ? ? Side Muscle Ins Act Fibs Psw Fasc Number Recrt Dur Dur. Amp Amp. Poly Poly. Comment  ?  Right 1stDorInt Nml Nml Nml Nml Nml Nml Nml Nml Nml Nml Nml Nml N/A  ?Right Ext Indicis Nml Nml Nml Nml Nml Nml Nml Nml Nml Nml Nml Nml N/A  ?Right PronatorTeres Nml Nml Nml Nml Nml Nml Nml Nml Nml Nml Nml Nml N/A  ?Right Biceps Nml Nml Nml Nml Nml Nml Nml Nml Nml Nml Nml Nml N/A  ?Right Triceps Nml Nml Nml Nml Nml Nml Nml Nml Nml Nml Nml Nml N/A  ?Right Deltoid Nml Nml Nml Nml Nml Nml Nml Nml Nml Nml Nml Nml N/A  ?Right Cervical Parasp Low Nml Nml Nml Nml NE - - - - - - - N/A  ? ? ? ? ?Waveforms: ?    ? ?    ? ?    ? ? ?

## 2021-07-10 ENCOUNTER — Encounter: Payer: Self-pay | Admitting: Family Medicine

## 2021-07-10 ENCOUNTER — Ambulatory Visit: Payer: 59 | Admitting: Family Medicine

## 2021-07-10 ENCOUNTER — Ambulatory Visit: Payer: Self-pay

## 2021-07-10 VITALS — BP 118/80 | Ht 67.0 in | Wt 231.0 lb

## 2021-07-10 DIAGNOSIS — D72828 Other elevated white blood cell count: Secondary | ICD-10-CM

## 2021-07-10 DIAGNOSIS — G629 Polyneuropathy, unspecified: Secondary | ICD-10-CM | POA: Insufficient documentation

## 2021-07-10 DIAGNOSIS — E559 Vitamin D deficiency, unspecified: Secondary | ICD-10-CM | POA: Diagnosis not present

## 2021-07-10 DIAGNOSIS — M7711 Lateral epicondylitis, right elbow: Secondary | ICD-10-CM | POA: Diagnosis not present

## 2021-07-10 MED ORDER — METHYLPREDNISOLONE ACETATE 40 MG/ML IJ SUSP
40.0000 mg | Freq: Once | INTRAMUSCULAR | Status: AC
  Administered 2021-07-10: 40 mg via INTRA_ARTICULAR

## 2021-07-10 NOTE — Assessment & Plan Note (Signed)
Acute on chronic in nature. Having weakness of grip and strength issues. EMG has been normal as well as MRI cervical spine.  ?-Counseled on home exercise therapy and supportive care. ?-Injection today. ?-Could evaluate for possible thoracic outlet. ?

## 2021-07-10 NOTE — Assessment & Plan Note (Signed)
Check vitamin D. 

## 2021-07-10 NOTE — Assessment & Plan Note (Signed)
Acute on chronic in nature. Unclear if this has association with her current status  ?- counseled on supportive care ?- path smear review ?

## 2021-07-10 NOTE — Progress Notes (Signed)
?Debra West - 46 y.o. female MRN [id number]  Date of birth: 04-14-1975 ? ?SUBJECTIVE:  Including CC & ROS.  ?No chief complaint on file. ? ? ?Debra West is a 46 y.o. female that is presenting with acute right arm pain with altered sensation and pain in her lower extremities bilaterally.  She has had the symptoms ongoing since October or November.  Work-up has been fairly unrevealing thus far. ? ?Review of the neurology note from 3/27 shows she was having right arm pain that sounds more musculoskeletal in nature ?Review of the EMG of the right arm shows a normal study. ?Review of the MRI cervical spine from 05/06/2021 shows no high-grade canal or foraminal stenosis of the cervical spine. ? ?Review of Systems ?See HPI  ? ?HISTORY: Past Medical, Surgical, Social, and Family History Reviewed & Updated per EMR.   ?Pertinent Historical Findings include: ? ?Past Medical History:  ?Diagnosis Date  ? Anxiety   ? Cardiac arrhythmia   ? Chronic headaches   ? Diabetes mellitus without complication ([place])   ? GERD (gastroesophageal reflux disease)   ? Hypothyroidism 01/17/2016  ? Runs in family  ? Left ovarian cyst 03/16/2014  ? Overview:  02/2014 2.3 x 2.1 03/15/14 4 x 4.1 septated labs pending started on OCP's  ? Migraine without aura and without status migrainosus, not intractable   ? Obstructive sleep apnea syndrome 11/27/2016  ? wears mouth guard  ? Pernicious anemia   ? Vasodepressor syncope   ? 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 ? ?Past Surgical History:  ?Procedure Laterality Date  ? CHOLECYSTECTOMY  2004  ? OVARIAN CYST REMOVAL Left   ? WISDOM TOOTH EXTRACTION    ? WRIST SURGERY    ? ? ? ?PHYSICAL EXAM:  ?VS: BP 118/80 (BP Location: Left Arm, Patient Position: Sitting)   Ht 5\' 7"  (1.702 m)   Wt 231 lb (104.8 kg)   BMI 36.18 kg/m?  ?Physical Exam ?Gen: NAD, alert, cooperative with exam, well-appearing ?MSK:  ?Neurovascularly intact   ? ?Limited ultrasound: Right elbow: ? ?Increased hyperemia at the lateral  epicondyle ? ?Summary: findings consistent with lateral epicondylitis  ? ?Ultrasound and interpretation by Clearance Coots, MD ? ? ?Aspiration/Injection Procedure Note ?Debra West ?13-Jun-1975 ? ?Procedure: Injection ?Indications: Right elbow pain ? ?Procedure Details ?Consent: Risks of procedure as well as the alternatives and risks of each were explained to the (patient/caregiver).  Consent for procedure obtained. ?Time Out: Verified patient identification, verified procedure, site/side was marked, verified correct patient position, special equipment/implants available, medications/allergies/relevent history reviewed, required imaging and test results available.  Performed.  The area was cleaned with iodine and alcohol swabs.   ? ?The right lateral condyle was injected using 1 cc's of 40 mg Depo-Medrol and 2 cc's of 0.25% bupivacaine with a 25 1 1/2" needle.  Ultrasound was used. Images were obtained in long views showing the injection.   ? ? ?A sterile dressing was applied. ? ?Patient did tolerate procedure well. ? ? ? ? ?ASSESSMENT & PLAN:  ? ?Vitamin D deficiency ?Check vitamin D ? ? ?Leucocytosis ?Acute on chronic in nature. Unclear if this has association with her current status  ?- counseled on supportive care ?- path smear review ? ?Lateral epicondylitis of right elbow ?Acute on chronic in nature. Having weakness of grip and strength issues. EMG has been normal as well as MRI cervical spine.  ?-Counseled on home exercise therapy and supportive care. ?-Injection today. ?-Could evaluate for possible thoracic outlet. ? ?  Polyneuropathy ?Acute on chronic in nature.  Having altered sensation and pain in her dorsum of her feet and ankle. ?-Counseled on supportive care. ?-Iron and ferritin. ? ? ? ? ?

## 2021-07-10 NOTE — Assessment & Plan Note (Signed)
Acute on chronic in nature.  Having altered sensation and pain in her dorsum of her feet and ankle. ?-Counseled on supportive care. ?-Iron and ferritin. ?

## 2021-07-10 NOTE — Patient Instructions (Signed)
Good to see you ?Please try ice as needed ?Please try the exercises  ?I will call with the results from today  ?Please send me a message in MyChart with any questions or updates.  ?Please see me back in 4 weeks.  ? ?--Dr. Jordan Likes ? ?

## 2021-07-11 ENCOUNTER — Telehealth: Payer: Self-pay | Admitting: Family Medicine

## 2021-07-11 LAB — VITAMIN D 25 HYDROXY (VIT D DEFICIENCY, FRACTURES): Vit D, 25-Hydroxy: 67.1 ng/mL (ref 30.0–100.0)

## 2021-07-11 LAB — IRON,TIBC AND FERRITIN PANEL
Ferritin: 88 ng/mL (ref 15–150)
Iron Saturation: 14 % — ABNORMAL LOW (ref 15–55)
Iron: 61 ug/dL (ref 27–159)
Total Iron Binding Capacity: 449 ug/dL (ref 250–450)
UIBC: 388 ug/dL (ref 131–425)

## 2021-07-11 NOTE — Telephone Encounter (Signed)
I spoke to pt- she states she has been taking ibuprofen 600 mg q 4 hours and ice is not touching her pain. I informed her that pain and soreness is expected after her injection. She now c/o spasms each time she moves her arm and states she has even less ROM.  ?

## 2021-07-11 NOTE — Telephone Encounter (Signed)
Patient called states she can barely move the elbow/arm (Right) injection as given yesterday 07/10/21. ? ?--Forwarding message to med asst to review w/provider & call pt back @ [phone] ? ?--glh ?

## 2021-07-11 NOTE — Telephone Encounter (Signed)
Answered patient's questions. Provided work note.  ? ?Myra Rude, MD ?[facility] ?07/11/2021, 4:39 PM ? ?

## 2021-07-19 LAB — PATHOLOGIST SMEAR REVIEW
Basophils Absolute: 0.1 10*3/uL (ref 0.0–0.2)
Basos: 1 %
EOS (ABSOLUTE): 0.2 10*3/uL (ref 0.0–0.4)
Eos: 2 %
Hematocrit: 40.4 % (ref 34.0–46.6)
Hemoglobin: 13.4 g/dL (ref 11.1–15.9)
Immature Grans (Abs): 0 10*3/uL (ref 0.0–0.1)
Immature Granulocytes: 0 %
Lymphocytes Absolute: 5.2 10*3/uL — ABNORMAL HIGH (ref 0.7–3.1)
Lymphs: 33 %
MCH: 27.2 pg (ref 26.6–33.0)
MCHC: 33.2 g/dL (ref 31.5–35.7)
MCV: 82 fL (ref 79–97)
Monocytes Absolute: 0.8 10*3/uL (ref 0.1–0.9)
Monocytes: 5 %
Neutrophils Absolute: 9.5 10*3/uL — ABNORMAL HIGH (ref 1.4–7.0)
Neutrophils: 59 %
Path Rev PLTs: NORMAL
Path Rev RBC: NORMAL
Platelets: 359 10*3/uL (ref 150–450)
RBC: 4.92 x10E6/uL (ref 3.77–5.28)
RDW: 13.6 % (ref 11.7–15.4)
WBC: 15.8 10*3/uL — ABNORMAL HIGH (ref 3.4–10.8)

## 2021-07-22 ENCOUNTER — Telehealth: Payer: Self-pay | Admitting: Family Medicine

## 2021-07-22 NOTE — Telephone Encounter (Signed)
Informed of results.  ? ?Myra Rude, MD ?[facility] ?07/22/2021, 12:53 PM ? ?

## 2021-09-10 ENCOUNTER — Ambulatory Visit: Payer: Self-pay

## 2021-09-10 ENCOUNTER — Encounter: Payer: Self-pay | Admitting: Family Medicine

## 2021-09-10 ENCOUNTER — Ambulatory Visit: Payer: 59 | Admitting: Family Medicine

## 2021-09-10 VITALS — BP 128/82 | Ht 67.0 in | Wt 231.0 lb

## 2021-09-10 DIAGNOSIS — M7711 Lateral epicondylitis, right elbow: Secondary | ICD-10-CM

## 2021-09-10 DIAGNOSIS — M7712 Lateral epicondylitis, left elbow: Secondary | ICD-10-CM

## 2021-09-10 MED ORDER — METHYLPREDNISOLONE ACETATE 40 MG/ML IJ SUSP
40.0000 mg | Freq: Once | INTRAMUSCULAR | Status: AC
  Administered 2021-09-10: 40 mg via INTRA_ARTICULAR

## 2021-09-10 NOTE — Patient Instructions (Signed)
Good to see you Please use ice as needed  I have made a referral to physical therapy   Please send me a message in MyChart with any questions or updates.  Please see me back in 4-6 weeks.   --Dr. Jordan Likes

## 2021-09-10 NOTE — Assessment & Plan Note (Signed)
Has done well since the injection. -Counseled on home exercise therapy and supportive care. -Referral to physical therapy.

## 2021-09-10 NOTE — Progress Notes (Signed)
  Debra West - 46 y.o. female MRN [id number]  Date of birth: 05/03/1975  SUBJECTIVE:  Including CC & ROS.  No chief complaint on file.   Debra West is a 46 y.o. female that is presenting with acute left elbow pain.  The pain is worse with gripping.  She did have a few episodes of trauma to this area.  It is similar to the right elbow pain that she experienced.    Review of Systems See HPI   HISTORY: Past Medical, Surgical, Social, and Family History Reviewed & Updated per EMR.   Pertinent Historical Findings include:  Past Medical History:  Diagnosis Date   Anxiety    Cardiac arrhythmia    Chronic headaches    Diabetes mellitus without complication ([place])    GERD (gastroesophageal reflux disease)    Hypothyroidism 01/17/2016   Runs in family   Left ovarian cyst 03/16/2014   Overview:  02/2014 2.3 x 2.1 03/15/14 4 x 4.1 septated labs pending started on OCP's   Migraine without aura and without status migrainosus, not intractable    Obstructive sleep apnea syndrome 11/27/2016   wears mouth guard   Pernicious anemia    Vasodepressor syncope    Vocal fold paresis, 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 No joint effusion.  Summary: Symptoms most consistent with lateral epicondylitis  Ultrasound and interpretation by Clearance Coots, MD  Aspiration/Injection Procedure Note Debra West [birth year]  Procedure: Injection Indications: Left elbow pain  Procedure Details Consent: Risks  of procedure as well as the alternatives and risks of each were explained to the (patient/caregiver).  Consent for procedure obtained. Time Out: Verified patient identification, verified procedure, site/side was marked, verified correct patient position, special equipment/implants available, medications/allergies/relevent history reviewed, required imaging and test results available.  Performed.  The area was cleaned with iodine and alcohol swabs.    The left lateral epicondyle was injected using 1 cc's of 40 mg Depo-Medrol and 2 cc's of 0.5% bupivacaine with a 25 1 1/2" needle.  Ultrasound was used. Images were obtained in long views showing the injection.     A sterile dressing was applied.  Patient did tolerate procedure well.     ASSESSMENT & PLAN:   Lateral epicondylitis of right elbow Has done well since the injection. -Counseled on home exercise therapy and supportive care. -Referral to physical therapy.  Lateral epicondylitis of left elbow Acutely occurring.  Feels similar to the right side.  She did well with previous injected to the contralateral side. -Counseled on home exercise therapy and supportive care. -Injection today. -Referral to physical therapy.

## 2021-09-10 NOTE — Assessment & Plan Note (Signed)
Acutely occurring.  Feels similar to the right side.  She did well with previous injected to the contralateral side. -Counseled on home exercise therapy and supportive care. -Injection today. -Referral to physical therapy.

## 2021-09-23 ENCOUNTER — Ambulatory Visit: Payer: 59 | Attending: Family Medicine | Admitting: Physical Therapy

## 2021-09-23 ENCOUNTER — Encounter: Payer: Self-pay | Admitting: Physical Therapy

## 2021-09-23 DIAGNOSIS — M62838 Other muscle spasm: Secondary | ICD-10-CM | POA: Insufficient documentation

## 2021-09-23 DIAGNOSIS — M7712 Lateral epicondylitis, left elbow: Secondary | ICD-10-CM | POA: Diagnosis not present

## 2021-09-23 DIAGNOSIS — M25521 Pain in right elbow: Secondary | ICD-10-CM | POA: Insufficient documentation

## 2021-09-23 DIAGNOSIS — M25622 Stiffness of left elbow, not elsewhere classified: Secondary | ICD-10-CM | POA: Insufficient documentation

## 2021-09-23 DIAGNOSIS — M25522 Pain in left elbow: Secondary | ICD-10-CM | POA: Insufficient documentation

## 2021-09-23 DIAGNOSIS — M7711 Lateral epicondylitis, right elbow: Secondary | ICD-10-CM | POA: Diagnosis not present

## 2021-09-23 DIAGNOSIS — M6281 Muscle weakness (generalized): Secondary | ICD-10-CM | POA: Diagnosis present
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
## 2021-09-25 ENCOUNTER — Ambulatory Visit: Payer: 59

## 2021-09-25 DIAGNOSIS — M25522 Pain in left elbow: Secondary | ICD-10-CM | POA: Diagnosis not present

## 2021-09-25 DIAGNOSIS — M25622 Stiffness of left elbow, not elsewhere classified: Secondary | ICD-10-CM

## 2021-09-25 DIAGNOSIS — M62838 Other muscle spasm: Secondary | ICD-10-CM

## 2021-09-25 DIAGNOSIS — M25521 Pain in right elbow: Secondary | ICD-10-CM

## 2021-09-25 DIAGNOSIS — M6281 Muscle weakness (generalized): Secondary | ICD-10-CM
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
## 2021-09-30 ENCOUNTER — Encounter: Payer: Self-pay | Admitting: Physical Therapy

## 2021-09-30 ENCOUNTER — Ambulatory Visit: Payer: 59 | Admitting: Physical Therapy

## 2021-09-30 DIAGNOSIS — M6281 Muscle weakness (generalized): Secondary | ICD-10-CM

## 2021-09-30 DIAGNOSIS — M25522 Pain in left elbow: Secondary | ICD-10-CM

## 2021-09-30 DIAGNOSIS — M25521 Pain in right elbow: Secondary | ICD-10-CM

## 2021-09-30 DIAGNOSIS — M25622 Stiffness of left elbow, not elsewhere classified: Secondary | ICD-10-CM

## 2021-09-30 DIAGNOSIS — M62838 Other muscle spasm: Secondary | ICD-10-CM

## 2021-10-03 ENCOUNTER — Encounter: Payer: Self-pay | Admitting: Physical Therapy

## 2021-10-03 ENCOUNTER — Ambulatory Visit: Payer: 59 | Admitting: Physical Therapy

## 2021-10-03 DIAGNOSIS — M25522 Pain in left elbow: Secondary | ICD-10-CM

## 2021-10-03 DIAGNOSIS — M25521 Pain in right elbow: Secondary | ICD-10-CM

## 2021-10-03 DIAGNOSIS — M62838 Other muscle spasm: Secondary | ICD-10-CM

## 2021-10-03 DIAGNOSIS — M6281 Muscle weakness (generalized): Secondary | ICD-10-CM

## 2021-10-03 DIAGNOSIS — M25622 Stiffness of left elbow, not elsewhere classified: Secondary | ICD-10-CM
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
## 2021-10-07 ENCOUNTER — Encounter: Payer: 59 | Admitting: Physical Therapy

## 2021-10-14 ENCOUNTER — Encounter: Payer: Self-pay | Admitting: Physical Therapy

## 2021-10-14 ENCOUNTER — Ambulatory Visit: Payer: 59 | Attending: Family Medicine | Admitting: Physical Therapy

## 2021-10-14 DIAGNOSIS — M25522 Pain in left elbow: Secondary | ICD-10-CM | POA: Diagnosis present

## 2021-10-14 DIAGNOSIS — M62838 Other muscle spasm: Secondary | ICD-10-CM | POA: Diagnosis present

## 2021-10-14 DIAGNOSIS — M6281 Muscle weakness (generalized): Secondary | ICD-10-CM

## 2021-10-14 DIAGNOSIS — M25521 Pain in right elbow: Secondary | ICD-10-CM | POA: Diagnosis present

## 2021-10-14 DIAGNOSIS — M25622 Stiffness of left elbow, not elsewhere classified: Secondary | ICD-10-CM
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
## 2021-10-28 ENCOUNTER — Ambulatory Visit: Payer: 59

## 2021-10-30 ENCOUNTER — Ambulatory Visit: Payer: 59

## 2021-10-30 DIAGNOSIS — M62838 Other muscle spasm: Secondary | ICD-10-CM

## 2021-10-30 DIAGNOSIS — M25522 Pain in left elbow: Secondary | ICD-10-CM | POA: Diagnosis not present

## 2021-10-30 DIAGNOSIS — M25622 Stiffness of left elbow, not elsewhere classified: Secondary | ICD-10-CM

## 2021-10-30 DIAGNOSIS — M25521 Pain in right elbow: Secondary | ICD-10-CM

## 2021-10-30 DIAGNOSIS — M6281 Muscle weakness (generalized): Secondary | ICD-10-CM
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
## 2021-11-04 ENCOUNTER — Encounter: Payer: Self-pay | Admitting: Physical Therapy

## 2021-11-04 ENCOUNTER — Ambulatory Visit: Payer: 59 | Admitting: Physical Therapy

## 2021-11-04 DIAGNOSIS — M25521 Pain in right elbow: Secondary | ICD-10-CM

## 2021-11-04 DIAGNOSIS — M25522 Pain in left elbow: Secondary | ICD-10-CM | POA: Diagnosis not present

## 2021-11-04 DIAGNOSIS — M6281 Muscle weakness (generalized): Secondary | ICD-10-CM

## 2021-11-04 DIAGNOSIS — M62838 Other muscle spasm: Secondary | ICD-10-CM

## 2021-11-04 DIAGNOSIS — M25622 Stiffness of left elbow, not elsewhere classified: Secondary | ICD-10-CM
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
## 2021-11-11 ENCOUNTER — Ambulatory Visit: Payer: 59 | Admitting: Family Medicine

## 2021-11-11 ENCOUNTER — Encounter: Payer: Self-pay | Admitting: Family Medicine

## 2021-11-11 VITALS — BP 124/88 | HR 88 | Ht 67.0 in | Wt 231.0 lb

## 2021-11-11 DIAGNOSIS — M7711 Lateral epicondylitis, right elbow: Secondary | ICD-10-CM

## 2021-11-11 DIAGNOSIS — M7712 Lateral epicondylitis, left elbow: Secondary | ICD-10-CM

## 2021-11-11 NOTE — Patient Instructions (Signed)
Good to see you I will call with the lab results.   Please send me a message in MyChart with any questions or updates.  Please see me back to start shockwave therapy.   --Dr. Jordan Likes

## 2021-11-11 NOTE — Assessment & Plan Note (Signed)
Acute on chronic in nature.  We have tried an injection.  She has done well with physical therapy.  Unclear as to the exact source of her pain.  Does have a history of elevated inflammatory markers in the past.  Does have a history of diabetes. -Counseled on home exercise therapy and supportive care. -ANA, sed rate, CRP and uric acid. -Pursue shockwave therapy.

## 2021-11-11 NOTE — Progress Notes (Signed)
  Debra West - 46 y.o. female MRN [id number]  Date of birth: [birth year]  SUBJECTIVE:  Including CC & ROS.  No chief complaint on file.   Debra West is a 46 y.o. female that is following up for her bilateral elbow pain.  She did well initially with the injections but the pain has subsequently returned.  Unable to identify a specific source of the pain.  She has been trying physical therapy with improvement.   Review of Systems See HPI   HISTORY: Past Medical, Surgical, Social, and Family History Reviewed & Updated per EMR.   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  We have tried an injection.  She has done well with physical therapy.  Unclear as to the exact source of her pain.  Does have a history of elevated inflammatory markers in the past.  Does have a history of diabetes. -Counseled on home  exercise therapy and supportive care. -ANA, sed rate, CRP and uric acid. -Pursue shockwave therapy.  Lateral epicondylitis of left elbow Acutely occurring.  Recently obtained compression for the left and right elbow. -Counseled on home exercise therapy and supportive care. -Pursue shockwave therapy.

## 2021-11-11 NOTE — Assessment & Plan Note (Signed)
Acutely occurring.  Recently obtained compression for the left and right elbow. -Counseled on home exercise therapy and supportive care. -Pursue shockwave therapy.

## 2021-11-12 ENCOUNTER — Ambulatory Visit: Payer: 59 | Attending: Family Medicine

## 2021-11-12 DIAGNOSIS — M62838 Other muscle spasm: Secondary | ICD-10-CM | POA: Insufficient documentation

## 2021-11-12 DIAGNOSIS — M6281 Muscle weakness (generalized): Secondary | ICD-10-CM | POA: Insufficient documentation

## 2021-11-12 DIAGNOSIS — M25622 Stiffness of left elbow, not elsewhere classified: Secondary | ICD-10-CM | POA: Insufficient documentation

## 2021-11-12 DIAGNOSIS — M25521 Pain in right elbow: Secondary | ICD-10-CM | POA: Diagnosis present

## 2021-11-12 DIAGNOSIS — M25522 Pain in left elbow: Secondary | ICD-10-CM | POA: Insufficient documentation
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
## 2021-11-13 ENCOUNTER — Encounter (INDEPENDENT_AMBULATORY_CARE_PROVIDER_SITE_OTHER): Payer: Self-pay

## 2021-11-14 ENCOUNTER — Ambulatory Visit: Payer: 59 | Admitting: Physical Therapy

## 2021-11-14 ENCOUNTER — Encounter: Payer: Self-pay | Admitting: Physical Therapy

## 2021-11-14 ENCOUNTER — Telehealth: Payer: Self-pay | Admitting: Family Medicine

## 2021-11-14 DIAGNOSIS — M62838 Other muscle spasm: Secondary | ICD-10-CM

## 2021-11-14 DIAGNOSIS — M6281 Muscle weakness (generalized): Secondary | ICD-10-CM

## 2021-11-14 DIAGNOSIS — M25522 Pain in left elbow: Secondary | ICD-10-CM

## 2021-11-14 DIAGNOSIS — D72829 Elevated white blood cell count, unspecified: Secondary | ICD-10-CM

## 2021-11-14 DIAGNOSIS — M25622 Stiffness of left elbow, not elsewhere classified: Secondary | ICD-10-CM

## 2021-11-14 DIAGNOSIS — M7711 Lateral epicondylitis, right elbow: Secondary | ICD-10-CM

## 2021-11-14 DIAGNOSIS — M25521 Pain in right elbow: Secondary | ICD-10-CM

## 2021-11-14 DIAGNOSIS — M7712 Lateral epicondylitis, left elbow: Secondary | ICD-10-CM

## 2021-11-14 LAB — SEDIMENTATION RATE: Sed Rate: 34 mm/hr — ABNORMAL HIGH (ref 0–32)

## 2021-11-14 LAB — ANA,IFA RA DIAG PNL W/RFLX TIT/PATN
ANA Titer 1: NEGATIVE
Cyclic Citrullin Peptide Ab: 3 units (ref 0–19)
Rheumatoid fact SerPl-aCnc: <10 IU/mL (ref <14.0)

## 2021-11-14 LAB — C-REACTIVE PROTEIN: CRP: 26 mg/L — ABNORMAL HIGH (ref 0–10)

## 2021-11-14 LAB — URIC ACID: Uric Acid: 5.1 mg/dL (ref 2.6–6.2)
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
## 2021-11-14 NOTE — Telephone Encounter (Signed)
Informed of results.  Has a nonspecific inflammatory marker elevation that has been consistent over 5 years.  She also has a leukocytosis that is been elevated with a normal ferritin.  Myra Rude, MD [facility] 11/14/2021, 1:52 PM

## 2021-11-19 ENCOUNTER — Encounter: Payer: 59 | Admitting: Neurology

## 2021-11-20 ENCOUNTER — Encounter: Payer: Self-pay | Admitting: Family Medicine

## 2021-11-20 ENCOUNTER — Ambulatory Visit: Payer: 59

## 2021-11-20 DIAGNOSIS — M25522 Pain in left elbow: Secondary | ICD-10-CM

## 2021-11-20 DIAGNOSIS — M6281 Muscle weakness (generalized): Secondary | ICD-10-CM

## 2021-11-20 DIAGNOSIS — M25622 Stiffness of left elbow, not elsewhere classified: Secondary | ICD-10-CM

## 2021-11-20 DIAGNOSIS — M25521 Pain in right elbow: Secondary | ICD-10-CM

## 2021-11-20 DIAGNOSIS — M62838 Other muscle spasm: Secondary | ICD-10-CM
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
## 2021-11-22 ENCOUNTER — Ambulatory Visit: Payer: 59 | Admitting: Physical Therapy

## 2021-11-22 ENCOUNTER — Encounter: Payer: Self-pay | Admitting: Physical Therapy

## 2021-11-22 DIAGNOSIS — M6281 Muscle weakness (generalized): Secondary | ICD-10-CM

## 2021-11-22 DIAGNOSIS — M25522 Pain in left elbow: Secondary | ICD-10-CM

## 2021-11-22 DIAGNOSIS — M62838 Other muscle spasm: Secondary | ICD-10-CM

## 2021-11-22 DIAGNOSIS — M25521 Pain in right elbow: Secondary | ICD-10-CM

## 2021-11-22 DIAGNOSIS — M25622 Stiffness of left elbow, not elsewhere classified: Secondary | ICD-10-CM
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
## 2021-11-25 ENCOUNTER — Encounter: Payer: Self-pay | Admitting: Physical Therapy

## 2021-11-25 ENCOUNTER — Ambulatory Visit: Payer: 59 | Admitting: Physical Therapy

## 2021-11-25 DIAGNOSIS — M25521 Pain in right elbow: Secondary | ICD-10-CM

## 2021-11-25 DIAGNOSIS — M25522 Pain in left elbow: Secondary | ICD-10-CM | POA: Diagnosis not present

## 2021-11-25 DIAGNOSIS — M62838 Other muscle spasm: Secondary | ICD-10-CM

## 2021-11-25 DIAGNOSIS — M6281 Muscle weakness (generalized): Secondary | ICD-10-CM
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
## 2021-11-28 ENCOUNTER — Encounter: Payer: Self-pay | Admitting: Physical Therapy

## 2021-11-28 ENCOUNTER — Ambulatory Visit: Payer: 59 | Admitting: Physical Therapy

## 2021-11-28 DIAGNOSIS — M62838 Other muscle spasm: Secondary | ICD-10-CM

## 2021-11-28 DIAGNOSIS — M25522 Pain in left elbow: Secondary | ICD-10-CM

## 2021-11-28 DIAGNOSIS — M6281 Muscle weakness (generalized): Secondary | ICD-10-CM

## 2021-11-28 DIAGNOSIS — M25521 Pain in right elbow: Secondary | ICD-10-CM
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
## 2021-11-29 ENCOUNTER — Telehealth: Payer: Self-pay | Admitting: Family Medicine

## 2021-11-29 ENCOUNTER — Encounter: Payer: Self-pay | Admitting: Family Medicine

## 2021-11-29 ENCOUNTER — Ambulatory Visit (INDEPENDENT_AMBULATORY_CARE_PROVIDER_SITE_OTHER): Payer: 59 | Admitting: Family Medicine

## 2021-11-29 VITALS — BP 108/62 | HR 75 | Temp 98.1°F | Ht 67.0 in | Wt 233.2 lb

## 2021-11-29 DIAGNOSIS — E039 Hypothyroidism, unspecified: Secondary | ICD-10-CM

## 2021-11-29 DIAGNOSIS — Z1211 Encounter for screening for malignant neoplasm of colon: Secondary | ICD-10-CM | POA: Diagnosis not present

## 2021-11-29 DIAGNOSIS — E1165 Type 2 diabetes mellitus with hyperglycemia: Secondary | ICD-10-CM

## 2021-11-29 DIAGNOSIS — Z0000 Encounter for general adult medical examination without abnormal findings: Secondary | ICD-10-CM

## 2021-11-29 LAB — MICROALBUMIN / CREATININE URINE RATIO
Creatinine,U: 80.3 mg/dL
Microalb Creat Ratio: 1.3 mg/g (ref 0.0–30.0)
Microalb, Ur: 1 mg/dL (ref 0.0–1.9)

## 2021-11-29 LAB — CBC
HCT: 39.4 % (ref 36.0–46.0)
Hemoglobin: 12.8 g/dL (ref 12.0–15.0)
MCHC: 32.6 g/dL (ref 30.0–36.0)
MCV: 84.9 fl (ref 78.0–100.0)
Platelets: 379 10*3/uL (ref 150.0–400.0)
RBC: 4.64 Mil/uL (ref 3.87–5.11)
RDW: 14.3 % (ref 11.5–15.5)
WBC: 10.8 10*3/uL — ABNORMAL HIGH (ref 4.0–10.5)

## 2021-11-29 LAB — COMPREHENSIVE METABOLIC PANEL
ALT: 18 U/L (ref 0–35)
AST: 19 U/L (ref 0–37)
Albumin: 4.4 g/dL (ref 3.5–5.2)
Alkaline Phosphatase: 47 U/L (ref 39–117)
BUN: 9 mg/dL (ref 6–23)
CO2: 28 mEq/L (ref 19–32)
Calcium: 9.6 mg/dL (ref 8.4–10.5)
Chloride: 99 mEq/L (ref 96–112)
Creatinine, Ser: 0.65 mg/dL (ref 0.40–1.20)
GFR: 104.96 mL/min (ref >60.00)
Glucose, Bld: 125 mg/dL — ABNORMAL HIGH (ref 70–99)
Potassium: 4.7 mEq/L (ref 3.5–5.1)
Sodium: 137 mEq/L (ref 135–145)
Total Bilirubin: 0.3 mg/dL (ref 0.2–1.2)
Total Protein: 7.2 g/dL (ref 6.0–8.3)

## 2021-11-29 LAB — LIPID PANEL
Cholesterol: 181 mg/dL (ref 0–200)
HDL: 44.3 mg/dL (ref >39.00)
LDL Cholesterol: 112 mg/dL — ABNORMAL HIGH (ref 0–99)
NonHDL: 137.08
Total CHOL/HDL Ratio: 4
Triglycerides: 123 mg/dL (ref 0.0–149.0)
VLDL: 24.6 mg/dL (ref 0.0–40.0)

## 2021-11-29 LAB — HEMOGLOBIN A1C: Hgb A1c MFr Bld: 7.4 % — ABNORMAL HIGH (ref 4.6–6.5)

## 2021-11-29 LAB — TSH: TSH: 4.22 u[IU]/mL (ref 0.35–5.50)

## 2021-11-29 LAB — T4, FREE: Free T4: 0.84 ng/dL (ref 0.60–1.60)

## 2021-11-29 MED ORDER — TIRZEPATIDE 7.5 MG/0.5ML ~~LOC~~ SOAJ: 7.5000 mg | SUBCUTANEOUS | 2 refills | Status: DC

## 2021-11-29 MED ORDER — TIRZEPATIDE 5 MG/0.5ML ~~LOC~~ SOAJ: 5.0000 mg | SUBCUTANEOUS | 0 refills | Status: DC

## 2021-11-29 MED ORDER — TIRZEPATIDE 2.5 MG/0.5ML ~~LOC~~ SOAJ: 2.5000 mg | SUBCUTANEOUS | 0 refills | Status: AC

## 2021-11-29 NOTE — Progress Notes (Signed)
Chief Complaint  Patient presents with   Annual Exam    Congestion today     Well Woman Debra West is here for a complete physical.   Her last physical was >1 year ago.  Current diet: in general, a "healthy" diet. Current exercise: walking, working in pool. Weight is stable and she denies fatigue out of ordinary. Seatbelt? Yes Advanced directive? No  Health Maintenance Pap/HPV- Yes Mammogram- Yes Tetanus- Yes Hep C screening- No HIV screening- Yes  DM II Sugars ranging 110-180's fasting. Diet/exercise as above. Compliant with metformin XR 1000 mg/d, no AE's. No hypoglycemia.   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     cyclobenzaprine (FLEXERIL) 5 MG tablet Take 1 tablet by mouth 2 (two) times daily.     FLUoxetine (PROZAC) 20 MG capsule TAKE 1 CAPSULE DAILY 90 capsule 3   [name] 1.5/30 1.5-30 MG-MCG tablet TAKE 1 TABLET DAILY 63 tablet 3   levothyroxine (SYNTHROID) 50 MCG tablet TAKE 1 TABLET DAILY 90 tablet 3   meloxicam (MOBIC) 15 MG tablet Take 1 tablet (15 mg total) by mouth daily. 30 tablet 0    metFORMIN (GLUCOPHAGE-XR) 500 MG 24 hr tablet Take 2 tablets (1,000 mg total) by mouth daily with breakfast. 180 tablet 2   midodrine (PROAMATINE) 5 MG tablet Take 1 tablet (5 mg total) by mouth 3 (three) times daily as needed (for low blood pressure (less than 105)). 15 tablet 1   omeprazole (PRILOSEC) 20 MG capsule Take 1 capsule (20 mg total) by mouth daily. 30 capsule 3   pregabalin (LYRICA) 25 MG capsule Take 1 capsule (25 mg total) by mouth 2 (two) times daily. 60 capsule 2   vitamin B-12 (CYANOCOBALAMIN) 1000 MCG tablet Take 1,000 mcg by mouth daily.     Allergies Allergies  Allergen Reactions   Depo-Medrol [Methylprednisolone Sodium Succ] Shortness Of Breath   Aspirin Nausea And Vomiting    Review of Systems: Constitutional:  no unexpected weight changes Eye:  no recent significant change in vision Ear/Nose/Mouth/Throat:  Ears:  no recent change in hearing Nose/Mouth/Throat:  no complaints of nasal congestion, no sore throat Cardiovascular: no chest pain Respiratory:  no shortness of breath Gastrointestinal:  no abdominal pain, no change in bowel habits GU:  Female: negative for dysuria or pelvic pain Musculoskeletal/Extremities:  no pain of the joints Integumentary (Skin/Breast):  no abnormal skin lesions reported Neurologic:  no headaches Endocrine:  denies fatigue Hematologic/Lymphatic:  No areas of easy bleeding  Exam BP  108/62   Pulse 75   Temp 98.1 F (36.7 C) (Oral)   Ht 5\' 7"  (1.702 m)   Wt 233 lb 4 oz (105.8 kg)   SpO2 97%   BMI 36.53 kg/m  General:  well developed, well nourished, in no apparent distress Skin:  no significant moles, warts, or growths Head:  no masses, lesions, or tenderness Eyes:  pupils equal and round, sclera anicteric without injection Ears:  canals without lesions, TMs shiny without retraction, no obvious effusion, no erythema Nose:  nares patent, septum midline, mucosa normal, and no drainage or sinus tenderness Throat/Pharynx:  lips  and gingiva without lesion; tongue and uvula midline; non-inflamed pharynx; no exudates or postnasal drainage Neck: neck supple without adenopathy, thyromegaly, or masses Lungs:  clear to auscultation, breath sounds equal bilaterally, no respiratory distress Cardio:  regular rate and rhythm, no LE edema Abdomen:  abdomen soft, nontender; bowel sounds normal; no masses or organomegaly Genital: Defer to GYN Musculoskeletal:  symmetrical muscle groups noted without atrophy or deformity Extremities:  no clubbing, cyanosis, or edema, no deformities, no skin discoloration Neuro:  gait normal; deep tendon reflexes normal and symmetric Psych: well oriented with normal range of affect and appropriate judgment/insight  Assessment and Plan  Well adult exam - Plan: CBC, Comprehensive metabolic panel, Lipid panel  Type 2 diabetes mellitus with hyperglycemia, without long-term current use of insulin (HCC) - Plan: Hemoglobin A1c, Microalbumin / creatinine urine ratio  Hypothyroidism, unspecified type - Plan: TSH, T4, free  Screen for colon cancer - Plan: Ambulatory referral to Gastroenterology   Well 46 y.o. female. Counseled on diet and exercise. CCS: Refer GI.  Other orders as above. Advanced directive form provided today.  DM: Chronic, uncontrolled. Cont Metformin XR 1000 mg bid. Add Mounjaro 2.5 mg/week for 4 doses, then increase to 5 mg/week for 4 doses and then 7.5 mg/week. She will let me know if there are cost issues and we will resort to Ozempic or Trulicity. This will also benefit her weight loss efforts. Will have to see if she needs more Crestor.  Follow up in 6 mo or prn. The patient voiced understanding and agreement to the plan.  57 Vass, DO 11/29/21 8:06 AM

## 2021-11-29 NOTE — Patient Instructions (Addendum)
Give [place] 2-3 business days to get the results of your labs back.   Keep the diet clean and stay active.  If the weekly shot is too expensive, don't fill it and let me know.   I recommend getting the flu shot in mid October. This suggestion would change if the CDC comes out with a different recommendation.   Please get me a copy of your advanced directive form at your convenience.   If you do not hear anything about your referral in the next 1-2 weeks, call our office and ask for an update.  Call Center for [facility] at [facility] at [phone] for an appointment.  They are located at [address], [place], [place], [address] (right across the hall from our office).  Foods that may reduce pain: 1) Ginger 2) Blueberries 3) Salmon 4) Pumpkin seeds 5) dark chocolate 6) turmeric 7) tart cherries 8) virgin olive oil 9) chilli peppers 10) mint 11) krill oil   Let us know if you need anything.

## 2021-11-29 NOTE — Telephone Encounter (Signed)
PA for [facility] was approved KEY:   [id number]

## 2021-12-02 ENCOUNTER — Other Ambulatory Visit: Payer: Self-pay | Admitting: Family Medicine

## 2021-12-02 ENCOUNTER — Ambulatory Visit: Payer: 59 | Admitting: Physical Therapy

## 2021-12-02 ENCOUNTER — Encounter: Payer: Self-pay | Admitting: Physical Therapy

## 2021-12-02 DIAGNOSIS — M25521 Pain in right elbow: Secondary | ICD-10-CM

## 2021-12-02 DIAGNOSIS — E1165 Type 2 diabetes mellitus with hyperglycemia: Secondary | ICD-10-CM

## 2021-12-02 DIAGNOSIS — M25522 Pain in left elbow: Secondary | ICD-10-CM

## 2021-12-02 DIAGNOSIS — M62838 Other muscle spasm: Secondary | ICD-10-CM

## 2021-12-02 DIAGNOSIS — M6281 Muscle weakness (generalized): Secondary | ICD-10-CM

## 2021-12-02 MED ORDER — ROSUVASTATIN CALCIUM 20 MG PO TABS: 20.0000 mg | ORAL_TABLET | Freq: Every day | ORAL | 3 refills | Status: DC
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
## 2021-12-05 ENCOUNTER — Encounter: Payer: Self-pay | Admitting: Physical Therapy

## 2021-12-05 ENCOUNTER — Ambulatory Visit: Payer: 59 | Admitting: Physical Therapy

## 2021-12-05 DIAGNOSIS — M25522 Pain in left elbow: Secondary | ICD-10-CM | POA: Diagnosis not present

## 2021-12-05 DIAGNOSIS — M25521 Pain in right elbow: Secondary | ICD-10-CM

## 2021-12-05 DIAGNOSIS — M6281 Muscle weakness (generalized): Secondary | ICD-10-CM

## 2021-12-05 DIAGNOSIS — M62838 Other muscle spasm: Secondary | ICD-10-CM
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
## 2021-12-10 ENCOUNTER — Other Ambulatory Visit: Payer: Self-pay | Admitting: Family Medicine

## 2021-12-10 DIAGNOSIS — E1165 Type 2 diabetes mellitus with hyperglycemia: Secondary | ICD-10-CM

## 2021-12-30 ENCOUNTER — Encounter: Payer: Self-pay | Admitting: Family Medicine

## 2021-12-30 ENCOUNTER — Ambulatory Visit (INDEPENDENT_AMBULATORY_CARE_PROVIDER_SITE_OTHER): Payer: 59 | Admitting: Family Medicine

## 2021-12-30 DIAGNOSIS — M7712 Lateral epicondylitis, left elbow: Secondary | ICD-10-CM

## 2021-12-30 DIAGNOSIS — M7711 Lateral epicondylitis, right elbow: Secondary | ICD-10-CM

## 2021-12-30 NOTE — Progress Notes (Signed)
  Debra West - 46 y.o. female MRN [id number]  Date of birth: Dec 28, 1975  SUBJECTIVE:  Including CC & ROS.  No chief complaint on file.   Debra West is a 46 y.o. female that is here for shockwave therapy.    Review of Systems See HPI   HISTORY: Past Medical, Surgical, Social, and Family History Reviewed & Updated per EMR.   Pertinent Historical Findings include:  Past Medical History:  Diagnosis Date   Anxiety    Cardiac arrhythmia    Chronic headaches    Diabetes mellitus without complication ([place])    GERD (gastroesophageal reflux disease)    Hypothyroidism 01/17/2016   Runs in family   Left ovarian cyst 03/16/2014   Overview:  02/2014 2.3 x 2.1 03/15/14 4 x 4.1 septated labs pending started on OCP's   Migraine without aura and without status migrainosus, not intractable    Obstructive sleep apnea syndrome 11/27/2016   wears mouth guard   Pernicious anemia    Vasodepressor syncope    Vocal fold paresis, 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  Consent for procedure obtained. Time Out: Verified patient identification, verified procedure, site/side was marked, verified correct patient position, special equipment/implants available, medications/allergies/relevent history reviewed, required imaging and test results available.  Performed.  The area was cleaned with iodine and alcohol swabs.    The left lateral epicondyle was  targeted for Extracorporeal shockwave therapy.   Preset: Lateral epicondylitis Power Level: 20 Frequency: 10 Impulse/cycles: 2000 Head size: Small Session: 1  Patient did tolerate procedure well.  ECSWT Note Debra West [birth year]  Procedure: ECSWT Indications: right elbow pain  Procedure Details Consent: Risks of procedure as well as the alternatives and risks of each were explained to the (patient/caregiver).  Consent for procedure obtained. Time Out: Verified patient identification, verified procedure, site/side was marked, verified correct patient position, special equipment/implants available, medications/allergies/relevent history reviewed, required imaging and test results available.  Performed.  The area was cleaned with iodine and alcohol swabs.    The right lateral epicondyle was targeted for Extracorporeal shockwave therapy.   Preset: Lateral epicondylitis Power Level: 40 Frequency: 10 Impulse/cycles: 2000 Head size: Small Session: 1  Patient did tolerate procedure well.  ASSESSMENT & PLAN:   Lateral epicondylitis of right elbow Completed shockwave therapy  Lateral epicondylitis of left elbow Completed shockwave therapy

## 2021-12-30 NOTE — Assessment & Plan Note (Signed)
Completed shockwave therapy  

## 2022-01-02 ENCOUNTER — Telehealth: Payer: Self-pay | Admitting: Family Medicine

## 2022-01-02 ENCOUNTER — Encounter: Payer: Self-pay | Admitting: Family Medicine

## 2022-01-02 NOTE — Telephone Encounter (Signed)
Pt called to follow up and was scheduled for appt

## 2022-01-02 NOTE — Telephone Encounter (Signed)
Pt called stating that she had just gotten done with another dr and they had told her that she has a bump on her neck around her thyroid. She is wondering what she needs to do regarding this.

## 2022-01-02 NOTE — Telephone Encounter (Signed)
Pt will need an appt.

## 2022-01-03 ENCOUNTER — Ambulatory Visit: Payer: 59 | Admitting: Family Medicine

## 2022-01-03 ENCOUNTER — Encounter: Payer: Self-pay | Admitting: Family Medicine

## 2022-01-03 VITALS — BP 120/84 | HR 92 | Temp 98.0°F | Ht 67.0 in | Wt 226.2 lb

## 2022-01-03 DIAGNOSIS — E039 Hypothyroidism, unspecified: Secondary | ICD-10-CM

## 2022-01-03 LAB — TSH: TSH: 4.05 u[IU]/mL (ref 0.35–5.50)

## 2022-01-03 LAB — T4, FREE: Free T4: 0.79 ng/dL (ref 0.60–1.60)

## 2022-01-03 NOTE — Progress Notes (Signed)
Chief Complaint  Patient presents with   Thyroid    Subjective: Patient is a 46 y.o. female here for f/u.  Patient has a history of hypothyroidism.  She was at a pain doctor who mentioned she had a goiter.  She has not noticed any changes in her neck, difficulty swallowing, or change in speech.  She takes levothyroxine 50 mcg daily.  She is compliant and reports no adverse effects.  Reports no unexplained weight changes, skin/hair changes, constipation/diarrhea, fatigue.  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  No thyroid bruit. No TTP Lungs: No accessory muscle use Psych: Age appropriate judgment and insight, normal affect and mood  Assessment and Plan: Hypothyroidism, unspecified type - Plan: TSH, T4, free  Chronic, stable.  Continue levothyroxine 50 mcg daily.  Check labs.  Offered thyroid ultrasound which she politely declined given lack of physical exam findings and symptoms.  Follow-up as originally scheduled. The patient voiced understanding and agreement to the plan.  [place],  DO 01/03/22  12:17 PM

## 2022-01-03 NOTE — Patient Instructions (Addendum)
Give [place] 2-3 business days to get the results of your labs back.   If you would like to get an ultrasound for peace of mind, send me a message.   Let us know if you need anything.

## 2022-01-10 ENCOUNTER — Other Ambulatory Visit: Payer: Self-pay | Admitting: Family Medicine

## 2022-01-10 ENCOUNTER — Encounter: Payer: Self-pay | Admitting: Gastroenterology

## 2022-01-13 ENCOUNTER — Other Ambulatory Visit: Payer: Self-pay | Admitting: Family Medicine

## 2022-01-13 DIAGNOSIS — N83202 Unspecified ovarian cyst, left side: Secondary | ICD-10-CM

## 2022-01-24 ENCOUNTER — Other Ambulatory Visit: Payer: Self-pay | Admitting: Family Medicine

## 2022-01-24 ENCOUNTER — Encounter: Payer: Self-pay | Admitting: Family Medicine

## 2022-01-24 MED ORDER — TIRZEPATIDE 12.5 MG/0.5ML ~~LOC~~ SOAJ: 12.5000 mg | SUBCUTANEOUS | 0 refills | Status: DC

## 2022-01-24 MED ORDER — TIRZEPATIDE 10 MG/0.5ML ~~LOC~~ SOAJ: 10.0000 mg | SUBCUTANEOUS | 0 refills | Status: DC

## 2022-01-24 MED ORDER — TIRZEPATIDE 15 MG/0.5ML ~~LOC~~ SOAJ: 15.0000 mg | SUBCUTANEOUS | 1 refills | Status: DC

## 2022-02-06 ENCOUNTER — Encounter: Payer: Self-pay | Admitting: Internal Medicine

## 2022-02-06 ENCOUNTER — Ambulatory Visit (INDEPENDENT_AMBULATORY_CARE_PROVIDER_SITE_OTHER): Payer: 59

## 2022-02-06 ENCOUNTER — Ambulatory Visit: Payer: 59 | Attending: Internal Medicine | Admitting: Internal Medicine

## 2022-02-06 VITALS — BP 118/85 | HR 83 | Resp 14 | Ht 67.0 in | Wt 218.6 lb

## 2022-02-06 DIAGNOSIS — M7712 Lateral epicondylitis, left elbow: Secondary | ICD-10-CM | POA: Diagnosis not present

## 2022-02-06 DIAGNOSIS — M7711 Lateral epicondylitis, right elbow: Secondary | ICD-10-CM

## 2022-02-06 DIAGNOSIS — R700 Elevated erythrocyte sedimentation rate: Secondary | ICD-10-CM

## 2022-02-06 DIAGNOSIS — M7918 Myalgia, other site: Secondary | ICD-10-CM
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
## 2022-02-07 ENCOUNTER — Emergency Department (HOSPITAL_BASED_OUTPATIENT_CLINIC_OR_DEPARTMENT_OTHER): Payer: 59

## 2022-02-07 ENCOUNTER — Encounter (HOSPITAL_BASED_OUTPATIENT_CLINIC_OR_DEPARTMENT_OTHER): Payer: Self-pay

## 2022-02-07 ENCOUNTER — Emergency Department (HOSPITAL_BASED_OUTPATIENT_CLINIC_OR_DEPARTMENT_OTHER)
Admission: EM | Admit: 2022-02-07 | Discharge: 2022-02-07 | Disposition: A | Discharge status: Home / Self Care | Payer: 59 | Attending: Emergency Medicine | Admitting: Emergency Medicine

## 2022-02-07 DIAGNOSIS — E039 Hypothyroidism, unspecified: Secondary | ICD-10-CM | POA: Insufficient documentation

## 2022-02-07 DIAGNOSIS — E1165 Type 2 diabetes mellitus with hyperglycemia: Secondary | ICD-10-CM | POA: Insufficient documentation

## 2022-02-07 DIAGNOSIS — Z7984 Long term (current) use of oral hypoglycemic drugs: Secondary | ICD-10-CM | POA: Insufficient documentation

## 2022-02-07 DIAGNOSIS — K5900 Constipation, unspecified: Secondary | ICD-10-CM | POA: Diagnosis present

## 2022-02-07 DIAGNOSIS — Z79899 Other long term (current) drug therapy: Secondary | ICD-10-CM | POA: Insufficient documentation

## 2022-02-07 DIAGNOSIS — K5641 Fecal impaction: Secondary | ICD-10-CM | POA: Insufficient documentation

## 2022-02-07 LAB — IGG, IGA, IGM
IgG (Immunoglobin G), Serum: 1283 mg/dL (ref 600–1640)
IgM, Serum: 49 mg/dL — ABNORMAL LOW (ref 50–300)
Immunoglobulin A: 77 mg/dL (ref 47–310)

## 2022-02-07 LAB — CBG MONITORING, ED: Glucose-Capillary: 140 mg/dL — ABNORMAL HIGH (ref 70–99)

## 2022-02-07 LAB — SEDIMENTATION RATE: Sed Rate: 46 mm/h — ABNORMAL HIGH (ref 0–20)

## 2022-02-07 LAB — PREGNANCY, URINE: Preg Test, Ur: NEGATIVE

## 2022-02-07 LAB — CK: Total CK: 65 U/L (ref 29–143)

## 2022-02-07 LAB — C-REACTIVE PROTEIN: CRP: 20.9 mg/L — ABNORMAL HIGH (ref <8.0)

## 2022-02-07 MED ORDER — FLEET ENEMA 7-19 GM/118ML RE ENEM: 1.0000 | ENEMA | Freq: Once | RECTAL | 0 refills | Status: AC

## 2022-02-07 MED ORDER — METAMUCIL SMOOTH TEXTURE 58.6 % PO POWD: 1.0000 | Freq: Three times a day (TID) | ORAL | 12 refills | Status: DC

## 2022-02-07 MED ORDER — MILK OF MAGNESIA 7.75 % PO SUSP: 15.0000 mL | Freq: Every day | ORAL | 0 refills | Status: DC | PRN

## 2022-02-07 MED ORDER — POLYETHYLENE GLYCOL 3350 17 G PO PACK: 17.0000 g | PACK | Freq: Every day | ORAL | 0 refills | Status: DC

## 2022-02-07 MED ORDER — FLEET ENEMA 7-19 GM/118ML RE ENEM
1.0000 | ENEMA | Freq: Once | RECTAL | Status: AC
  Administered 2022-02-07: 1 via RECTAL
  Filled 2022-02-07: qty 1

## 2022-02-07 NOTE — ED Provider Notes (Signed)
[facility] Provider Note   CSN: [id number] Arrival date & time: 02/07/22  0759     History  Chief Complaint  Patient presents with   Constipation    Debra West is a 46 y.o. female.  Patient is a 46 year old female with a past medical history of hypothyroidism and diabetes presenting to the emergency department with constipation.  The patient states that she has been constipated for the last week.  She states that every time she strains to go for a bowel movement she feels lightheaded like she might pass out.  She states that she is having some pain around her rectum but denies any abdominal pain, nausea or vomiting, fevers or chills.  She denies any chest pain or shortness of breath with her lightheadedness.  She states that she takes Dulcolax daily and over the last 3 days took another laxative but has had no improvement.  She states that she has not passed gas in the last day but she states when she does strain she feels some squirts of stool coming out.  She states that she has had constipation before but has never had it this severe.  She states that she has had an ovarian cyst removal and a cholecystectomy.  She denies any dysuria or hematuria.  The history is provided by the patient.  Constipation      Home Medications Prior to Admission medications   Medication Sig Start Date End Date Taking? Authorizing Provider  magnesium hydroxide (MILK OF MAGNESIA) 400 MG/5ML suspension Take 15 mLs by mouth daily as needed for mild constipation. 02/07/22  Yes Maylon Peppers, [place] K, DO  polyethylene glycol (MIRALAX) 17 g packet Take 17 g by mouth daily. 02/07/22  Yes Maylon Peppers, [place], DO  psyllium (METAMUCIL SMOOTH TEXTURE) 58.6 % powder Take 1 packet by mouth 3 (three) times daily. 02/07/22  Yes Maylon Peppers, [place] K, DO  sodium phosphate (FLEET) 7-19 GM/118ML ENEM Place 133 mLs (1 enema total) rectally once for 1 dose. 02/07/22 02/07/22 Yes Kingsley,  Jordan Hawks K, DO  acetaminophen (TYLENOL) 500 MG tablet Take by mouth. 07/14/18   [provider]  albuterol (VENTOLIN HFA) 108 (90 Base) MCG/ACT inhaler Inhale into the lungs.    [provider]  baclofen (LIORESAL) 10 MG tablet Take 1 tablet by mouth 2 (two) times daily. Patient not taking: Reported on 02/06/2022 01/18/21   [provider]  Calcium Carb-Cholecalciferol (OYSTER SHELL CALCIUM W/D) 500-5 MG-MCG TABS Take 1 tablet by mouth daily.    [provider]  cyclobenzaprine (FLEXERIL) 5 MG tablet Take 1 tablet by mouth as needed. 08/04/18   [provider]  FLUoxetine (PROZAC) 20 MG capsule TAKE 1 CAPSULE DAILY 05/13/21   Shelda Pal, DO  ibuprofen (ADVIL) 200 MG tablet 1 tablet with food or milk as needed Orally Three times a day    [provider]  levothyroxine (SYNTHROID) 50 MCG tablet TAKE 1 TABLET DAILY 05/13/21   Nani Ravens, Crosby Oyster, DO  meloxicam (MOBIC) 15 MG tablet Take 1 tablet (15 mg total) by mouth daily. Patient not taking: Reported on 02/06/2022 01/18/21   Shelda Pal, DO  metFORMIN (GLUCOPHAGE-XR) 500 MG 24 hr tablet TAKE 2 TABLETS BY MOUTH EVERY DAY WITH BREAKFAST 01/10/22   Wendling, Crosby Oyster, DO  midodrine (PROAMATINE) 5 MG tablet Take 1 tablet (5 mg total) by mouth 3 (three) times daily as needed (for low blood pressure (less than 105)). 03/09/19   Shelda Pal, DO  Norethindrone Acetate-Ethinyl Estradiol (LOESTRIN) 1.5-30 MG-MCG tablet TAKE 1 TABLET DAILY 01/13/22   Wendling, Crosby Oyster, DO  omeprazole (PRILOSEC) 20 MG capsule Take 1 capsule (20 mg total) by mouth daily. 03/09/19   Shelda Pal, DO  pregabalin (LYRICA) 25 MG capsule Take 1 capsule (25 mg total) by mouth 2 (two) times daily. Patient not taking: Reported on 02/06/2022 05/31/21   Shelda Pal, DO  rosuvastatin (CRESTOR) 20 MG tablet TAKE 1 TABLET BY MOUTH EVERY DAY 12/11/21   Shelda Pal, DO   tirzepatide [facility]) 10 MG/0.5ML Pen Inject 10 mg into the skin once a week for 28 days. 02/21/22 03/21/22  Shelda Pal, DO  tirzepatide [facility]) 12.5 MG/0.5ML Pen Inject 12.5 mg into the skin once a week for 28 days. 03/21/22 04/18/22  Shelda Pal, DO  tirzepatide [facility]) 15 MG/0.5ML Pen Inject 15 mg into the skin once a week. 04/18/22   Shelda Pal, DO  tirzepatide [facility]) 7.5 MG/0.5ML Pen Inject 7.5 mg into the skin once a week. 01/24/22   Shelda Pal, DO  vitamin B-12 (CYANOCOBALAMIN) 1000 MCG tablet Take 1,000 mcg by mouth daily.    [provider]      Allergies    Depo-medrol [methylprednisolone sodium succ], Raspberry, and Aspirin    Review of Systems   Review of Systems  Gastrointestinal:  Positive for constipation.    Physical Exam Updated Vital Signs BP 109/82   Pulse 78   Temp 97.9 F (36.6 C) (Oral)   Resp 14   Ht 5\' 7"  (1.702 m)   Wt 98.4 kg   LMP 09/12/2021 (Exact Date)   SpO2 98%   BMI 33.99 kg/m  Physical Exam Vitals and nursing note reviewed.  Constitutional:      General: She is not in acute distress.    Appearance: Normal appearance. She is obese.  HENT:     Head: Normocephalic and atraumatic.     Nose: Nose normal.     Mouth/Throat:     Mouth: Mucous membranes are moist.  Eyes:     Extraocular Movements: Extraocular movements intact.     Conjunctiva/sclera: Conjunctivae normal.  Cardiovascular:     Rate and Rhythm: Normal rate and regular rhythm.     Heart sounds: Normal heart sounds.  Pulmonary:     Effort: Pulmonary effort is normal.     Breath sounds: Normal breath sounds.  Abdominal:     General: Abdomen is flat. There is no distension.     Palpations: Abdomen is soft.     Tenderness: There is abdominal tenderness (Suprapubic). There is no right CVA tenderness, left CVA tenderness, guarding or rebound.  Musculoskeletal:        General: Normal range of motion.     Cervical  back: Normal range of motion and neck supple.  Skin:    General: Skin is warm and dry.  Neurological:     General: No focal deficit present.     Mental Status: She is alert and oriented to person, place, and time.  Psychiatric:        Mood and Affect: Mood normal.        Behavior: Behavior normal.     ED Results / Procedures / Treatments   Labs (all labs ordered are listed, but only abnormal results are displayed) Labs Reviewed  CBG MONITORING, ED - Abnormal; Notable for the following components:      Result Value   Glucose-Capillary 140 (*)  All other components within normal limits  PREGNANCY, URINE    EKG EKG Interpretation  Date/Time:  Friday February 07 2022 08:36:06 EDT Ventricular Rate:  80 PR Interval:  136 QRS Duration: 87 QT Interval:  397 QTC Calculation: 458 R Axis:   75 Text Interpretation: Sinus rhythm Low voltage, precordial leads No significant change since last tracing Confirmed by Leanord Asal ([id number]) on 02/07/2022 9:35:57 AM  Radiology DG Abd Portable 1 View  Result Date: 02/07/2022 CLINICAL DATA:  Constipation.  Evaluate for obstruction. EXAM: PORTABLE ABDOMEN - 1 VIEW COMPARISON:  05/19/2016 CT. FINDINGS: 2 supine views. Cholecystectomy clips. Non-obstructive bowel gas pattern. Right hemidiaphragm elevation. Moderate amount of stool within the rectum. No abnormal abdominal calcifications. No appendicolith. IMPRESSION: No acute findings. Electronically Signed   By: Abigail Miyamoto M.D.   On: 02/07/2022 09:47    Procedures Fecal disimpaction  Date/Time: 02/07/2022 12:10 PM  Performed by: Kemper Durie, DO Authorized by: Kemper Durie, DO  Consent: Verbal consent obtained. Risks and benefits: risks, benefits and alternatives were discussed Consent given by: patient Patient understanding: patient states understanding of the procedure being performed Patient identity confirmed: verbally with patient Local anesthesia used:  no  Anesthesia: Local anesthesia used: no  Sedation: Patient sedated: no  Patient tolerance: patient tolerated the procedure well with no immediate complications       Medications Ordered in ED Medications  sodium phosphate (FLEET) 7-19 GM/118ML enema 1 enema (1 enema Rectal Given 02/07/22 1033)    ED Course/ Medical Decision Making/ A&P Clinical Course as of 02/07/22 1220  Fri Feb 07, 2022  0955 Xray with non-obstructive bowel pattern and moderate amount of stool in the rectum. [VK]  [id number] Rectal exam was performed that showed no signs of cellulitis or abscess perirectally and no signs of fissures or hemorrhoids.  She did have brown stool in the vault.  Enema will be performed and she will be reassessed. [VK]  1147 Patient had no significant improvement with enema and is agreeable for disimpaction. [VK]    Clinical Course User Index [VK] Kemper Durie, DO                           Medical Decision Making This patient presents to the ED with chief complaint(s) of constipation with pertinent past medical history of hypothyroidism and DM which further complicates the presenting complaint. The complaint involves an extensive differential diagnosis and also carries with it a high risk of complications and morbidity.    The differential diagnosis includes constipation, stool impaction, SBO, ileus, patient has no urinary symptoms making a UTI less likely, considering possi rectal abscess or cellulitis contributing to the constipation  Additional history obtained: Additional history obtained from N/A Records reviewed N/A  ED Course and Reassessment: Upon patient's arrival to the emergency department she is awake and alert well-appearing in no acute distress.  Her abdomen does not feel distended but did report no bowel movement or gas in the last 24 hours and abdominal x-ray will be performed to evaluate for obstruction or ileus.  Pregnancy test will be performed to evaluate for  cause of her symptoms.  She will have EKG and Accu-Chek performed to evaluate for cause of her presyncopal symptoms.  Patient was offered disimpaction versus enema here versus home for her constipation and opted for an enema here as she is concerned about syncopized saying at home.  Independent labs interpretation:  The following labs were independently  interpreted: negative pregnancy  Independent visualization of imaging: - I independently visualized the following imaging with scope of interpretation limited to determining acute life threatening conditions related to emergency care: Abd XR, which revealed non-obstructive bowel pattern  Consultation: - Consulted or discussed management/test interpretation w/ external professional: N/A  Consideration for admission or further workup: Patient has no emergent conditions requiring admission or further work-up at this time and is stable for discharge with primary care follow-up Social Determinants of health: N/A    Amount and/or Complexity of Data Reviewed Labs: ordered. Radiology: ordered.  Risk OTC drugs.          Final Clinical Impression(s) / ED Diagnoses Final diagnoses:  Impacted stool in rectum (HCC)  Constipation, unspecified constipation type    Rx / DC Orders ED Discharge Orders          Ordered    sodium phosphate (FLEET) 7-19 GM/118ML ENEM   Once        02/07/22 1217    magnesium hydroxide (MILK OF MAGNESIA) 400 MG/5ML suspension  Daily PRN        02/07/22 1217    polyethylene glycol (MIRALAX) 17 g packet  Daily        02/07/22 1217    psyllium (METAMUCIL SMOOTH TEXTURE) 58.6 % powder  3 times daily        02/07/22 1217              Leanord Asal K, DO 02/07/22 1220

## 2022-02-07 NOTE — Discharge Instructions (Addendum)
You were seen in the emergency department for your constipation.  You had no signs of bowel obstruction but you did have a large stool ball in your rectum.  We gave you an enema here without significant improvement and did perform a fecal disimpaction to remove some of the stool in your rectum.  I have given you a prescription for milk of magnesia that you should take daily until you have a bowel movement and you can repeat an enema tomorrow if you have not had a bowel movement.  You can start taking MiraLAX daily until you are having daily soft bowel movements, then you can cut the dose in half until you are having daily soft bowel movements and then you can start taking as needed.  Have also given you a fiber supplement and you should take this daily. You should return to the emergency if you have significantly worsening abdominal pain, you have repetitive vomiting, you have a fever or if you have any other new or concerning symptoms.

## 2022-02-07 NOTE — ED Triage Notes (Signed)
Pt states that last Thursday she had her dose adjusted on Mounjaro and has had constipation and abd pain since. Pt states last BM 10/28

## 2022-02-10 ENCOUNTER — Ambulatory Visit: Payer: 59 | Admitting: Family Medicine

## 2022-02-10 ENCOUNTER — Encounter: Payer: Self-pay | Admitting: Family Medicine

## 2022-02-10 VITALS — BP 120/76 | HR 91 | Temp 98.9°F | Ht 67.0 in | Wt 215.4 lb

## 2022-02-10 DIAGNOSIS — K5900 Constipation, unspecified: Secondary | ICD-10-CM

## 2022-02-10 NOTE — Progress Notes (Signed)
Chief Complaint  Patient presents with   Follow-up    ER-- constipation    Subjective: Patient is a 46 y.o. female here for constipation/ER f/u.  Pt in process of escalating dosage of Mounjaro. The day after she started her 7.5 mg/week dose, she went to [place] for a week where her hydration and diet were not ideal. She started having constipation. It got to the point where she would nearly pass out while straining. She went to ED and was eventually manually disimpacted. She has constipation and some loose stools as well. Milk of Mag has been helpful. She is drinking 60+ oz of water daily.   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 No accessory muscle use Abd: Mild discomfort diffusely, BS+, ND Psych: Age appropriate judgment and insight, normal affect and mood  Assessment and Plan: Constipation, unspecified constipation type  Hydration encouraged. Cont fiber supp, Mg prn.  Cocktail of prune juice and milk of magnesia followed by a duplex suppository recommended if things get bad again.  Colonoscopy to follow next  month.  We may have to adjust her Mounjaro.  She will wait a few extra days before taking her next injection.  She will send me a message if she wishes to stay at the 7.5 or go down to 5 mg weekly again. She did mention she had some vaginal bleeding during this episode to make matters worse.  She has an appointment with her gynecologist in 2 weeks.  She was encouraged to bring this up during that appointment. The patient voiced understanding and agreement to the plan.  [place], DO 02/10/22  12:10 PM

## 2022-02-10 NOTE — Patient Instructions (Addendum)
Take Metamucil or Benefiber daily.  Try to drink at least 55-60 oz of water daily outside of exercise.  If things get bad again, try 2 tablespoons of milk of mag in 4 oz of warm prune juice. Do that and wait a couple hours. If no improvement, try a Dulcolax suppository and then let me know if we are still having issues.   Wait a few days after you're set for your next shot as this could help you ease back into normal bowel movements. Send me a message as soon as you think you may not want to escalate to 10 mg weekly.   Let us know if you need anything.

## 2022-02-17 ENCOUNTER — Other Ambulatory Visit: Payer: Self-pay

## 2022-02-17 ENCOUNTER — Other Ambulatory Visit: Payer: Self-pay | Admitting: Family Medicine

## 2022-02-17 ENCOUNTER — Ambulatory Visit (AMBULATORY_SURGERY_CENTER): Payer: 59

## 2022-02-17 VITALS — Ht 67.0 in | Wt 217.0 lb

## 2022-02-17 DIAGNOSIS — Z1211 Encounter for screening for malignant neoplasm of colon: Secondary | ICD-10-CM

## 2022-02-17 MED ORDER — TIRZEPATIDE 7.5 MG/0.5ML ~~LOC~~ SOAJ: 7.5000 mg | SUBCUTANEOUS | 0 refills | Status: DC

## 2022-02-17 MED ORDER — NA SULFATE-K SULFATE-MG SULF 17.5-3.13-1.6 GM/177ML PO SOLN: 1.0000 | Freq: Once | ORAL | 0 refills | Status: AC

## 2022-02-17 NOTE — Progress Notes (Signed)
Denies allergies to eggs or soy products. Denies complication of anesthesia or sedation. Denies use of weight loss medication. Denies use of O2.   Emmi instructions given for colonoscopy.  

## 2022-02-26 ENCOUNTER — Ambulatory Visit (INDEPENDENT_AMBULATORY_CARE_PROVIDER_SITE_OTHER): Payer: 59 | Admitting: Obstetrics & Gynecology

## 2022-02-26 ENCOUNTER — Other Ambulatory Visit (HOSPITAL_COMMUNITY)
Admission: RE | Admit: 2022-02-26 | Discharge: 2022-02-26 | Disposition: A | Discharge status: Home / Self Care | Payer: 59 | Source: Ambulatory Visit | Attending: Obstetrics & Gynecology | Admitting: Obstetrics & Gynecology

## 2022-02-26 ENCOUNTER — Encounter: Payer: Self-pay | Admitting: Obstetrics & Gynecology

## 2022-02-26 VITALS — BP 113/70 | HR 85 | Ht 67.0 in | Wt 215.0 lb

## 2022-02-26 DIAGNOSIS — Z01419 Encounter for gynecological examination (general) (routine) without abnormal findings: Secondary | ICD-10-CM

## 2022-02-26 DIAGNOSIS — N841 Polyp of cervix uteri: Secondary | ICD-10-CM

## 2022-02-26 DIAGNOSIS — Z3009 Encounter for other general counseling and advice on contraception: Secondary | ICD-10-CM

## 2022-02-26 DIAGNOSIS — N939 Abnormal uterine and vaginal bleeding, unspecified: Secondary | ICD-10-CM

## 2022-02-26 DIAGNOSIS — Z8742 Personal history of other diseases of the female genital tract: Secondary | ICD-10-CM

## 2022-02-26 NOTE — Progress Notes (Signed)
Subjective:     Debra West is a 46 y.o. female here for a routine exam. Pt known to me from prev care. She reports that she has not been seen due to COVID and has had mutil health issues and surgeries since she was last seen by me. She was dx'd with DM but, has now lost >75#s and her HgbA1c is WNL. Current complaints: pt reports GI sx with new DM med. She had a stool impaction and following disimpaction, she noted vaginal bleeding. She had not seen bleeding in several years. She is on OCPs. No issues noted.      Gynecologic History Patient's last menstrual period was 02/08/2022 (exact date). Contraception: OCP (estrogen/progesterone) Last Pap: 03/27/2017. Results were:  Satisfactory for evaluation  endocervical/transformation zone component PRESENT. Abnormal    Diagnosis ATYPICAL SQUAMOUS CELLS OF UNDETERMINED SIGNIFICANCE (ASC-US). Abnormal   HPV NOT DETECTED  Comment: Normal Reference Range - NOT Detected  Material Submitted CervicoVaginal Pap [ThinPrep Imaged] Abnormal    Last mammogram: 01/09/2022. Results were: normal  Obstetric History OB History  Gravida Para Term Preterm AB Living  0 0 0 0 0 0  SAB IAB Ectopic Multiple Live Births  0 0 0 0 0     The following portions of the patient's history were reviewed and updated as appropriate: allergies, current medications, past family history, past medical history, past social history, past surgical history, and problem list.  Review of Systems Pertinent items are noted in HPI.    Objective:  BP 113/70   Pulse 85   Ht 5\' 7"  (1.702 m)   Wt 215 lb (97.5 kg)   LMP 02/08/2022 (Exact Date)   BMI 33.67 kg/m  General Appearance:    Alert, cooperative, no distress, appears stated age  Head:    Normocephalic, without obvious abnormality, atraumatic  Eyes:    conjunctiva/corneas clear, EOM's intact, both eyes  Ears:    Normal external ear canals, both ears  Nose:   Nares normal, septum midline, mucosa normal, no drainage    or  sinus tenderness  Throat:   Lips, mucosa, and tongue normal; teeth and gums normal  Neck:   Supple, symmetrical, trachea midline, no adenopathy;    thyroid:  no enlargement/tenderness/nodules  Back:     Symmetric, no curvature, ROM normal, no CVA tenderness  Lungs:     respirations unlabored  Chest Wall:    No tenderness or deformity   Heart:    Regular rate and rhythm  Breast Exam:    No tenderness, masses, or nipple abnormality  Abdomen:     Soft, non-tender, bowel sounds active all four quadrants,    no masses, no organomegaly  Genitalia:    Normal female without lesion, discharge or tenderness   Procedure: Pt was placed in the lithotomy position. A speculum was placed in the vagina. The above finding were noted. Using a ringed forceps the polyp was grasped and removed in its entirety from the base. Silver nitrate was then used to cauterize the foundation.  There were no complications. The specimen was sent for pathology evaluation.   Extremities:   Extremities normal, atraumatic, no cyanosis or edema  Pulses:   2+ and symmetric all extremities  Skin:   Skin color, texture, turgor normal, no rashes or lesions       Assessment:    Healthy female exam.  H/o    Plan:  Debra West was seen today for annual exam.  Diagnoses and all orders for this visit:  Well female exam with routine gynecological exam -     Cytology - PAP( [place])  Encounter for counseling regarding contraception  Cervical polyp -     Surgical pathology  Abnormal uterine bleeding (AUB) -     US PELVIS TRANSVAGINAL NON-OB (TV ONLY); Future  History of abnormal cervical Pap smear   Keep OCPs- LoEstrin 1.5/30 1 po q day   F/u in 1 year or sooner prn  Will review results via MyChart.   Teren Zurcher L. Harraway-Smith, M.D., [place]

## 2022-03-04 LAB — SURGICAL PATHOLOGY

## 2022-03-07 ENCOUNTER — Encounter: Payer: Self-pay | Admitting: Family Medicine

## 2022-03-07 ENCOUNTER — Ambulatory Visit (HOSPITAL_BASED_OUTPATIENT_CLINIC_OR_DEPARTMENT_OTHER): Payer: 59

## 2022-03-07 ENCOUNTER — Ambulatory Visit: Payer: 59 | Admitting: Family Medicine

## 2022-03-07 ENCOUNTER — Ambulatory Visit (HOSPITAL_BASED_OUTPATIENT_CLINIC_OR_DEPARTMENT_OTHER)
Admission: RE | Admit: 2022-03-07 | Discharge: 2022-03-07 | Disposition: A | Discharge status: Home / Self Care | Payer: 59 | Source: Ambulatory Visit | Attending: Obstetrics & Gynecology | Admitting: Obstetrics & Gynecology

## 2022-03-07 VITALS — BP 120/82 | HR 91 | Temp 99.3°F | Ht 67.0 in | Wt 213.0 lb

## 2022-03-07 DIAGNOSIS — K5900 Constipation, unspecified: Secondary | ICD-10-CM | POA: Diagnosis not present

## 2022-03-07 DIAGNOSIS — N939 Abnormal uterine and vaginal bleeding, unspecified: Secondary | ICD-10-CM | POA: Insufficient documentation

## 2022-03-07 DIAGNOSIS — E1165 Type 2 diabetes mellitus with hyperglycemia: Secondary | ICD-10-CM

## 2022-03-07 LAB — CYTOLOGY - PAP
Comment: NEGATIVE
Diagnosis: UNDETERMINED — AB
High risk HPV: NEGATIVE

## 2022-03-07 LAB — HEMOGLOBIN A1C: Hgb A1c MFr Bld: 6.5 % (ref 4.6–6.5)

## 2022-03-07 MED ORDER — ROSUVASTATIN CALCIUM 20 MG PO TABS: 20.0000 mg | ORAL_TABLET | Freq: Every day | ORAL | 3 refills | Status: DC

## 2022-03-07 MED ORDER — TIRZEPATIDE 7.5 MG/0.5ML ~~LOC~~ SOAJ: 7.5000 mg | SUBCUTANEOUS | 1 refills | Status: DC

## 2022-03-07 NOTE — Patient Instructions (Addendum)
Let me know if you want to drop down on the [facility] dosage, let me know.   Give [place] 2-3 business days to get the results of your labs back.   Keep the diet clean and stay active.  Let us know if you need anything.

## 2022-03-07 NOTE — Progress Notes (Signed)
Chief Complaint  Patient presents with   Follow-up    Subjective: Patient is a 46 y.o. female here for f/u.  Patient is currently taking Mounjaro 7.5 mg weekly.  She is having issues with constipation.  She has been taking over-the-counter laxatives and fiber supplements.  This has not been particularly helpful.  She is still passing small amounts of stool and gas.  She has no nausea or vomiting.  She has a colonoscopy scheduled next week.  She would like to see how her prep goes and how her bowel movements rebound after the procedure.  Her sugars have been running in the low 100s.  Diet has been better.  She denies any low readings.  She is also compliant with metformin XR 1000 mg daily.  Past Medical History:  Diagnosis Date   Allergy    Anxiety    Arthritis    Cardiac arrhythmia    Chronic headaches    Depression    Diabetes mellitus without complication (HCC)    GERD (gastroesophageal reflux disease)    Hypothyroidism 01/17/2016   Runs in family   Left ovarian cyst 03/16/2014   Overview:  02/2014 2.3 x 2.1 03/15/14 4 x 4.1 septated labs pending started on OCP's   Migraine without aura and without status migrainosus, not intractable    Obstructive sleep apnea syndrome 11/27/2016   wears mouth guard   Pernicious anemia    Sleep apnea    Vasodepressor syncope    Vocal fold paresis, 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 No accessory muscle use Abdomen: Bowel sounds present, soft, mild tenderness diffusely, mild distention also noted Psych: Age appropriate judgment and insight, normal affect and mood  Assessment and Plan: Constipation, unspecified  constipation type  Type 2 diabetes mellitus with hyperglycemia, without long-term current use of insulin (HCC) - Plan: rosuvastatin (CRESTOR) 20 MG tablet, Hemoglobin A1c  Probably adverse effect from medication.  She will let me know how she does and if she would like to restart the 5 mg Mounjaro dosage. Chronic, unsure if controlled.  Check A1c today.  For now, continue Mounjaro 7.5 mg weekly and metformin XR 1000 mg daily. The patient voiced understanding and agreement to the plan.  13/07/2021 [place], DO 03/07/22  9:09 AM

## 2022-03-11 ENCOUNTER — Encounter: Payer: Self-pay | Admitting: Gastroenterology

## 2022-03-14 ENCOUNTER — Ambulatory Visit (AMBULATORY_SURGERY_CENTER): Payer: 59 | Admitting: Gastroenterology

## 2022-03-14 ENCOUNTER — Encounter: Payer: Self-pay | Admitting: Gastroenterology

## 2022-03-14 VITALS — BP 130/87 | HR 73 | Temp 98.2°F | Resp 17 | Ht 67.0 in | Wt 217.0 lb

## 2022-03-14 DIAGNOSIS — Z1211 Encounter for screening for malignant neoplasm of colon: Secondary | ICD-10-CM

## 2022-03-14 MED ORDER — SODIUM CHLORIDE 0.9 % IV SOLN: 500.0000 mL | Freq: Once | INTRAVENOUS | Status: DC

## 2022-03-14 NOTE — Progress Notes (Unsigned)
GASTROENTEROLOGY PROCEDURE H&P NOTE   Primary Care Physician: Sharlene Dory, DO  HPI: Debra West is a 46 y.o. female who presents for Colonoscopy for screening.  Past Medical History:  Diagnosis Date   Allergy    Anxiety    Arthritis    Cardiac arrhythmia    Chronic headaches    Depression    Diabetes mellitus without complication (HCC)    GERD (gastroesophageal reflux disease)    Hypothyroidism 01/17/2016   Runs in family   Left ovarian cyst 03/16/2014   Overview:  02/2014 2.3 x 2.1 03/15/14 4 x 4.1 septated labs pending started on OCP's   Migraine without aura and without status migrainosus, not intractable    Obstructive sleep apnea syndrome 11/27/2016   wears mouth guard   Pernicious anemia    Sleep apnea    Vasodepressor syncope    Vocal fold paresis, 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     Calcium Carb-Cholecalciferol (OYSTER SHELL CALCIUM W/D) 500-5 MG-MCG TABS Take 1 tablet by mouth daily.     Cholecalciferol (VITAMIN D-3) 125 MCG (5000 UT) TABS Take by mouth.     cyclobenzaprine (FLEXERIL) 5 MG tablet Take 1 tablet by mouth as needed.     FLUoxetine (PROZAC) 20 MG capsule TAKE 1 CAPSULE DAILY 90 capsule 3   levothyroxine (SYNTHROID) 50 MCG tablet TAKE 1 TABLET DAILY 90 tablet 3   metFORMIN (GLUCOPHAGE-XR) 500 MG 24 hr tablet TAKE 2 TABLETS BY MOUTH EVERY DAY WITH BREAKFAST 60 tablet 3   Norethindrone Acetate-Ethinyl Estradiol (LOESTRIN) 1.5-30 MG-MCG tablet TAKE 1 TABLET DAILY 63 tablet 3   polyethylene glycol (MIRALAX) 17 g packet Take 17 g by mouth daily. 14 each 0   rosuvastatin (CRESTOR) 20 MG tablet Take 1 tablet (20 mg total) by mouth daily. 90 tablet 3    [START ON 03/19/2022] tirzepatide (MOUNJARO) 7.5 MG/0.5ML Pen Inject 7.5 mg into the skin once a week. 2 mL 1   vitamin B-12 (CYANOCOBALAMIN) 1000 MCG tablet Take 1,000 mcg by mouth daily.     albuterol (VENTOLIN HFA) 108 (90 Base) MCG/ACT inhaler Inhale into the lungs.     ibuprofen (ADVIL) 200 MG tablet 1 tablet with food or milk as needed Orally Three times a day     magnesium hydroxide (MILK OF MAGNESIA) 400 MG/5ML suspension Take 15 mLs by mouth daily as needed for mild constipation. 355 mL 0   meloxicam (MOBIC) 15 MG tablet Take 1 tablet (15 mg total) by mouth daily. 30 tablet 0   midodrine (PROAMATINE) 5 MG tablet Take 1 tablet (5 mg total) by mouth 3 (three) times daily as needed (for low blood pressure (less than 105)). 15 tablet 1   omeprazole (PRILOSEC) 20 MG capsule Take 1 capsule (20 mg total) by mouth daily. 30 capsule 3   Current Facility-Administered Medications  Medication Dose Route Frequency Provider Last Rate Last Admin   0.9 %  sodium chloride infusion  500 mL Intravenous Once Mansouraty, Netty Starring., MD        Current Outpatient Medications:    acetaminophen (TYLENOL) 500 MG tablet, Take by mouth., Disp: , Rfl:  Calcium Carb-Cholecalciferol (OYSTER SHELL CALCIUM W/D) 500-5 MG-MCG TABS, Take 1 tablet by mouth daily., Disp: , Rfl:    Cholecalciferol (VITAMIN D-3) 125 MCG (5000 UT) TABS, Take by mouth., Disp: , Rfl:    cyclobenzaprine (FLEXERIL) 5 MG tablet, Take 1 tablet by mouth as needed., Disp: , Rfl:    FLUoxetine (PROZAC) 20 MG capsule, TAKE 1 CAPSULE DAILY, Disp: 90 capsule, Rfl: 3   levothyroxine (SYNTHROID) 50 MCG tablet, TAKE 1 TABLET DAILY, Disp: 90 tablet, Rfl: 3   metFORMIN (GLUCOPHAGE-XR) 500 MG 24 hr tablet, TAKE 2 TABLETS BY MOUTH EVERY DAY WITH BREAKFAST, Disp: 60 tablet, Rfl: 3   Norethindrone Acetate-Ethinyl Estradiol (LOESTRIN) 1.5-30 MG-MCG tablet, TAKE 1 TABLET DAILY, Disp: 63 tablet, Rfl: 3   polyethylene glycol (MIRALAX) 17 g packet, Take 17 g by  mouth daily., Disp: 14 each, Rfl: 0   rosuvastatin (CRESTOR) 20 MG tablet, Take 1 tablet (20 mg total) by mouth daily., Disp: 90 tablet, Rfl: 3   [START ON 03/19/2022] tirzepatide (MOUNJARO) 7.5 MG/0.5ML Pen, Inject 7.5 mg into the skin once a week., Disp: 2 mL, Rfl: 1   vitamin B-12 (CYANOCOBALAMIN) 1000 MCG tablet, Take 1,000 mcg by mouth daily., Disp: , Rfl:    albuterol (VENTOLIN HFA) 108 (90 Base) MCG/ACT inhaler, Inhale into the lungs., Disp: , Rfl:    ibuprofen (ADVIL) 200 MG tablet, 1 tablet with food or milk as needed Orally Three times a day, Disp: , Rfl:    magnesium hydroxide (MILK OF MAGNESIA) 400 MG/5ML suspension, Take 15 mLs by mouth daily as needed for mild constipation., Disp: 355 mL, Rfl: 0   meloxicam (MOBIC) 15 MG tablet, Take 1 tablet (15 mg total) by mouth daily., Disp: 30 tablet, Rfl: 0   midodrine (PROAMATINE) 5 MG tablet, Take 1 tablet (5 mg total) by mouth 3 (three) times daily as needed (for low blood pressure (less than 105))., Disp: 15 tablet, Rfl: 1   omeprazole (PRILOSEC) 20 MG capsule, Take 1 capsule (20 mg total) by mouth daily., Disp: 30 capsule, Rfl: 3  Current Facility-Administered Medications:    0.9 %  sodium chloride infusion, 500 mL, Intravenous, Once, Mansouraty, Netty Starring., MD Allergies  Allergen Reactions   Depo-Medrol [Methylprednisolone Sodium Succ] Shortness Of Breath   Raspberry Hives    AROUND NECK   Aspirin Nausea And Vomiting   Family History  Problem Relation Age of Onset   Diabetes Mother    Thyroid disease Mother    Hyperlipidemia Mother    Pernicious anemia Mother    Heart disease Father    Stroke Father    Heart attack Father    Thyroid disease Sister    Breast cancer Paternal Aunt    Bladder Cancer Paternal Aunt    Thyroid disease Paternal Aunt    Cancer Paternal Aunt    Lung cancer Maternal Grandfather    Heart disease Maternal Grandfather    Diabetes Paternal Grandmother    Thyroid cancer Paternal Grandmother    Heart  disease Paternal Grandfather    Colon cancer Neg Hx    Esophageal cancer Neg Hx    Rectal cancer Neg Hx    Stomach cancer Neg Hx    Social History   Socioeconomic History   Marital status: Single    Spouse name: Not on file   Number of children: 0   Years of education: Not on file   Highest education level: Not on file  Occupational History   Occupation: Chiropodist  Tobacco Use  Smoking status: Never    Passive exposure: Past   Smokeless tobacco: Never  Vaping Use   Vaping Use: Never used  Substance and Sexual Activity   Alcohol use: Yes    Comment: Occasionally   Drug use: No   Sexual activity: Never    Birth control/protection: Pill  Other Topics Concern   Not on file  Social History Narrative   Moved from [place]. Prefers alternative medicine, but open to traditional medicine.       Right Handed    Lives in a apartment on the floor level.   Social Determinants of Health   Financial Resource Strain: Not on file  Food Insecurity: Not on file  Transportation Needs: Not on file  Physical Activity: Not on file  Stress: Not on file  Social Connections: Not on file  Intimate Partner Violence: Not on file    Physical Exam: Today's Vitals   03/14/22 1344  BP: 112/76  Pulse: 82  Temp: 98.2 F (36.8 C)  TempSrc: Temporal  SpO2: 97%  Weight: 217 lb (98.4 kg)  Height: 5\' 7"  (1.702 m)   Body mass index is 33.99 kg/m. GEN: NAD EYE: Sclerae anicteric ENT: MMM CV: Non-tachycardic GI: Soft, NT/ND NEURO:  Alert & Oriented x 3  Lab Results: No results for input(s): "WBC", "HGB", "HCT", "PLT" in the last 72 hours. BMET No results for input(s): "NA", "K", "CL", "CO2", "GLUCOSE", "BUN", "CREATININE", "CALCIUM" in the last 72 hours. LFT No results for input(s): "PROT", "ALBUMIN", "AST", "ALT", "ALKPHOS", "BILITOT", "BILIDIR", "IBILI" in the last 72 hours. PT/INR No results for input(s): "LABPROT", "INR" in the last 72 hours.   Impression / Plan: This is a  46 y.o.female who presents for Colonoscopy for screening.  The risks and benefits of endoscopic evaluation/treatment were discussed with the patient and/or family; these include but are not limited to the risk of perforation, infection, bleeding, missed lesions, lack of diagnosis, severe illness requiring hospitalization, as well as anesthesia and sedation related illnesses.  The patient's history has been reviewed, patient examined, no change in status, and deemed stable for procedure.  The patient and/or family is agreeable to proceed.    57, MD [facility] # [place]

## 2022-03-14 NOTE — Progress Notes (Unsigned)
Sedate, gd SR, tolerated procedure well, VSS, report to RN 

## 2022-03-14 NOTE — Op Note (Signed)
[facility] Patient Name: Debra West Procedure Date: 03/14/2022 3:53 PM MRN: [id number] Endoscopist: Justice Britain , MD, [id number] Age: 46 Referring MD:  Date of Birth: 1975/08/07 Gender: Female Account #: [id number] Procedure:                Colonoscopy Indications:              Screening for colorectal malignant neoplasm, This                            is the patient's first colonoscopy Medicines:                Monitored Anesthesia Care Procedure:                Pre-Anesthesia Assessment:                           - Prior to the procedure, a History and Physical                            was performed, and patient medications and                            allergies were reviewed. The patient's tolerance of                            previous anesthesia was also reviewed. The risks                            and benefits of the procedure and the sedation                            options and risks were discussed with the patient.                            All questions were answered, and informed consent                            was obtained. Prior Anticoagulants: The patient has                            taken no anticoagulant or antiplatelet agents                            except for NSAID medication. ASA Grade Assessment:                            II - A patient with mild systemic disease. After                            reviewing the risks and benefits, the patient was                            deemed in satisfactory condition to undergo the  procedure.                           After obtaining informed consent, the colonoscope                            was passed under direct vision. Throughout the                            procedure, the patient's blood pressure, pulse, and                            oxygen saturations were monitored continuously. The                            CF [id number] #[id number] was introduced  through the anus                            and advanced to the 5 cm into the ileum. The                            colonoscopy was performed without difficulty. The                            patient tolerated the procedure. The quality of the                            bowel preparation was good. The terminal ileum,                            ileocecal valve, appendiceal orifice, and rectum                            were photographed. Scope In: 4:12:13 PM Scope Out: 4:23:31 PM Scope Withdrawal Time: 0 hours 7 minutes 54 seconds  Total Procedure Duration: 0 hours 11 minutes 18 seconds  Findings:                 The digital rectal exam findings include                            hemorrhoids. Pertinent negatives include no                            palpable rectal lesions.                           The terminal ileum and ileocecal valve appeared                            normal.                           Multiple small-mouthed diverticula were found in  the entire colon.                           Normal mucosa was found in the entire colon.                           Non-bleeding non-thrombosed external and internal                            hemorrhoids were found during retroflexion, during                            perianal exam and during digital exam. The                            hemorrhoids were Grade II (internal hemorrhoids                            that prolapse but reduce spontaneously). Complications:            No immediate complications. Estimated Blood Loss:     Estimated blood loss: none. Impression:               - Hemorrhoids found on digital rectal exam.                           - The examined portion of the ileum was normal.                           - Diverticulosis in the entire examined colon.                           - Normal mucosa in the entire examined colon.                           - Non-bleeding non-thrombosed external and  internal                            hemorrhoids. Recommendation:           - The patient will be observed post-procedure,                            until all discharge criteria are met.                           - Discharge patient to home.                           - Patient has a contact number available for                            emergencies. The signs and symptoms of potential                            delayed complications were discussed with the  patient. Return to normal activities tomorrow.                            Written discharge instructions were provided to the                            patient.                           - High fiber diet.                           - Use FiberCon 1-2 tablets PO daily.                           - Continue present medications.                           - Repeat colonoscopy in 10 years for screening                            purposes.                           - The findings and recommendations were discussed                            with the patient.                           - The findings and recommendations were discussed                            with the patient's family. Justice Britain, MD 03/14/2022 4:27:39 PM

## 2022-03-14 NOTE — Progress Notes (Signed)
Pt's states no medical or surgical changes since previsit or office visit. 

## 2022-03-14 NOTE — Progress Notes (Signed)
Repeat BS 72.  Pt. Awake, alert, skin warm and dry, pt. Ready to go and get something to eat.  No S/S hypoglycemia.

## 2022-03-14 NOTE — Patient Instructions (Addendum)
Impression/Recommendations:  Hemorrhoid, high fiber, and diverticulosis handouts given to patient.  Use FiberCon 1-2 tablets by mouth daily.  Continue present medications.  Repeat colonoscopy in 10 years for screening purposes.  YOU HAD AN ENDOSCOPIC PROCEDURE TODAY AT THE [facility]:   Refer to the procedure report that was given to you for any specific questions about what was found during the examination.  If the procedure report does not answer your questions, please call your gastroenterologist to clarify.  If you requested that your care partner not be given the details of your procedure findings, then the procedure report has been included in a sealed envelope for you to review at your convenience later.  YOU SHOULD EXPECT: Some feelings of bloating in the abdomen. Passage of more gas than usual.  Walking can help get rid of the air that was put into your GI tract during the procedure and reduce the bloating. If you had a lower endoscopy (such as a colonoscopy or flexible sigmoidoscopy) you may notice spotting of blood in your stool or on the toilet paper. If you underwent a bowel prep for your procedure, you may not have a normal bowel movement for a few days.  Please Note:  You might notice some irritation and congestion in your nose or some drainage.  This is from the oxygen used during your procedure.  There is no need for concern and it should clear up in a day or so.  SYMPTOMS TO REPORT IMMEDIATELY:  Following lower endoscopy (colonoscopy or flexible sigmoidoscopy):  Excessive amounts of blood in the stool  Significant tenderness or worsening of abdominal pains  Swelling of the abdomen that is new, acute  Fever of 100F or higher For urgent or emergent issues, a gastroenterologist can be reached at any hour by calling [phone]. Do not use MyChart messaging for urgent concerns.    DIET:  We do recommend a small meal at first, but then you may proceed to your  regular diet.  Drink plenty of fluids but you should avoid alcoholic beverages for 24 hours.  ACTIVITY:  You should plan to take it easy for the rest of today and you should NOT DRIVE or use heavy machinery until tomorrow (because of the sedation medicines used during the test).    FOLLOW UP: Our staff will call the number listed on your records the next business day following your procedure.  We will call around 7:15- 8:00 am to check on you and address any questions or concerns that you may have regarding the information given to you following your procedure. If we do not reach you, we will leave a message.     If any biopsies were taken you will be contacted by phone or by letter within the next 1-3 weeks.  Please call us at [phone] if you have not heard about the biopsies in 3 weeks.    SIGNATURES/CONFIDENTIALITY: You and/or your care partner have signed paperwork which will be entered into your electronic medical record.  These signatures attest to the fact that that the information above on your After Visit Summary has been reviewed and is understood.  Full responsibility of the confidentiality of this discharge information lies with you and/or your care-partner.

## 2022-03-17 ENCOUNTER — Telehealth: Payer: Self-pay

## 2022-03-17 NOTE — Telephone Encounter (Signed)
  Follow up Call-     03/14/2022    1:48 PM  Call back number  Post procedure Call Back phone  # [phone]  Permission to leave phone message Yes     Patient questions:  Do you have a fever, pain , or abdominal swelling? Yes.   Pain Score  1 *  Have you tolerated food without any problems? Yes.    Have you been able to return to your normal activities? Yes.    Do you have any questions about your discharge instructions: Diet   No. Medications  No. Follow up visit  No.  Do you have questions or concerns about your Care? No.  Actions: * If pain score is 4 or above: No action needed, pain <4.  Patient states she is doing well, but still having some "crazy gas pains at times".  Informed patient she can try OTC gas-x for relief. SChaplin, RN,BSN

## 2022-03-20 ENCOUNTER — Encounter: Payer: Self-pay | Admitting: Family Medicine

## 2022-03-20 ENCOUNTER — Ambulatory Visit (INDEPENDENT_AMBULATORY_CARE_PROVIDER_SITE_OTHER): Payer: 59 | Admitting: Family Medicine

## 2022-03-20 ENCOUNTER — Ambulatory Visit: Payer: Self-pay

## 2022-03-20 VITALS — BP 120/80 | Ht 67.0 in | Wt 217.0 lb

## 2022-03-20 DIAGNOSIS — M7712 Lateral epicondylitis, left elbow: Secondary | ICD-10-CM

## 2022-03-20 MED ORDER — METHYLPREDNISOLONE ACETATE 40 MG/ML IJ SUSP
40.0000 mg | Freq: Once | INTRAMUSCULAR | Status: AC
  Administered 2022-03-20: 40 mg via INTRA_ARTICULAR

## 2022-03-20 NOTE — Patient Instructions (Signed)
Good to see you Please use ice as needed  Please continue the exercises   Please send me a message in MyChart with any questions or updates.  Please see me back in 4-6 weeks.   --Dr. Tyjah Hai  

## 2022-03-20 NOTE — Progress Notes (Signed)
  Debra West - 46 y.o. female MRN [id number]  Date of birth: 1975-12-17  SUBJECTIVE:  Including CC & ROS.  No chief complaint on file.   Debra West is a 46 y.o. female that is  presenting with acute left elbow pain. Pain is localized to the lateral elbow. No injury. No redness or swelling.    Review of Systems See HPI   HISTORY: Past Medical, Surgical, Social, and Family History Reviewed & Updated per EMR.   Pertinent Historical Findings include:  Past Medical History:  Diagnosis Date   Allergy    Anxiety    Arthritis    Cardiac arrhythmia    Chronic headaches    Depression    Diabetes mellitus without complication (HCC)    GERD (gastroesophageal reflux disease)    Hypothyroidism 01/17/2016   Runs in family   Left ovarian cyst 03/16/2014   Overview:  02/2014 2.3 x 2.1 03/15/14 4 x 4.1 septated labs pending started on OCP's   Migraine without aura and without status migrainosus, not intractable    Obstructive sleep apnea syndrome 11/27/2016   wears mouth guard   Pernicious anemia    Sleep apnea    Vasodepressor syncope    Vocal fold paresis, 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    Past Surgical History:  Procedure Laterality Date   BREAST BIOPSY Right    CHOLECYSTECTOMY  2004   OVARIAN CYST REMOVAL Left    WISDOM TOOTH EXTRACTION     WRIST SURGERY Left    3 x     PHYSICAL EXAM:  VS: BP 120/80 (BP Location: Right Arm, Patient Position: Sitting)   Ht 5\' 7"  (1.702 m)   Wt 217 lb (98.4 kg)   BMI 33.99 kg/m  Physical Exam Gen: NAD, alert, cooperative with exam, well-appearing MSK:  Neurovascularly intact    Limited ultrasound: left elbow pain:  Hypoechoic change with the common extensor tendon  Increased hyperemia of the origin at the lateral epicondyle   Summary: findings consistent with lateral epicondylitis   Ultrasound and interpretation by , MD  Injection/Aspiration Clare Gandy 04-08-1975  Procedure: Injection Indications: left elbow  pain  Procedure Details Consent: Risks of procedure as well as the alternatives and risks of each were explained to the (patient/caregiver).  Consent for procedure obtained. Time Out: Verified patient identification, verified procedure, site/side was marked, verified correct patient position, special equipment/implants available, medications/allergies/relevent history reviewed, required imaging and test results available.  Performed.  The area was cleaned with iodine and alcohol swabs.   The left elbow lateral epicondyle was injected using 1 cc's of 40 mg Depo-Medrol and 4 cc's of 0.25% bupivacaine was injected.  Ultrasound was used. Images were obtained in long views showing the injection.     A sterile dressing was applied.  Patient did tolerate procedure well.    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 Having changes at the lateral epicondyle  - counseled on home exercise therapy and supportive care - injection today  - could consider further imaging.

## 2022-03-20 NOTE — Assessment & Plan Note (Signed)
Acute on chronic in nature. Having changes at the lateral epicondyle  - counseled on home exercise therapy and supportive care - injection today  - could consider further imaging.

## 2022-03-26 ENCOUNTER — Encounter: Payer: Self-pay | Admitting: Internal Medicine

## 2022-04-10 ENCOUNTER — Other Ambulatory Visit: Payer: Self-pay | Admitting: Family Medicine

## 2022-04-14 ENCOUNTER — Encounter: Payer: Self-pay | Admitting: Family Medicine

## 2022-04-14 DIAGNOSIS — E1165 Type 2 diabetes mellitus with hyperglycemia: Secondary | ICD-10-CM

## 2022-04-15 ENCOUNTER — Other Ambulatory Visit: Payer: Self-pay | Admitting: Family Medicine

## 2022-04-15 MED ORDER — TIRZEPATIDE 7.5 MG/0.5ML ~~LOC~~ SOAJ: 7.5000 mg | SUBCUTANEOUS | 5 refills | Status: DC

## 2022-04-15 MED ORDER — ROSUVASTATIN CALCIUM 20 MG PO TABS: 20.0000 mg | ORAL_TABLET | Freq: Every day | ORAL | 3 refills | Status: DC

## 2022-05-05 ENCOUNTER — Ambulatory Visit: Payer: 59 | Admitting: Family Medicine

## 2022-05-05 ENCOUNTER — Ambulatory Visit (HOSPITAL_BASED_OUTPATIENT_CLINIC_OR_DEPARTMENT_OTHER)
Admission: RE | Admit: 2022-05-05 | Discharge: 2022-05-05 | Disposition: A | Discharge status: Home / Self Care | Payer: 59 | Source: Ambulatory Visit | Attending: Family Medicine | Admitting: Family Medicine

## 2022-05-05 ENCOUNTER — Ambulatory Visit: Payer: Self-pay

## 2022-05-05 ENCOUNTER — Encounter: Payer: Self-pay | Admitting: Family Medicine

## 2022-05-05 VITALS — BP 126/82 | Ht 67.0 in | Wt 200.0 lb

## 2022-05-05 DIAGNOSIS — G540 Brachial plexus disorders: Secondary | ICD-10-CM | POA: Insufficient documentation

## 2022-05-05 DIAGNOSIS — M7711 Lateral epicondylitis, right elbow: Secondary | ICD-10-CM

## 2022-05-05 MED ORDER — METHYLPREDNISOLONE ACETATE 40 MG/ML IJ SUSP
40.0000 mg | Freq: Once | INTRAMUSCULAR | Status: AC
  Administered 2022-05-05: 40 mg via INTRAMUSCULAR

## 2022-05-05 NOTE — Assessment & Plan Note (Signed)
Acute on chronic in nature.  Having exacerbation of her tennis elbow type pain.  Has done well with her previous injection. -Counseled on home exercise therapy and supportive care. - injection

## 2022-05-05 NOTE — Patient Instructions (Signed)
Good to see you Please use ice as needed  We'll call with the xray results.  We'll get the MRi approved and you'll call to schedule it.   Please send me a message in MyChart with any questions or updates.  We'll setup a virtual visit one the MRi is resulted.   --Dr. Raeford Razor

## 2022-05-05 NOTE — Progress Notes (Signed)
Debra West - 47 y.o. female MRN [id number]  Date of birth: September 15, 1975  SUBJECTIVE:  Including CC & ROS.  No chief complaint on file.   Debra West is a 47 y.o. female that is presenting with acute right elbow pain and acute on chronic right arm pain.  She has experienced pain from the mid clavicle distally down the arm.  She has Ultracin associated weakness of the right hand.  This occurs intermittently.  She has had a previous nerve study that was inconclusive as to a nerve source of her pain.  She has tried physical therapy on 2 different occasions with limited success. .    Review of Systems See HPI   HISTORY: Past Medical, Surgical, Social, and Family History Reviewed & Updated per EMR.   Pertinent Historical Findings include:  Past Medical History:  Diagnosis Date   Allergy    Anxiety    Arthritis    Cardiac arrhythmia    Chronic headaches    Depression    Diabetes mellitus without complication ([place])    GERD (gastroesophageal reflux disease)    Hypothyroidism 01/17/2016   Runs in family   Left ovarian cyst 03/16/2014   Overview:  02/2014 2.3 x 2.1 03/15/14 4 x 4.1 septated labs pending started on OCP's   Migraine without aura and without status migrainosus, not intractable    Obstructive sleep apnea syndrome 11/27/2016   wears mouth guard   Pernicious anemia    Sleep apnea    Vasodepressor syncope    Vocal fold paresis, 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 Diminished deep tendon reflexes in the right arm  compared to the left. Weakened grip strength compared to the left side Neurovascularly intact    Limited ultrasound: Right elbow pain:  No hyperemia appreciated of the origin of the common extensor tendon.  There is spurring appreciated at the lateral epicondyle  Summary: Findings consistent with acute on chronic left epicondylitis  Ultrasound and interpretation by Clearance Coots, MD   Injection/Aspiration Sol Passer [birth year]  Procedure: Injection Indications: right elbow pain  Procedure Details Consent: Risks of procedure as well as the alternatives and risks of each were explained to the (patient/caregiver).  Consent for procedure obtained. Time Out: Verified patient identification, verified procedure, site/side was marked, verified correct patient position, special equipment/implants available, medications/allergies/relevent history reviewed, required imaging and test results available.  Performed.  The area was cleaned with iodine and alcohol swabs.   The right lateral epicondyle was injected using 1 cc's of 40 mg Depo-Medrol and 2 cc's of 0.25% bupivacaine was injected.  Ultrasound was used. Images were obtained in long views showing the injection.     A sterile dressing was applied.  Patient did tolerate procedure well.    ASSESSMENT & PLAN:   Lateral epicondylitis of right elbow Acute on chronic in nature.  Having exacerbation of her tennis elbow type pain.  Has done well with her previous  injection. -Counseled on home exercise therapy and supportive care. - injection   Brachial plexopathy Acute on chronic in nature.  Her symptoms been ongoing for greater than 6 months.  She has tried 2 episodes of physical therapy with limited success.  Previous imaging has been unrevealing.  An EMG was unrevealing. -Counseled on home exercise therapy and supportive care. - chest xray  -MRI of the chest to evaluate for the brachial plexopathy and for presurgical  planning

## 2022-05-05 NOTE — Assessment & Plan Note (Signed)
Acute on chronic in nature.  Her symptoms been ongoing for greater than 6 months.  She has tried 2 episodes of physical therapy with limited success.  Previous imaging has been unrevealing.  An EMG was unrevealing. -Counseled on home exercise therapy and supportive care. - chest xray  -MRI of the chest to evaluate for the brachial plexopathy and for presurgical planning

## 2022-05-06 ENCOUNTER — Telehealth: Payer: Self-pay | Admitting: Family Medicine

## 2022-05-06 NOTE — Telephone Encounter (Signed)
Left VM for patient. If she calls back please have her speak with a nurse/CMA and inform that her xray is normal and we will proceed with the MRi.   If any questions then please take the best time and phone number to call and I will try to call her back.   Rosemarie Ax, MD [facility] 05/06/2022, 8:34 AM

## 2022-05-07 ENCOUNTER — Other Ambulatory Visit: Payer: Self-pay | Admitting: Family Medicine

## 2022-05-07 DIAGNOSIS — E039 Hypothyroidism, unspecified: Secondary | ICD-10-CM

## 2022-05-07 DIAGNOSIS — F411 Generalized anxiety disorder: Secondary | ICD-10-CM

## 2022-05-24 ENCOUNTER — Other Ambulatory Visit: Payer: 59

## 2022-06-06 ENCOUNTER — Ambulatory Visit
Admission: RE | Admit: 2022-06-06 | Discharge: 2022-06-06 | Disposition: A | Discharge status: Home / Self Care | Payer: 59 | Source: Ambulatory Visit | Attending: Family Medicine | Admitting: Family Medicine

## 2022-06-06 DIAGNOSIS — G540 Brachial plexus disorders: Secondary | ICD-10-CM

## 2022-06-06 MED ORDER — GADOPICLENOL 0.5 MMOL/ML IV SOLN
9.0000 mL | Freq: Once | INTRAVENOUS | Status: AC | PRN
  Administered 2022-06-06: 9 mL via INTRAVENOUS

## 2022-06-11 ENCOUNTER — Telehealth (INDEPENDENT_AMBULATORY_CARE_PROVIDER_SITE_OTHER): Payer: 59 | Admitting: Family Medicine

## 2022-06-11 ENCOUNTER — Encounter: Payer: Self-pay | Admitting: Family Medicine

## 2022-06-11 VITALS — Ht 67.0 in | Wt 200.0 lb

## 2022-06-11 DIAGNOSIS — M5412 Radiculopathy, cervical region: Secondary | ICD-10-CM | POA: Diagnosis not present

## 2022-06-11 NOTE — Assessment & Plan Note (Signed)
MRI of the chest was not revealing for a thoracic outlet syndrome.  Symptoms appear more consistent with a radicular pain with bulging of the disc appreciated of the cervical spine. -Counseled on home exercise therapy and supportive care. -Pursue epidural.

## 2022-06-11 NOTE — Progress Notes (Signed)
Virtual Visit via Video Note  I connected with Debra West on 06/11/22 at  8:00 AM EST by a video enabled telemedicine application and verified that I am speaking with the correct person using two identifiers.  Location: Patient: office Provider: office   I discussed the limitations of evaluation and management by telemedicine and the availability of in person appointments. The patient expressed understanding and agreed to proceed.  History of Present Illness:  Debra West is a 47 year old female is following up after the MRI of her chest.  This was not demonstrating any structural abnormalities to suggest thoracic outlet syndrome.  She does continue to have neck pain with right-sided arm and elbow pain.   Observations/Objective:   Assessment and Plan:  Cervical radiculopathy: MRI of the chest was not revealing for a thoracic outlet syndrome.  Symptoms appear more consistent with a radicular pain with bulging of the disc appreciated of the cervical spine. -Counseled on home exercise therapy and supportive care. -Pursue epidural.  Follow Up Instructions:    I discussed the assessment and treatment plan with the patient. The patient was provided an opportunity to ask questions and all were answered. The patient agreed with the plan and demonstrated an understanding of the instructions.   The patient was advised to call back or seek an in-person evaluation if the symptoms worsen or if the condition fails to improve as anticipated.    Clearance Coots, MD

## 2022-06-19 ENCOUNTER — Encounter: Payer: Self-pay | Admitting: Family Medicine

## 2022-06-30 ENCOUNTER — Ambulatory Visit
Admission: RE | Admit: 2022-06-30 | Discharge: 2022-06-30 | Disposition: A | Discharge status: Home / Self Care | Payer: 59 | Source: Ambulatory Visit | Attending: Family Medicine | Admitting: Family Medicine

## 2022-06-30 DIAGNOSIS — M5412 Radiculopathy, cervical region: Secondary | ICD-10-CM

## 2022-06-30 MED ORDER — IOPAMIDOL (ISOVUE-M 300) INJECTION 61%
1.0000 mL | Freq: Once | INTRAMUSCULAR | Status: AC
  Administered 2022-06-30: 1 mL via EPIDURAL

## 2022-06-30 MED ORDER — TRIAMCINOLONE ACETONIDE 40 MG/ML IJ SUSP (RADIOLOGY)
80.0000 mg | Freq: Once | INTRAMUSCULAR | Status: AC
  Administered 2022-06-30: 80 mg via EPIDURAL

## 2022-06-30 NOTE — Discharge Instructions (Signed)

## 2022-07-21 ENCOUNTER — Encounter: Payer: Self-pay | Admitting: *Deleted

## 2022-09-12 ENCOUNTER — Ambulatory Visit: Payer: 59 | Admitting: Family Medicine

## 2022-09-12 ENCOUNTER — Encounter: Payer: Self-pay | Admitting: Family Medicine

## 2022-09-12 ENCOUNTER — Other Ambulatory Visit: Payer: Self-pay | Admitting: Family Medicine

## 2022-09-12 VITALS — BP 118/62 | HR 69 | Temp 98.0°F | Ht 67.0 in | Wt 196.5 lb

## 2022-09-12 DIAGNOSIS — Z7985 Long-term (current) use of injectable non-insulin antidiabetic drugs: Secondary | ICD-10-CM

## 2022-09-12 DIAGNOSIS — Z7984 Long term (current) use of oral hypoglycemic drugs: Secondary | ICD-10-CM | POA: Diagnosis not present

## 2022-09-12 DIAGNOSIS — E039 Hypothyroidism, unspecified: Secondary | ICD-10-CM | POA: Diagnosis not present

## 2022-09-12 DIAGNOSIS — E1165 Type 2 diabetes mellitus with hyperglycemia: Secondary | ICD-10-CM | POA: Diagnosis not present

## 2022-09-12 LAB — COMPREHENSIVE METABOLIC PANEL
ALT: 10 U/L (ref 0–35)
AST: 16 U/L (ref 0–37)
Albumin: 4.2 g/dL (ref 3.5–5.2)
Alkaline Phosphatase: 43 U/L (ref 39–117)
BUN: 9 mg/dL (ref 6–23)
CO2: 25 mEq/L (ref 19–32)
Calcium: 9.6 mg/dL (ref 8.4–10.5)
Chloride: 101 mEq/L (ref 96–112)
Creatinine, Ser: 0.67 mg/dL (ref 0.40–1.20)
GFR: 103.63 mL/min (ref >60.00)
Glucose, Bld: 115 mg/dL — ABNORMAL HIGH (ref 70–99)
Potassium: 4.4 mEq/L (ref 3.5–5.1)
Sodium: 138 mEq/L (ref 135–145)
Total Bilirubin: 0.4 mg/dL (ref 0.2–1.2)
Total Protein: 7 g/dL (ref 6.0–8.3)

## 2022-09-12 LAB — LIPID PANEL
Cholesterol: 99 mg/dL (ref 0–200)
HDL: 39.8 mg/dL (ref >39.00)
LDL Cholesterol: 33 mg/dL (ref 0–99)
NonHDL: 59.63
Total CHOL/HDL Ratio: 2
Triglycerides: 134 mg/dL (ref 0.0–149.0)
VLDL: 26.8 mg/dL (ref 0.0–40.0)

## 2022-09-12 LAB — MICROALBUMIN / CREATININE URINE RATIO
Creatinine,U: 192.4 mg/dL
Microalb Creat Ratio: 0.6 mg/g (ref 0.0–30.0)
Microalb, Ur: 1.1 mg/dL (ref 0.0–1.9)

## 2022-09-12 LAB — T4, FREE: Free T4: 0.76 ng/dL (ref 0.60–1.60)

## 2022-09-12 LAB — TSH: TSH: 2.97 u[IU]/mL (ref 0.35–5.50)

## 2022-09-12 LAB — HEMOGLOBIN A1C: Hgb A1c MFr Bld: 6.3 % (ref 4.6–6.5)

## 2022-09-12 MED ORDER — METFORMIN HCL ER 500 MG PO TB24: ORAL_TABLET | ORAL | 3 refills | Status: DC

## 2022-09-12 NOTE — Patient Instructions (Addendum)
Give [place] 2-3 business days to get the results of your labs back.   Keep the diet clean and stay active.  Let us know if you need anything.  Fish oil 3 grams daily for 10 days then 2 grams daily  Vitamin D 4000 IU daily  CoQ10 200mg  daily for headaches  Tart cherry extract any dose at night  To help improve COGNITIVE function: Using fish oil/omega 3 that is 1000 mg (or roughly 600 mg EPA/DHA), starting as soon as possible after concussion, take: 3 tabs THREE TIMES a day  for the first 3 days, then (you will smell a little, sorry) 3 tabs TWICE DAILY  for the next 3 days, then 3 tabs ONCE DAILY  for the next 10 days    To help reduce HEADACHES: Coenzyme Q10 160mg  ONCE DAILY Riboflavin/Vitamin B2 400mg  ONCE DAILY Magnesium oxide 400 mg ONE-TWO TIMES DAILY May stop after headaches are resolved.                                                                                               To help with INSOMNIA: Melatonin 3-5mg  AT BEDTIME Tart cherry extract, any dose at night    Other medicines to help decrease inflammation Alpha Lipoic Acid 100mg  TWICE DAILY Turmeric 500mg  twice daily Iron 65mg  elemental daily Vitamin D 4000 IU daily for 2 weeks then 2000 IU daily thereafter.

## 2022-09-12 NOTE — Progress Notes (Signed)
Subjective:   Chief Complaint  Patient presents with   Follow-up    6 month     Debra West is a 47 y.o. female here for follow-up of diabetes.   Debra West does not routinely monitor her sugars.  Patient does not require insulin.   Medications include: Mounjaro 7.5 mg/week, Metformin XR 1000 mg bid.  She is on Crestor 20 mg/d.  Diet is healthy overall.  Exercise: none No Cp or SOB.   Hypothyroidism Patient presents for follow-up of hypothyroidism.  Reports compliance with medication. Current symptoms include: denies fatigue, weight changes, heat/cold intolerance, bowel/skin changes or CVS symptoms She believes her dose should be not significantly changed   Past Medical History:  Diagnosis Date   Allergy    Anxiety    Arthritis    Cardiac arrhythmia    Chronic headaches    Depression    Diabetes mellitus without complication (HCC)    GERD (gastroesophageal reflux disease)    Hypothyroidism 01/17/2016   Runs in family   Left ovarian cyst 03/16/2014   Overview:  02/2014 2.3 x 2.1 03/15/14 4 x 4.1 septated labs pending started on OCP's   Migraine without aura and without status migrainosus, not intractable    Obstructive sleep apnea syndrome 11/27/2016   wears mouth guard   Pernicious anemia    Sleep apnea    Vasodepressor syncope    Vocal fold paresis, 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     Related testing: Retinal exam: Done Pneumovax: done  Objective:  BP 118/62 (BP Location: Right Arm, Patient Position: Sitting, Cuff Size: Normal)   Pulse 69   Temp 98 F (36.7 C) (Oral)   Ht 5\' 7"  (1.702 m)   Wt 196 lb 8 oz (89.1 kg)   SpO2 99%   BMI 30.78 kg/m  General:  Well developed, well nourished, in no apparent distress Skin:  Warm, no pallor or diaphoresis Head:  Normocephalic, atraumatic Eyes:  Pupils equal and round, sclera anicteric without injection  Lungs:  CTAB, no access msc use Cardio:  RRR, no bruits, no LE edema Musculoskeletal:  Symmetrical muscle groups noted  without atrophy or deformity Neuro:  Sensation intact to pinprick on feet Psych: Age appropriate judgment and insight  Assessment:   Type 2 diabetes mellitus with hyperglycemia, without long-term current use of insulin (HCC) - Plan: Comprehensive metabolic panel, Lipid panel, Hemoglobin A1c, Microalbumin / creatinine urine ratio  Hypothyroidism, unspecified type - Plan: TSH, T4, free   Plan:   Chronic, stable. Cont Mounjaro 7.5 mg/week, may drop the metformin pending results. Counseled on diet and exercise. Chronic, stable. Cont levothyroxine 50 mcg/d.  F/u in 6 mo. The patient voiced understanding and agreement to the plan.  Jilda Roche [place], DO 09/12/22 7:21 AM

## 2022-10-20 ENCOUNTER — Encounter: Payer: Self-pay | Admitting: Family Medicine

## 2022-10-20 ENCOUNTER — Ambulatory Visit: Payer: 59 | Admitting: Family Medicine

## 2022-10-20 VITALS — BP 120/85 | HR 103 | Temp 98.8°F | Ht 67.0 in | Wt 197.1 lb

## 2022-10-20 DIAGNOSIS — R42 Dizziness and giddiness: Secondary | ICD-10-CM

## 2022-10-20 DIAGNOSIS — E1165 Type 2 diabetes mellitus with hyperglycemia: Secondary | ICD-10-CM

## 2022-10-20 DIAGNOSIS — Z7985 Long-term (current) use of injectable non-insulin antidiabetic drugs: Secondary | ICD-10-CM | POA: Diagnosis not present

## 2022-10-20 DIAGNOSIS — Z7984 Long term (current) use of oral hypoglycemic drugs: Secondary | ICD-10-CM | POA: Diagnosis not present

## 2022-10-20 LAB — GLUCOSE, POCT (MANUAL RESULT ENTRY): POC Glucose: 92 mg/dl (ref 70–99)

## 2022-10-20 NOTE — Progress Notes (Signed)
Chief Complaint  Patient presents with   Hypoglycemia    Hands shaking and heart racing Not sleeping or eating well in a week    Subjective: Patient is a 47 y.o. female here for low sugar.  Patient is on Mounjaro 7.5 mg weekly and metformin XR 500 mg daily.  She has lost a significant mount of weight on Mounjaro and we are in the process of weaning her off of the metformin.  Over the last week, she has felt shaky, rapid heartbeat, mentally foggy, dizzy, and weak.  When she eats, she feels better.  Her sugars have been as low as 62.  The week prior to this, she had more diarrhea than usual which she attributes to the [facility].  No bleeding, nausea, or vomiting.  She has not been sick otherwise.  Past Medical History:  Diagnosis Date   Allergy    Anxiety    Arthritis    Cardiac arrhythmia    Chronic headaches    Depression    Diabetes mellitus without complication (HCC)    GERD (gastroesophageal reflux disease)    Hypothyroidism 01/17/2016   Runs in family   Left ovarian cyst 03/16/2014   Overview:  02/2014 2.3 x 2.1 03/15/14 4 x 4.1 septated labs pending started on OCP's   Migraine without aura and without status migrainosus, not intractable    Obstructive sleep apnea syndrome 11/27/2016   wears mouth guard   Pernicious anemia    Sleep apnea    Vasodepressor syncope    Vocal fold paresis, 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 No accessory muscle use Psych: Age appropriate judgment and insight, normal affect and mood  Assessment and Plan: Type 2 diabetes mellitus with  hyperglycemia, without long-term current use of insulin (HCC) - Plan: POCT Glucose (CBG)  Lightheaded - Plan: CBC, Comprehensive metabolic panel, TSH, Urine Culture, Urine Microscopic Only  Potential adverse effect of chronic medication.  Stop metformin, for now she will continue Mounjaro 7.5 mg weekly.  She will keep an eye on her sugars.  We will check the above labs.  We may need to reduce the [facility] dosage to 5 mg weekly.  Instructed to stay hydrated. The patient voiced understanding and agreement to the plan.  Jilda Roche [place], DO 10/20/22  4:51 PM

## 2022-10-20 NOTE — Patient Instructions (Addendum)
Try to drink 55-60 oz of water daily outside of exercise.  Give [place] 2-3 business days to get the results of your labs back.   Keep the diet clean and stay active.  We are done with the metformin for now. Mind your sugars in the next few days.   Let us know if you need anything.

## 2022-10-21 LAB — COMPREHENSIVE METABOLIC PANEL
ALT: 13 U/L (ref 0–35)
AST: 16 U/L (ref 0–37)
Albumin: 4.3 g/dL (ref 3.5–5.2)
Alkaline Phosphatase: 37 U/L — ABNORMAL LOW (ref 39–117)
BUN: 11 mg/dL (ref 6–23)
CO2: 26 mEq/L (ref 19–32)
Calcium: 9.9 mg/dL (ref 8.4–10.5)
Chloride: 100 mEq/L (ref 96–112)
Creatinine, Ser: 0.61 mg/dL (ref 0.40–1.20)
GFR: 105.92 mL/min (ref >60.00)
Glucose, Bld: 91 mg/dL (ref 70–99)
Potassium: 4.1 mEq/L (ref 3.5–5.1)
Sodium: 135 mEq/L (ref 135–145)
Total Bilirubin: 0.4 mg/dL (ref 0.2–1.2)
Total Protein: 7.2 g/dL (ref 6.0–8.3)

## 2022-10-21 LAB — URINE CULTURE
MICRO NUMBER:: 15200302
Result:: NO GROWTH
SPECIMEN QUALITY:: ADEQUATE

## 2022-10-21 LAB — CBC
HCT: 39.5 % (ref 36.0–46.0)
Hemoglobin: 12.7 g/dL (ref 12.0–15.0)
MCHC: 32.2 g/dL (ref 30.0–36.0)
MCV: 86.1 fl (ref 78.0–100.0)
Platelets: 413 10*3/uL — ABNORMAL HIGH (ref 150.0–400.0)
RBC: 4.59 Mil/uL (ref 3.87–5.11)
RDW: 14.2 % (ref 11.5–15.5)
WBC: 12.4 10*3/uL — ABNORMAL HIGH (ref 4.0–10.5)

## 2022-10-21 LAB — URINALYSIS, MICROSCOPIC ONLY: RBC / HPF: NONE SEEN (ref 0–?)

## 2022-10-21 LAB — TSH: TSH: 3.4 u[IU]/mL (ref 0.35–5.50)

## 2022-10-22 ENCOUNTER — Ambulatory Visit: Payer: 59 | Admitting: Family Medicine

## 2022-12-01 ENCOUNTER — Encounter: Payer: Self-pay | Admitting: Family Medicine

## 2022-12-04 ENCOUNTER — Telehealth: Payer: Self-pay

## 2022-12-04 ENCOUNTER — Other Ambulatory Visit (HOSPITAL_COMMUNITY): Payer: Self-pay

## 2022-12-04 NOTE — Telephone Encounter (Signed)
PA request has been Submitted. New Encounter created for follow up. For additional info see Pharmacy Prior Auth telephone encounter from 12/04/2022.

## 2022-12-04 NOTE — Telephone Encounter (Signed)
Pharmacy Patient Advocate Encounter   Received notification from Patient Advice Request messages that prior authorization for Mounjaro 7.5MG /0.5ML pen-injectors is required/requested.   Insurance verification completed.   The patient is insured through [employer] .   Per test claim: PA required; PA submitted to [employer] via CoverMyMeds Key/confirmation #/EOC [id number] Status is pending

## 2022-12-04 NOTE — Telephone Encounter (Signed)
Pharmacy Patient Advocate Encounter  Received notification from [employer] that Prior Authorization for [facility] 7.5MG /0.5ML pen-injectors has been APPROVED from 11/04/2022 to 12/04/2023. Ran test claim, Copay is $35.00. This test claim was processed through [facility]- copay amounts may vary at other pharmacies due to pharmacy/plan contracts, or as the patient moves through the different stages of their insurance plan.   PA #/Case ID/Reference #: [id number]

## 2022-12-05 NOTE — Telephone Encounter (Signed)
Patient informed. 

## 2022-12-15 ENCOUNTER — Encounter: Payer: Self-pay | Admitting: Family Medicine

## 2022-12-15 ENCOUNTER — Other Ambulatory Visit (INDEPENDENT_AMBULATORY_CARE_PROVIDER_SITE_OTHER): Payer: 59

## 2022-12-15 ENCOUNTER — Other Ambulatory Visit: Payer: Self-pay | Admitting: Family Medicine

## 2022-12-15 DIAGNOSIS — E1165 Type 2 diabetes mellitus with hyperglycemia: Secondary | ICD-10-CM

## 2022-12-15 DIAGNOSIS — N83202 Unspecified ovarian cyst, left side: Secondary | ICD-10-CM

## 2022-12-15 LAB — HEMOGLOBIN A1C: Hgb A1c MFr Bld: 6.4 % (ref 4.6–6.5)

## 2023-01-03 ENCOUNTER — Other Ambulatory Visit: Payer: Self-pay | Admitting: Family Medicine

## 2023-02-09 ENCOUNTER — Telehealth: Payer: 59 | Admitting: Physician Assistant

## 2023-02-09 DIAGNOSIS — J40 Bronchitis, not specified as acute or chronic: Secondary | ICD-10-CM

## 2023-02-09 DIAGNOSIS — B9689 Other specified bacterial agents as the cause of diseases classified elsewhere: Secondary | ICD-10-CM

## 2023-02-09 DIAGNOSIS — U071 COVID-19: Secondary | ICD-10-CM

## 2023-02-09 DIAGNOSIS — H66001 Acute suppurative otitis media without spontaneous rupture of ear drum, right ear: Secondary | ICD-10-CM | POA: Diagnosis not present

## 2023-02-09 DIAGNOSIS — J019 Acute sinusitis, unspecified: Secondary | ICD-10-CM | POA: Diagnosis not present

## 2023-02-09 DIAGNOSIS — B999 Unspecified infectious disease: Secondary | ICD-10-CM

## 2023-02-09 MED ORDER — ALBUTEROL SULFATE HFA 108 (90 BASE) MCG/ACT IN AERS: 1.0000 | INHALATION_SPRAY | Freq: Four times a day (QID) | RESPIRATORY_TRACT | 0 refills | Status: DC | PRN

## 2023-02-09 MED ORDER — AMOXICILLIN-POT CLAVULANATE 875-125 MG PO TABS: 1.0000 | ORAL_TABLET | Freq: Two times a day (BID) | ORAL | 0 refills | Status: DC

## 2023-02-09 MED ORDER — PSEUDOEPH-BROMPHEN-DM 30-2-10 MG/5ML PO SYRP
5.0000 mL | ORAL_SOLUTION | Freq: Four times a day (QID) | ORAL | 0 refills | Status: DC | PRN
Start: 2023-02-09 — End: 2023-02-19

## 2023-02-09 NOTE — Patient Instructions (Signed)
Roda Shutters, thank you for joining [name], PA-C for today's virtual visit.  While this provider is not your primary care provider (PCP), if your PCP is located in our provider database this encounter information will be shared with them immediately following your visit.   A [facility] MyChart account gives you access to today's visit and all your visits, tests, and labs performed at [facility] " click here if you don't have a [facility] MyChart account or go to [email or web address]  Consent: (Patient) Debra West provided verbal consent for this virtual visit at the beginning of the encounter.  Current Medications:  Current Outpatient Medications:    albuterol (VENTOLIN HFA) 108 (90 Base) MCG/ACT inhaler, Inhale 1-2 puffs into the lungs every 6 (six) hours as needed., Disp: 8 g, Rfl: 0   amoxicillin-clavulanate (AUGMENTIN) 875-125 MG tablet, Take 1 tablet by mouth 2 (two) times daily., Disp: 20 tablet, Rfl: 0   brompheniramine-pseudoephedrine-DM 30-2-10 MG/5ML syrup, Take 5 mLs by mouth 4 (four) times daily as needed., Disp: 120 mL, Rfl: 0   acetaminophen (TYLENOL) 500 MG tablet, Take by mouth., Disp: , Rfl:    Calcium Carb-Cholecalciferol (OYSTER SHELL CALCIUM W/D) 500-5 MG-MCG TABS, Take 1 tablet by mouth daily., Disp: , Rfl:    Cholecalciferol (VITAMIN D-3) 125 MCG (5000 UT) TABS, Take by mouth., Disp: , Rfl:    cyclobenzaprine (FLEXERIL) 5 MG tablet, Take 1 tablet by mouth as needed., Disp: , Rfl:    FLUoxetine (PROZAC) 20 MG capsule, TAKE 1 CAPSULE DAILY, Disp: 90 capsule, Rfl: 3   ibuprofen (ADVIL) 200 MG tablet, 1 tablet with food or milk as needed Orally Three times a day, Disp: , Rfl:    levothyroxine (SYNTHROID) 50 MCG tablet, TAKE 1 TABLET DAILY, Disp: 90 tablet, Rfl: 3   Norethindrone Acetate-Ethinyl Estradiol (LOESTRIN) 1.5-30 MG-MCG tablet, TAKE 1 TABLET DAILY, Disp: 63 tablet, Rfl: 3   rosuvastatin (CRESTOR) 20 MG tablet, Take 1 tablet (20  mg total) by mouth daily., Disp: 90 tablet, Rfl: 3   tirzepatide (MOUNJARO) 7.5 MG/0.5ML Pen, INJECT 7.5 MG SUBCUTANEOUSLY WEEKLY, Disp: 2 mL, Rfl: 2   vitamin B-12 (CYANOCOBALAMIN) 1000 MCG tablet, Take 1,000 mcg by mouth daily., Disp: , Rfl:   Current Facility-Administered Medications:    0.9 %  sodium chloride infusion, 500 mL, Intravenous, Once, Mansouraty, Netty Starring., MD   Medications ordered in this encounter:  Meds ordered this encounter  Medications   amoxicillin-clavulanate (AUGMENTIN) 875-125 MG tablet    Sig: Take 1 tablet by mouth 2 (two) times daily.    Dispense:  20 tablet    Refill:  0    Order Specific Question:   Supervising Provider    Answer:   Loreli Dollar   albuterol (VENTOLIN HFA) 108 (90 Base) MCG/ACT inhaler    Sig: Inhale 1-2 puffs into the lungs every 6 (six) hours as needed.    Dispense:  8 g    Refill:  0    Order Specific Question:   Supervising Provider    Answer:   Merrilee Jansky [id number]   brompheniramine-pseudoephedrine-DM 30-2-10 MG/5ML syrup    Sig: Take 5 mLs by mouth 4 (four) times daily as needed.    Dispense:  120 mL    Refill:  0    Order Specific Question:   Supervising Provider    Answer:   Merrilee Jansky [id number]     *If you need refills on other medications prior to your next appointment,  please contact your pharmacy*  Follow-Up: Call back or seek an in-person evaluation if the symptoms worsen or if the condition fails to improve as anticipated.  [facility] [phone]  Care Instructions: Can take to lessen severity: Vit C 500mg  twice daily Quercertin 250-500mg  twice daily Zinc 75-100mg  daily Melatonin 3-6 mg at bedtime Vit D3 1000-2000 IU daily Aspirin 81 mg daily with food Optional: Famotidine 20mg  daily Also can add tylenol/ibuprofen as needed for fevers and body aches May add Mucinex or Mucinex DM as needed for cough/congestion    Isolation Instructions: You are to isolate at  home until you have been fever free for at least 24 hours without a fever-reducing medication, and symptoms have been steadily improving for 24 hours. At that time,  you can end isolation but need to mask for an additional 5 days.   If you must be around other household members who do not have symptoms, you need to make sure that both you and the family members are masking consistently with a high-quality mask.  If you note any worsening of symptoms despite treatment, please seek an in-person evaluation ASAP. If you note any significant shortness of breath or any chest pain, please seek ER evaluation. Please do not delay care!   COVID-19: What to Do if You Are Sick If you test positive and are an older adult or someone who is at high risk of getting very sick from COVID-19, treatment may be available. Contact a healthcare provider right away after a positive test to determine if you are eligible, even if your symptoms are mild right now. You can also visit a Test to Treat location and, if eligible, receive a prescription from a provider. Don't delay: Treatment must be started within the first few days to be effective. If you have a fever, cough, or other symptoms, you might have COVID-19. Most people have mild illness and are able to recover at home. If you are sick: Keep track of your symptoms. If you have an emergency warning sign (including trouble breathing), call 911. Steps to help prevent the spread of COVID-19 if you are sick If you are sick with COVID-19 or think you might have COVID-19, follow the steps below to care for yourself and to help protect other people in your home and community. Stay home except to get medical care Stay home. Most people with COVID-19 have mild illness and can recover at home without medical care. Do not leave your home, except to get medical care. Do not visit public areas and do not go to places where you are unable to wear a mask. Take care of yourself. Get  rest and stay hydrated. Take over-the-counter medicines, such as acetaminophen, to help you feel better. Stay in touch with your doctor. Call before you get medical care. Be sure to get care if you have trouble breathing, or have any other emergency warning signs, or if you think it is an emergency. Avoid public transportation, ride-sharing, or taxis if possible. Get tested If you have symptoms of COVID-19, get tested. While waiting for test results, stay away from others, including staying apart from those living in your household. Get tested as soon as possible after your symptoms start. Treatments may be available for people with COVID-19 who are at risk for becoming very sick. Don't delay: Treatment must be started early to be effective--some treatments must begin within 5 days of your first symptoms. Contact your healthcare provider right away if your  test result is positive to determine if you are eligible. Self-tests are one of several options for testing for the virus that causes COVID-19 and may be more convenient than laboratory-based tests and point-of-care tests. Ask your healthcare provider or your local health department if you need help interpreting your test results. You can visit your state, tribal, local, and territorial health department's website to look for the latest local information on testing sites. Separate yourself from other people As much as possible, stay in a specific room and away from other people and pets in your home. If possible, you should use a separate bathroom. If you need to be around other people or animals in or outside of the home, wear a well-fitting mask. Tell your close contacts that they may have been exposed to COVID-19. An infected person can spread COVID-19 starting 48 hours (or 2 days) before the person has any symptoms or tests positive. By letting your close contacts know they may have been exposed to COVID-19, you are helping to protect everyone. See  COVID-19 and Animals if you have questions about pets. If you are diagnosed with COVID-19, someone from the health department may call you. Answer the call to slow the spread. Monitor your symptoms Symptoms of COVID-19 include fever, cough, or other symptoms. Follow care instructions from your healthcare provider and local health department. Your local health authorities may give instructions on checking your symptoms and reporting information. When to seek emergency medical attention Look for emergency warning signs* for COVID-19. If someone is showing any of these signs, seek emergency medical care immediately: Trouble breathing Persistent pain or pressure in the chest New confusion Inability to wake or stay awake Pale, gray, or blue-colored skin, lips, or nail beds, depending on skin tone *This list is not all possible symptoms. Please call your medical provider for any other symptoms that are severe or concerning to you. Call 911 or call ahead to your local emergency facility: Notify the operator that you are seeking care for someone who has or may have COVID-19. Call ahead before visiting your doctor Call ahead. Many medical visits for routine care are being postponed or done by phone or telemedicine. If you have a medical appointment that cannot be postponed, call your doctor's office, and tell them you have or may have COVID-19. This will help the office protect themselves and other patients. If you are sick, wear a well-fitting mask You should wear a mask if you must be around other people or animals, including pets (even at home). Wear a mask with the best fit, protection, and comfort for you. You don't need to wear the mask if you are alone. If you can't put on a mask (because of trouble breathing, for example), cover your coughs and sneezes in some other way. Try to stay at least 6 feet away from other people. This will help protect the people around you. Masks should not be placed on  young children under age 36 years, anyone who has trouble breathing, or anyone who is not able to remove the mask without help. Cover your coughs and sneezes Cover your mouth and nose with a tissue when you cough or sneeze. Throw away used tissues in a lined trash can. Immediately wash your hands with soap and water for at least 20 seconds. If soap and water are not available, clean your hands with an alcohol-based hand sanitizer that contains at least 60% alcohol. Clean your hands often Wash your hands often with soap  and water for at least 20 seconds. This is especially important after blowing your nose, coughing, or sneezing; going to the bathroom; and before eating or preparing food. Use hand sanitizer if soap and water are not available. Use an alcohol-based hand sanitizer with at least 60% alcohol, covering all surfaces of your hands and rubbing them together until they feel dry. Soap and water are the best option, especially if hands are visibly dirty. Avoid touching your eyes, nose, and mouth with unwashed hands. Handwashing Tips Avoid sharing personal household items Do not share dishes, drinking glasses, cups, eating utensils, towels, or bedding with other people in your home. Wash these items thoroughly after using them with soap and water or put in the dishwasher. Clean surfaces in your home regularly Clean and disinfect high-touch surfaces (for example, doorknobs, tables, handles, light switches, and countertops) in your "sick room" and bathroom. In shared spaces, you should clean and disinfect surfaces and items after each use by the person who is ill. If you are sick and cannot clean, a caregiver or other person should only clean and disinfect the area around you (such as your bedroom and bathroom) on an as needed basis. Your caregiver/other person should wait as long as possible (at least several hours) and wear a mask before entering, cleaning, and disinfecting shared spaces that you  use. Clean and disinfect areas that may have blood, stool, or body fluids on them. Use household cleaners and disinfectants. Clean visible dirty surfaces with household cleaners containing soap or detergent. Then, use a household disinfectant. Use a product from [employer] List N: Disinfectants for Coronavirus (COVID-19). Be sure to follow the instructions on the label to ensure safe and effective use of the product. Many products recommend keeping the surface wet with a disinfectant for a certain period of time (look at "contact time" on the product label). You may also need to wear personal protective equipment, such as gloves, depending on the directions on the product label. Immediately after disinfecting, wash your hands with soap and water for 20 seconds. For completed guidance on cleaning and disinfecting your home, visit Complete Disinfection Guidance. Take steps to improve ventilation at home Improve ventilation (air flow) at home to help prevent from spreading COVID-19 to other people in your household. Clear out COVID-19 virus particles in the air by opening windows, using air filters, and turning on fans in your home. Use this interactive tool to learn how to improve air flow in your home. When you can be around others after being sick with COVID-19 Deciding when you can be around others is different for different situations. Find out when you can safely end home isolation. For any additional questions about your care, contact your healthcare provider or state or local health department. 06/26/2020 Content source: [facility] ([facility]), Division of Viral Diseases This information is not intended to replace advice given to you by your health care provider. Make sure you discuss any questions you have with your health care provider. Document Revised: 08/09/2020 Document Reviewed: 08/09/2020 Elsevier Patient Education  2022 [employer].     If  you have been instructed to have an in-person evaluation today at a local Urgent Care facility, please use the link below. It will take you to a list of all of our available [facility], including address, phone number and hours of operation. Please do not delay care.  [facility]  If you or a family member  do not have a primary care provider, use the link below to schedule a visit and establish care. When you choose a [facility] primary care physician or advanced practice provider, you gain a long-term partner in health. Find a Primary Care Provider  Learn more about [facility]'s in-office and virtual care options: [facility] - Get Care Now'

## 2023-02-09 NOTE — Progress Notes (Signed)
Virtual Visit Consent   Debra West, you are scheduled for a virtual visit with a [facility] provider today. Just as with appointments in the office, your consent must be obtained to participate. Your consent will be active for this visit and any virtual visit you may have with one of our providers in the next 365 days. If you have a MyChart account, a copy of this consent can be sent to you electronically.  As this is a virtual visit, video technology does not allow for your provider to perform a traditional examination. This may limit your provider's ability to fully assess your condition. If your provider identifies any concerns that need to be evaluated in person or the need to arrange testing (such as labs, EKG, etc.), we will make arrangements to do so. Although advances in technology are sophisticated, we cannot ensure that it will always work on either your end or our end. If the connection with a video visit is poor, the visit may have to be switched to a telephone visit. With either a video or telephone visit, we are not always able to ensure that we have a secure connection.  By engaging in this virtual visit, you consent to the provision of healthcare and authorize for your insurance to be billed (if applicable) for the services provided during this visit. Depending on your insurance coverage, you may receive a charge related to this service.  I need to obtain your verbal consent now. Are you willing to proceed with your visit today? Debra West has provided verbal consent on 02/09/2023 for a virtual visit (video or telephone). Debra Loveless, PA-C  Date: 02/09/2023 11:22 AM  Virtual Visit via Video Note   I, Debra West, connected with  Debra West  ([id number], 1976/02/16) on 02/09/23 at 11:15 AM EST by a video-enabled telemedicine application and verified that I am speaking with the correct person using two identifiers.  Location: Patient: Virtual Visit Location  Patient: Home Provider: Virtual Visit Location Provider: Home Office   I discussed the limitations of evaluation and management by telemedicine and the availability of in person appointments. The patient expressed understanding and agreed to proceed.    History of Present Illness: Debra West is a 47 y.o. who identifies as a female who was assigned female at birth, and is being seen today for Covid 65.  HPI: URI  This is a new problem. The current episode started in the past 7 days (Tested positive for Covid 19; Symptoms started 02/04/23; She was started on Paxlovid on 02/04/23). The problem has been gradually worsening. Maximum temperature: 99-100. Fever duration: restarted over last couple of days. Associated symptoms include chest pain (tightness), congestion, coughing, ear pain (right), headaches, a plugged ear sensation (right), rhinorrhea (post nasal drainage), sinus pain and wheezing (improves with coughing). Pertinent negatives include no diarrhea, nausea, sore throat or vomiting. Associated symptoms comments: Mucus has changed from clear to brown. Treatments tried: Paxlovid, nyquil, dayquil. The treatment provided no relief.     Problems:  Patient Active Problem List   Diagnosis Date Noted   Cervical radiculopathy 06/11/2022   Brachial plexopathy 05/05/2022   Sedimentation rate elevation 02/06/2022   Myofascial pain 02/06/2022   Lateral epicondylitis of left elbow 09/10/2021   Lateral epicondylitis of right elbow 07/10/2021   Polyneuropathy 07/10/2021   Cervical strain 11/27/2020   Type 2 diabetes mellitus with hyperglycemia, without long-term current use of insulin (HCC) 08/27/2020   GAD (generalized anxiety disorder) 08/15/2020   Vitamin  D deficiency 08/04/2018   Psychophysiological insomnia 08/04/2018   Dyspepsia 08/04/2018   Atopic dermatitis 08/04/2018   Intermittent low back pain 08/04/2018   Ganglion cyst of volar aspect of left wrist 04/16/2018   Left wrist pain  04/16/2018   Obstructive sleep apnea syndrome 11/27/2016   Vocal fold paresis, 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   Pernicious anemia 05/16/2016   Severe obesity (BMI 35.0-35.9 with comorbidity) (HCC) 05/16/2016   Migraine without aura and without status migrainosus, not intractable    Gastroesophageal reflux disease    Leucocytosis 05/13/2016   Hypothyroidism 01/17/2016   Left ovarian cyst 03/16/2014   Encounter for general adult medical examination without abnormal findings 09/23/2010   Diaphragmatic hernia 06/14/2010   Adjustment disorder with depressed mood 07/12/2008   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    Allergies:  Allergies  Allergen Reactions   Depo-Medrol [Methylprednisolone Sodium Succ] Shortness Of Breath   Raspberry Hives    AROUND NECK   Aspirin Nausea And Vomiting   Medications:  Current Outpatient Medications:    albuterol (VENTOLIN HFA) 108 (90 Base) MCG/ACT inhaler, Inhale 1-2 puffs into the lungs every 6 (six) hours as needed., Disp: 8 g, Rfl: 0   amoxicillin-clavulanate (AUGMENTIN) 875-125 MG tablet, Take 1 tablet by mouth 2 (two) times daily., Disp: 20 tablet, Rfl: 0   brompheniramine-pseudoephedrine-DM 30-2-10 MG/5ML syrup, Take 5 mLs by mouth 4 (four) times daily as needed., Disp: 120 mL, Rfl: 0   acetaminophen (TYLENOL) 500 MG tablet, Take by mouth., Disp: , Rfl:    Calcium Carb-Cholecalciferol (OYSTER SHELL CALCIUM W/D) 500-5 MG-MCG TABS, Take 1 tablet by mouth daily., Disp: , Rfl:    Cholecalciferol (VITAMIN D-3) 125 MCG (5000 UT) TABS, Take by mouth., Disp: , Rfl:    cyclobenzaprine (FLEXERIL) 5 MG tablet, Take 1 tablet by mouth as needed., Disp: , Rfl:    FLUoxetine (PROZAC) 20 MG capsule, TAKE 1 CAPSULE DAILY, Disp: 90 capsule, Rfl: 3   ibuprofen (ADVIL) 200 MG tablet, 1 tablet with food or milk as needed Orally Three times a day, Disp: , Rfl:    levothyroxine (SYNTHROID) 50 MCG tablet, TAKE 1 TABLET DAILY, Disp: 90 tablet, Rfl: 3   Norethindrone Acetate-Ethinyl Estradiol  (LOESTRIN) 1.5-30 MG-MCG tablet, TAKE 1 TABLET DAILY, Disp: 63 tablet, Rfl: 3   rosuvastatin (CRESTOR) 20 MG tablet, Take 1 tablet (20 mg total) by mouth daily., Disp: 90 tablet, Rfl: 3   tirzepatide (MOUNJARO) 7.5 MG/0.5ML Pen, INJECT 7.5 MG SUBCUTANEOUSLY WEEKLY, Disp: 2 mL, Rfl: 2   vitamin B-12 (CYANOCOBALAMIN) 1000 MCG tablet, Take 1,000 mcg by mouth daily., Disp: , Rfl:   Current Facility-Administered Medications:    0.9 %  sodium chloride infusion, 500 mL, Intravenous, Once, Mansouraty, Netty Starring., MD  Observations/Objective: Patient is well-developed, well-nourished in no acute distress.  Resting comfortably at home.  Head is normocephalic, atraumatic.  No labored breathing.  Speech is clear and coherent with logical content.  Patient is alert and oriented at baseline.    Assessment and Plan: 1. Acute bacterial sinusitis - amoxicillin-clavulanate (AUGMENTIN) 875-125 MG tablet; Take 1 tablet by mouth 2 (two) times daily.  Dispense: 20 tablet; Refill: 0  2. Non-recurrent acute suppurative otitis media of right ear without spontaneous rupture of tympanic membrane - amoxicillin-clavulanate (AUGMENTIN) 875-125 MG tablet; Take 1 tablet by mouth 2 (two) times daily.  Dispense: 20 tablet; Refill: 0  3. Bronchitis - albuterol (VENTOLIN HFA) 108 (90 Base) MCG/ACT inhaler; Inhale 1-2 puffs into the lungs every 6 (six) hours as needed.  Dispense: 8 g; Refill: 0 - brompheniramine-pseudoephedrine-DM 30-2-10 MG/5ML syrup; Take 5 mLs by mouth 4 (four) times daily as needed.  Dispense: 120 mL; Refill: 0  4. COVID-19  5. Superimposed infection  - Worsening symptoms with Covid 19 despite antiviral treatment with Paxlovid suggesting superimposed infections - Will give Augmentin - Bromfed DM for cough - Albuterol for bronchitis symptoms - Continue allergy medications.  - Steam and humidifier can help - Stay well hydrated and get plenty of rest.  - Seek in person evaluation if no symptom  improvement or if symptoms worsen   Follow Up Instructions: I discussed the assessment and treatment plan with the patient. The patient was provided an opportunity to ask questions and all were answered. The patient agreed with the plan and demonstrated an understanding of the instructions.  A copy of instructions were sent to the patient via MyChart unless otherwise noted below.    The patient was advised to call back or seek an in-person evaluation if the symptoms worsen or if the condition fails to improve as anticipated.    Debra Loveless, PA-C

## 2023-02-09 NOTE — Progress Notes (Signed)
   Thank you for the details you included in the comment boxes. Those details are very helpful in determining the best course of treatment for you and help [place] to provide the best care. Because you have tested positive for Covid 19, we recommend that you schedule a Virtual Urgent Care video visit in order for the provider to better assess what is going on.  The provider will be able to give you a more accurate diagnosis and treatment plan if we can more freely discuss your symptoms and with the addition of a virtual examination.   If you change your visit to a video visit, we will [employer] your insurance (similar to an office visit) and you will not be charged for this e-Visit. You will be able to stay at home and speak with the first available [facility] advanced practice provider. The link to do a video visit is in the drop down Menu tab of your Welcome screen in MyChart.    I have spent 5 minutes in review of e-visit questionnaire, review and updating patient chart, medical decision making and response to patient.   [name], PA-C

## 2023-02-19 ENCOUNTER — Encounter: Payer: Self-pay | Admitting: Physician Assistant

## 2023-02-19 ENCOUNTER — Ambulatory Visit: Payer: 59 | Admitting: Physician Assistant

## 2023-02-19 VITALS — BP 114/79 | HR 82 | Temp 98.5°F | Ht 67.0 in | Wt 192.1 lb

## 2023-02-19 DIAGNOSIS — J069 Acute upper respiratory infection, unspecified: Secondary | ICD-10-CM | POA: Diagnosis not present

## 2023-02-19 MED ORDER — LORATADINE 10 MG PO TABS: 10.0000 mg | ORAL_TABLET | Freq: Every day | ORAL | 0 refills | Status: DC

## 2023-02-19 MED ORDER — AZELASTINE HCL 0.1 % NA SOLN
1.0000 | Freq: Two times a day (BID) | NASAL | 0 refills | Status: DC
Start: 2023-02-19 — End: 2023-03-13

## 2023-02-19 NOTE — Progress Notes (Signed)
Established patient visit   Patient: Debra West   DOB: 1976-01-06   47 y.o. Female  MRN: [id number] Visit Date: 02/19/2023  Today's healthcare provider: Alfredia Ferguson, PA-C   Cc. Continued uri symptoms  Subjective     Pt was diagnosed with covid 10/30, developed into a sinusitis, she was treated with augmentin. Today, she reports continued nasal congestion, right ear pressure, and sweating at night. Denies fevers.  Medications: Outpatient Medications Prior to Visit  Medication Sig   acetaminophen (TYLENOL) 500 MG tablet Take by mouth.   albuterol (VENTOLIN HFA) 108 (90 Base) MCG/ACT inhaler Inhale 1-2 puffs into the lungs every 6 (six) hours as needed.   Calcium Carb-Cholecalciferol (OYSTER SHELL CALCIUM W/D) 500-5 MG-MCG TABS Take 1 tablet by mouth daily.   Cholecalciferol (VITAMIN D-3) 125 MCG (5000 UT) TABS Take by mouth.   cyclobenzaprine (FLEXERIL) 5 MG tablet Take 1 tablet by mouth as needed.   FLUoxetine (PROZAC) 20 MG capsule TAKE 1 CAPSULE DAILY   ibuprofen (ADVIL) 200 MG tablet 1 tablet with food or milk as needed Orally Three times a day   levothyroxine (SYNTHROID) 50 MCG tablet TAKE 1 TABLET DAILY   Norethindrone Acetate-Ethinyl Estradiol (LOESTRIN) 1.5-30 MG-MCG tablet TAKE 1 TABLET DAILY   rosuvastatin (CRESTOR) 20 MG tablet Take 1 tablet (20 mg total) by mouth daily.   tirzepatide (MOUNJARO) 7.5 MG/0.5ML Pen INJECT 7.5 MG SUBCUTANEOUSLY WEEKLY   vitamin B-12 (CYANOCOBALAMIN) 1000 MCG tablet Take 1,000 mcg by mouth daily.   [DISCONTINUED] amoxicillin-clavulanate (AUGMENTIN) 875-125 MG tablet Take 1 tablet by mouth 2 (two) times daily.   [DISCONTINUED] brompheniramine-pseudoephedrine-DM 30-2-10 MG/5ML syrup Take 5 mLs by mouth 4 (four) times daily as needed.   Facility-Administered Medications Prior to Visit  Medication Dose Route Frequency Provider   0.9 %  sodium chloride infusion  500 mL Intravenous Once Mansouraty, Netty Starring., MD    Review of  Systems  Constitutional:  Negative for fatigue and fever.  HENT:  Positive for congestion, ear pain and rhinorrhea.   Respiratory:  Positive for cough. Negative for shortness of breath.   Cardiovascular:  Negative for chest pain and leg swelling.  Gastrointestinal:  Negative for abdominal pain.  Neurological:  Negative for dizziness and headaches.       Objective    BP 114/79   Pulse 82   Temp 98.5 F (36.9 C) (Oral)   Ht 5\' 7"  (1.702 m)   Wt 192 lb 2 oz (87.1 kg)   SpO2 100%   BMI 30.09 kg/m    Physical Exam Constitutional:      General: She is awake.     Appearance: She is well-developed.  HENT:     Head: Normocephalic.     Right Ear: Tympanic membrane normal.     Left Ear: Tympanic membrane normal.     Mouth/Throat:     Pharynx: No oropharyngeal exudate or posterior oropharyngeal erythema.  Eyes:     Conjunctiva/sclera: Conjunctivae normal.  Cardiovascular:     Rate and Rhythm: Normal rate and regular rhythm.     Heart sounds: Normal heart sounds.  Pulmonary:     Effort: Pulmonary effort is normal.     Breath sounds: Normal breath sounds. No wheezing or rales.  Skin:    General: Skin is warm.  Neurological:     Mental Status: She is alert and oriented to person, place, and time.  Psychiatric:        Attention and Perception: Attention normal.  Mood and Affect: Mood normal.        Speech: Speech normal.        Behavior: Behavior is cooperative.     No results found for any visits on 02/19/23.  Assessment & Plan    Upper respiratory tract infection, unspecified type -     Loratadine; Take 1 tablet (10 mg total) by mouth daily.  Dispense: 60 tablet; Refill: 0 -     Azelastine HCl; Place 1 spray into both nostrils 2 (two) times daily. Use in each nostril as directed  Dispense: 30 mL; Refill: 0   Reassured pt no sign of ear infection, lungs clear. At this time do not recommend further tx with abx Rx claritin, she can take bid for a few days, recommend  saline nasal rinses and then azelastine nasal spray.  Suggested pseudoephedrine over the counter as needed for the next 3-5 days for severe nasal congestion. Rest, hydrate, otc iburpofen/tylneol.  Return if symptoms worsen or fail to improve.       Alfredia Ferguson, PA-C  [facility] [phone] (phone) [phone] (fax)  [facility]

## 2023-03-13 ENCOUNTER — Other Ambulatory Visit: Payer: Self-pay | Admitting: Physician Assistant

## 2023-03-13 DIAGNOSIS — J069 Acute upper respiratory infection, unspecified: Secondary | ICD-10-CM

## 2023-03-17 ENCOUNTER — Encounter: Payer: Self-pay | Admitting: Family Medicine

## 2023-03-17 ENCOUNTER — Ambulatory Visit (INDEPENDENT_AMBULATORY_CARE_PROVIDER_SITE_OTHER): Payer: 59 | Admitting: Family Medicine

## 2023-03-17 VITALS — BP 120/80 | HR 76 | Temp 98.0°F | Resp 16 | Ht 67.0 in | Wt 191.8 lb

## 2023-03-17 DIAGNOSIS — E1165 Type 2 diabetes mellitus with hyperglycemia: Secondary | ICD-10-CM

## 2023-03-17 DIAGNOSIS — E039 Hypothyroidism, unspecified: Secondary | ICD-10-CM | POA: Diagnosis not present

## 2023-03-17 DIAGNOSIS — Z0000 Encounter for general adult medical examination without abnormal findings: Secondary | ICD-10-CM

## 2023-03-17 LAB — T4, FREE: Free T4: 0.85 ng/dL (ref 0.60–1.60)

## 2023-03-17 LAB — LIPID PANEL
Cholesterol: 121 mg/dL (ref 0–200)
HDL: 49.2 mg/dL (ref >39.00)
LDL Cholesterol: 50 mg/dL (ref 0–99)
NonHDL: 71.39
Total CHOL/HDL Ratio: 2
Triglycerides: 108 mg/dL (ref 0.0–149.0)
VLDL: 21.6 mg/dL (ref 0.0–40.0)

## 2023-03-17 LAB — CBC
HCT: 41.2 % (ref 36.0–46.0)
Hemoglobin: 13.6 g/dL (ref 12.0–15.0)
MCHC: 32.9 g/dL (ref 30.0–36.0)
MCV: 88.5 fL (ref 78.0–100.0)
Platelets: 312 10*3/uL (ref 150.0–400.0)
RBC: 4.66 Mil/uL (ref 3.87–5.11)
RDW: 13.6 % (ref 11.5–15.5)
WBC: 7.8 10*3/uL (ref 4.0–10.5)

## 2023-03-17 LAB — COMPREHENSIVE METABOLIC PANEL
ALT: 16 U/L (ref 0–35)
AST: 22 U/L (ref 0–37)
Albumin: 4.4 g/dL (ref 3.5–5.2)
Alkaline Phosphatase: 49 U/L (ref 39–117)
BUN: 9 mg/dL (ref 6–23)
CO2: 30 meq/L (ref 19–32)
Calcium: 9.6 mg/dL (ref 8.4–10.5)
Chloride: 99 meq/L (ref 96–112)
Creatinine, Ser: 0.65 mg/dL (ref 0.40–1.20)
GFR: 104.01 mL/min (ref >60.00)
Glucose, Bld: 91 mg/dL (ref 70–99)
Potassium: 4.2 meq/L (ref 3.5–5.1)
Sodium: 138 meq/L (ref 135–145)
Total Bilirubin: 0.6 mg/dL (ref 0.2–1.2)
Total Protein: 7.2 g/dL (ref 6.0–8.3)

## 2023-03-17 LAB — TSH: TSH: 2.33 u[IU]/mL (ref 0.35–5.50)

## 2023-03-17 LAB — HEMOGLOBIN A1C: Hgb A1c MFr Bld: 6.2 % (ref 4.6–6.5)

## 2023-03-17 MED ORDER — PREDNISONE 20 MG PO TABS
40.0000 mg | ORAL_TABLET | Freq: Every day | ORAL | 0 refills | Status: AC
Start: 2023-03-17 — End: 2023-03-22

## 2023-03-17 MED ORDER — FLUTICASONE PROPIONATE 50 MCG/ACT NA SUSP: 2.0000 | Freq: Every day | NASAL | 2 refills | Status: AC

## 2023-03-17 NOTE — Progress Notes (Signed)
Chief Complaint  Patient presents with   Annual Exam    Annual Exam     Well Woman Debra West is here for a complete physical.   Her last physical was >1 year ago.  Current diet: in general, . Current exercise: some walking. Weight is stable and she denies fatigue out of ordinary. Seatbelt? Yes Advanced directive? Yes  Health Maintenance Pap/HPV- Yes Mammogram- Yes Tetanus- Yes Hep C screening- Yes HIV screening- Yes  Past Medical History:  Diagnosis Date   Allergy    Anxiety    Arthritis    Cardiac arrhythmia    Chronic headaches    Depression    Diabetes mellitus without complication (HCC)    GERD (gastroesophageal reflux disease)    Hypothyroidism 01/17/2016   Runs in family   Left ovarian cyst 03/16/2014   Overview:  02/2014 2.3 x 2.1 03/15/14 4 x 4.1 septated labs pending started on OCP's   Migraine without aura and without status migrainosus, not intractable    Obstructive sleep apnea syndrome 11/27/2016   wears mouth guard   Pernicious anemia    Sleep apnea    Vasodepressor syncope    Vocal fold paresis, 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     albuterol (VENTOLIN HFA) 108 (90 Base) MCG/ACT inhaler Inhale 1-2 puffs into the lungs every 6 (six) hours as needed. 8 g 0   Azelastine HCl 137 MCG/SPRAY SOLN PLACE 1 SPRAY INTO BOTH NOSTRILS 2 (TWO) TIMES DAILY. USE IN EACH NOSTRIL AS DIRECTED 90 mL 1   Calcium Carb-Cholecalciferol (OYSTER SHELL CALCIUM W/D) 500-5 MG-MCG TABS Take 1 tablet by mouth daily.     Cholecalciferol (VITAMIN D-3) 125 MCG (5000 UT) TABS Take by mouth.     cyclobenzaprine (FLEXERIL) 5 MG tablet Take 1 tablet by  mouth as needed.     FLUoxetine (PROZAC) 20 MG capsule TAKE 1 CAPSULE DAILY 90 capsule 3   ibuprofen (ADVIL) 200 MG tablet 1 tablet with food or milk as needed Orally Three times a day     levothyroxine (SYNTHROID) 50 MCG tablet TAKE 1 TABLET DAILY 90 tablet 3   loratadine (CLARITIN) 10 MG tablet Take 1 tablet (10 mg total) by mouth daily. 60 tablet 0   Norethindrone Acetate-Ethinyl Estradiol (LOESTRIN) 1.5-30 MG-MCG tablet TAKE 1 TABLET DAILY 63 tablet 3   rosuvastatin (CRESTOR) 20 MG tablet Take 1 tablet (20 mg total) by mouth daily. 90 tablet 3   tirzepatide (MOUNJARO) 7.5 MG/0.5ML Pen INJECT 7.5 MG SUBCUTANEOUSLY WEEKLY 2 mL 2   vitamin B-12 (CYANOCOBALAMIN) 1000 MCG tablet Take 1,000 mcg by mouth daily.     Allergies Allergies  Allergen Reactions   Depo-Medrol [Methylprednisolone Sodium Succ] Shortness Of Breath   Raspberry Hives    AROUND NECK   Aspirin Nausea And Vomiting   Review of Systems: Constitutional:  no unexpected weight changes Eye:  no recent significant change in vision Ear/Nose/Mouth/Throat:  Ears:  no recent change in hearing Nose/Mouth/Throat:  no complaints of nasal congestion, no sore throat Cardiovascular: no chest pain Respiratory:  no shortness of breath Gastrointestinal:  no abdominal pain, no change in bowel habits GU:  Female: negative for dysuria or pelvic pain Musculoskeletal/Extremities:  no pain of the joints Integumentary (Skin/Breast):  no abnormal skin lesions reported Neurologic:  no headaches Endocrine:  denies fatigue Hematologic/Lymphatic:  No areas of easy bleeding  Exam BP 120/80 (BP Location: Left Arm, Patient Position: Sitting, Cuff Size: Normal)   Pulse 76   Temp 98 F (36.7 C) (Oral)   Resp 16   Ht 5\' 7"  (1.702 m)   Wt 191 lb 12.8 oz (87 kg)   SpO2 98%   BMI 30.04 kg/m  General:  well developed, well nourished, in no apparent distress Skin:  no significant moles, warts, or growths Head:  no masses, lesions, or  tenderness Eyes:  pupils equal and round, sclera anicteric without injection Ears:  canals without lesions, TMs shiny without retraction, no obvious effusion, no erythema Nose:  nares patent, mucosa normal, and no drainage Throat/Pharynx:  lips and gingiva without lesion; tongue and uvula midline; non-inflamed pharynx; no exudates or postnasal drainage Neck: neck supple without adenopathy, thyromegaly, or masses Lungs:  clear to auscultation, breath sounds equal bilaterally, no respiratory distress Cardio:  regular rate and rhythm, no LE edema Abdomen:  abdomen soft, nontender; bowel sounds normal; no masses or organomegaly Genital: Defer to GYN Musculoskeletal:  symmetrical muscle groups noted without atrophy or deformity Extremities:  no clubbing, cyanosis, or edema, no deformities, no skin discoloration Neuro:  gait normal; deep tendon reflexes normal and symmetric Psych: well oriented with normal range of affect and appropriate judgment/insight  Assessment and Plan  Well adult exam - Plan: CBC, Comprehensive metabolic panel, Lipid panel  Type 2 diabetes mellitus with hyperglycemia, without long-term current use of insulin (HCC) - Plan: Hemoglobin A1c  Hypothyroidism, unspecified type - Plan: TSH, T4, free   Well 47 y.o. female. Counseled on diet and exercise. Advanced directive form provided today.  Flu shot politely declined.  Other orders as above. Follow up in 6 mo. The patient voiced understanding and agreement to the plan.  Jilda Roche [place], DO 03/17/23 7:53 AM

## 2023-03-17 NOTE — Patient Instructions (Addendum)
Give [place] 2-3 business days to get the results of your labs back.   Keep the diet clean and stay active.  Please get me a copy of your advanced directive form at your convenience.   Start with the Astelin and the Flonase for the ear. If no improvement in the next 1.5 weeks or so, or worsening, take the prednisone.   Let us know if you need anything.

## 2023-03-29 ENCOUNTER — Encounter: Payer: Self-pay | Admitting: Family Medicine

## 2023-03-29 DIAGNOSIS — F411 Generalized anxiety disorder: Secondary | ICD-10-CM

## 2023-03-29 DIAGNOSIS — E039 Hypothyroidism, unspecified: Secondary | ICD-10-CM

## 2023-03-30 MED ORDER — LEVOTHYROXINE SODIUM 50 MCG PO TABS: 50.0000 ug | ORAL_TABLET | Freq: Every day | ORAL | 3 refills | Status: DC

## 2023-03-30 MED ORDER — FLUOXETINE HCL 20 MG PO CAPS: 20.0000 mg | ORAL_CAPSULE | Freq: Every day | ORAL | 3 refills | Status: DC

## 2023-04-06 ENCOUNTER — Other Ambulatory Visit: Payer: Self-pay | Admitting: Family Medicine

## 2023-04-12 ENCOUNTER — Other Ambulatory Visit: Payer: Self-pay | Admitting: Physician Assistant

## 2023-04-12 DIAGNOSIS — J069 Acute upper respiratory infection, unspecified: Secondary | ICD-10-CM

## 2023-04-21 ENCOUNTER — Encounter (HOSPITAL_BASED_OUTPATIENT_CLINIC_OR_DEPARTMENT_OTHER): Payer: Self-pay | Admitting: Emergency Medicine

## 2023-04-21 ENCOUNTER — Other Ambulatory Visit: Payer: Self-pay

## 2023-04-21 DIAGNOSIS — Z794 Long term (current) use of insulin: Secondary | ICD-10-CM | POA: Diagnosis not present

## 2023-04-21 DIAGNOSIS — E11649 Type 2 diabetes mellitus with hypoglycemia without coma: Secondary | ICD-10-CM | POA: Diagnosis not present

## 2023-04-21 DIAGNOSIS — R519 Headache, unspecified: Secondary | ICD-10-CM | POA: Diagnosis present

## 2023-04-21 LAB — CBC WITH DIFFERENTIAL/PLATELET
Abs Immature Granulocytes: 0.04 10*3/uL (ref 0.00–0.07)
Basophils Absolute: 0.1 10*3/uL (ref 0.0–0.1)
Basophils Relative: 1 %
Eosinophils Absolute: 0.3 10*3/uL (ref 0.0–0.5)
Eosinophils Relative: 2 %
HCT: 40 % (ref 36.0–46.0)
Hemoglobin: 13.3 g/dL (ref 12.0–15.0)
Immature Granulocytes: 0 %
Lymphocytes Relative: 33 %
Lymphs Abs: 4.8 10*3/uL — ABNORMAL HIGH (ref 0.7–4.0)
MCH: 28.7 pg (ref 26.0–34.0)
MCHC: 33.3 g/dL (ref 30.0–36.0)
MCV: 86.4 fL (ref 80.0–100.0)
Monocytes Absolute: 0.8 10*3/uL (ref 0.1–1.0)
Monocytes Relative: 5 %
Neutro Abs: 8.7 10*3/uL — ABNORMAL HIGH (ref 1.7–7.7)
Neutrophils Relative %: 59 %
Platelets: 335 10*3/uL (ref 150–400)
RBC: 4.63 MIL/uL (ref 3.87–5.11)
RDW: 12.8 % (ref 11.5–15.5)
WBC: 14.7 10*3/uL — ABNORMAL HIGH (ref 4.0–10.5)
nRBC: 0 % (ref 0.0–0.2)

## 2023-04-21 LAB — COMPREHENSIVE METABOLIC PANEL
ALT: 19 U/L (ref 0–44)
AST: 26 U/L (ref 15–41)
Albumin: 3.9 g/dL (ref 3.5–5.0)
Alkaline Phosphatase: 37 U/L — ABNORMAL LOW (ref 38–126)
Anion gap: 12 (ref 5–15)
BUN: 15 mg/dL (ref 6–20)
CO2: 22 mmol/L (ref 22–32)
Calcium: 9.2 mg/dL (ref 8.9–10.3)
Chloride: 98 mmol/L (ref 98–111)
Creatinine, Ser: 0.6 mg/dL (ref 0.44–1.00)
GFR, Estimated: >60 mL/min (ref >60)
Glucose, Bld: 103 mg/dL — ABNORMAL HIGH (ref 70–99)
Potassium: 3.6 mmol/L (ref 3.5–5.1)
Sodium: 132 mmol/L — ABNORMAL LOW (ref 135–145)
Total Bilirubin: 0.5 mg/dL (ref 0.0–1.2)
Total Protein: 7.3 g/dL (ref 6.5–8.1)

## 2023-04-21 LAB — URINALYSIS, MICROSCOPIC (REFLEX)

## 2023-04-21 LAB — URINALYSIS, ROUTINE W REFLEX MICROSCOPIC
Bilirubin Urine: NEGATIVE
Glucose, UA: NEGATIVE mg/dL
Ketones, ur: NEGATIVE mg/dL
Nitrite: NEGATIVE
Protein, ur: NEGATIVE mg/dL
Specific Gravity, Urine: >=1.03 (ref 1.005–1.030)
pH: 5.5 (ref 5.0–8.0)

## 2023-04-21 LAB — PREGNANCY, URINE: Preg Test, Ur: NEGATIVE

## 2023-04-21 LAB — CBG MONITORING, ED: Glucose-Capillary: 95 mg/dL (ref 70–99)

## 2023-04-21 LAB — LIPASE, BLOOD: Lipase: 46 U/L (ref 11–51)

## 2023-04-21 NOTE — ED Triage Notes (Signed)
 States her blood sugar has been low today, last check 98 and she has a h/a, nausea . Takes Monjario for NIDDM

## 2023-04-22 ENCOUNTER — Telehealth: Payer: Self-pay

## 2023-04-22 ENCOUNTER — Other Ambulatory Visit (HOSPITAL_COMMUNITY)
Admission: RE | Admit: 2023-04-22 | Discharge: 2023-04-22 | Disposition: A | Discharge status: Home / Self Care | Payer: 59 | Source: Ambulatory Visit | Attending: Obstetrics & Gynecology | Admitting: Obstetrics & Gynecology

## 2023-04-22 ENCOUNTER — Other Ambulatory Visit: Payer: Self-pay | Admitting: Family Medicine

## 2023-04-22 ENCOUNTER — Emergency Department (HOSPITAL_BASED_OUTPATIENT_CLINIC_OR_DEPARTMENT_OTHER)
Admission: EM | Admit: 2023-04-22 | Discharge: 2023-04-22 | Disposition: A | Discharge status: Home / Self Care | Payer: 59 | Attending: Emergency Medicine | Admitting: Emergency Medicine

## 2023-04-22 ENCOUNTER — Ambulatory Visit (INDEPENDENT_AMBULATORY_CARE_PROVIDER_SITE_OTHER): Payer: 59 | Admitting: Obstetrics & Gynecology

## 2023-04-22 ENCOUNTER — Encounter: Payer: Self-pay | Admitting: Obstetrics & Gynecology

## 2023-04-22 VITALS — BP 110/77 | HR 74 | Ht 67.0 in | Wt 200.0 lb

## 2023-04-22 DIAGNOSIS — Z01419 Encounter for gynecological examination (general) (routine) without abnormal findings: Secondary | ICD-10-CM

## 2023-04-22 DIAGNOSIS — Z1339 Encounter for screening examination for other mental health and behavioral disorders: Secondary | ICD-10-CM

## 2023-04-22 DIAGNOSIS — Z8742 Personal history of other diseases of the female genital tract: Secondary | ICD-10-CM | POA: Insufficient documentation

## 2023-04-22 DIAGNOSIS — E1165 Type 2 diabetes mellitus with hyperglycemia: Secondary | ICD-10-CM

## 2023-04-22 DIAGNOSIS — E162 Hypoglycemia, unspecified: Secondary | ICD-10-CM

## 2023-04-22 DIAGNOSIS — R519 Headache, unspecified: Secondary | ICD-10-CM

## 2023-04-22 LAB — CBG MONITORING, ED: Glucose-Capillary: 128 mg/dL — ABNORMAL HIGH (ref 70–99)

## 2023-04-22 MED ORDER — KETOROLAC TROMETHAMINE 30 MG/ML IJ SOLN
30.0000 mg | Freq: Once | INTRAMUSCULAR | Status: AC
  Administered 2023-04-22: 30 mg via INTRAVENOUS
  Filled 2023-04-22: qty 1

## 2023-04-22 MED ORDER — PROCHLORPERAZINE EDISYLATE 10 MG/2ML IJ SOLN
10.0000 mg | Freq: Once | INTRAMUSCULAR | Status: AC
  Administered 2023-04-22: 10 mg via INTRAVENOUS
  Filled 2023-04-22: qty 2

## 2023-04-22 NOTE — Telephone Encounter (Signed)
 Pt seen at ED

## 2023-04-22 NOTE — Telephone Encounter (Signed)
 Initial Comment Caller states her blood pressure has been low was a 100 before drinkng juice before that it was 70. Concern to fall sleep GOTO Facility Not Listed [facility] Translation No Nurse Assessment Nurse: Julietta Ogren, RN, Nevin Barcelona Date/Time (Eastern Time): 04/21/2023 8:03:07 PM Confirm and document reason for call. If symptomatic, describe symptoms. ---Caller states has DM2 and is on 7.5mg  of Mounjaro  weekly on Monday. States did not feel great this morning it was 67 fasting, before lunch it was 44, took about an hour to get up to 101 ate 2 pancakes with syrup, almonds, lollipop. Ate pizza piece pepporoni and sausage and a bowl of chili, an hour after dinner it was 79, drank some juice and it is 100. States feels nauseated. Does the patient have any new or worsening symptoms? ---Yes Will a triage be completed? ---Yes Related visit to physician within the last 2 weeks? ---No Does the PT have any chronic conditions? (i.e. diabetes, asthma, this includes High risk factors for pregnancy, etc.) ---Yes List chronic conditions. ---DM2, Hypothyroid Is the patient pregnant or possibly pregnant? (Ask all females between the ages of 22-55) ---No Is this a behavioral health or substance abuse call? ---No Guidelines Guideline Title Affirmed Question Affirmed Notes Nurse Date/Time (Eastern Time) Diabetes - Low Blood Sugar [1] Low blood sugar symptoms persist > Julietta Ogren, RN, [place] 04/21/2023 8:11:13 PM PLEASE NOTE: All timestamps contained within this report are represented as [place] Standard Time. CONFIDENTIALTY NOTICE: This fax transmission is intended only for the addressee. It contains information that is legally privileged, confidential or otherwise protected from use or disclosure. If you are not the intended recipient, you are strictly prohibited from reviewing, disclosing, copying using or disseminating any of this information or taking any action in reliance on or regarding this  information. If you have received this fax in error, please notify us  immediately by telephone so that we can arrange for its return to us . Phone: [phone], Toll-Free: [phone], Fax: [phone] Page: 2 of 2 Call Id: [id number] Guidelines Guideline Title Affirmed Question Affirmed Notes Nurse Date/Time [facility] Time) 30 minutes AND [2] using low blood sugar Care Advice Disp. Time [facility] Time) Disposition Final User 04/21/2023 8:15:36 PM Go to ED Now Yes Julietta Ogren, RN, Nevin Barcelona Final Disposition 04/21/2023 8:15:36 PM Go to ED Now Yes Julietta Ogren, RN, Nevin Barcelona Caller Disagree/Comply Comply Caller Understands Yes PreDisposition InappropriateToAsk Care Advice Given Per Guideline GO TO ED NOW: * You need to be seen in the Emergency Department. CARE ADVICE given per Diabetes - Low Blood Sugar (Adult) guideline. Referrals GO TO FACILITY OTHER - SPECIFY

## 2023-04-22 NOTE — ED Provider Notes (Signed)
 [facility]  Provider Note  CSN: [id number] Arrival date & time: 04/21/23 2038  History Chief Complaint  Patient presents with   Hypoglycemia    H/A, nausea    Debra West is a 48 y.o. female with history of DM on Mounjaro  weekly (most recent dose was 1/13) reports low glucose during the day, down to 45 prior to lunch, improved after eating but did not come up over 100. She has had a frontal headache and nausea for the last several hours. Not similar to previous headache types, but not thunderclap or worse headache of her life. She denies any recent illness or changes in medication doses.    Home Medications Prior to Admission medications   Medication Sig Start Date End Date Taking? Authorizing Provider  acetaminophen  (TYLENOL ) 500 MG tablet Take by mouth. 07/14/18   [provider]  albuterol  (VENTOLIN  HFA) 108 (90 Base) MCG/ACT inhaler Inhale 1-2 puffs into the lungs every 6 (six) hours as needed. 02/09/23   Angelia Kelp, PA-C  Azelastine  HCl 137 MCG/SPRAY SOLN PLACE 1 SPRAY INTO BOTH NOSTRILS 2 (TWO) TIMES DAILY. USE IN EACH NOSTRIL AS DIRECTED 03/13/23   Drubel, Heidi Llamas, PA-C  Calcium  Carb-Cholecalciferol  (OYSTER SHELL CALCIUM  W/D) 500-5 MG-MCG TABS Take 1 tablet by mouth daily.    [provider]  Cholecalciferol  (VITAMIN D-3) 125 MCG (5000 UT) TABS Take by mouth.    [provider]  cyclobenzaprine  (FLEXERIL ) 5 MG tablet Take 1 tablet by mouth as needed. 08/04/18   [provider]  FLUoxetine  (PROZAC ) 20 MG capsule Take 1 capsule (20 mg total) by mouth daily. 03/30/23   Jobe Mulder, DO  fluticasone  (FLONASE ) 50 MCG/ACT nasal spray Place 2 sprays into both nostrils daily. 03/17/23   Jobe Mulder, DO  ibuprofen  (ADVIL ) 200 MG tablet 1 tablet with food or milk as needed Orally Three times a day    [provider]  levothyroxine  (SYNTHROID ) 50 MCG tablet Take 1 tablet  (50 mcg total) by mouth daily. 03/30/23   Jobe Mulder, DO  loratadine  (CLARITIN ) 10 MG tablet TAKE 1 TABLET BY MOUTH EVERY DAY 04/13/23   Trenton Frock, PA-C  Norethindrone  Acetate-Ethinyl Estradiol (LOESTRIN) 1.5-30 MG-MCG tablet TAKE 1 TABLET DAILY 12/15/22   Jobe Mulder, DO  rosuvastatin  (CRESTOR ) 20 MG tablet Take 1 tablet (20 mg total) by mouth daily. 04/15/22   Jobe Mulder, DO  tirzepatide  (MOUNJARO ) 7.5 MG/0.5ML Pen INJECT 7.5 MG SUBCUTANEOUSLY WEEKLY 04/07/23   Wendling, Shellie Dials, DO  vitamin B-12 (CYANOCOBALAMIN ) 1000 MCG tablet Take 1,000 mcg by mouth daily.    [provider]     Allergies    Depo-medrol  [methylprednisolone  sodium succ], Raspberry, and Aspirin   Review of Systems   Review of Systems Please see HPI for pertinent positives and negatives  Physical Exam BP 127/82 (BP Location: Right Arm)   Pulse 85   Temp 98.3 F (36.8 C) (Oral)   Resp 18   Ht 5\' 7"  (1.702 m)   Wt 88.5 kg   SpO2 98%   BMI 30.54 kg/m   Physical Exam Vitals and nursing note reviewed.  Constitutional:      Appearance: Normal appearance.  HENT:     Head: Normocephalic and atraumatic.     Nose: Nose normal.     Mouth/Throat:     Mouth: Mucous membranes are moist.  Eyes:     Extraocular Movements: Extraocular movements intact.  Conjunctiva/sclera: Conjunctivae normal.  Cardiovascular:     Rate and Rhythm: Normal rate.  Pulmonary:     Effort: Pulmonary effort is normal.     Breath sounds: Normal breath sounds.  Abdominal:     General: Abdomen is flat.     Palpations: Abdomen is soft.     Tenderness: There is no abdominal tenderness.  Musculoskeletal:        General: No swelling. Normal range of motion.     Cervical back: Neck supple.  Skin:    General: Skin is warm and dry.  Neurological:     General: No focal deficit present.     Mental Status: She is alert.     Cranial Nerves: No cranial nerve deficit.     Motor: No  weakness.     Gait: Gait normal.  Psychiatric:        Mood and Affect: Mood normal.     ED Results / Procedures / Treatments   EKG None  Procedures Procedures  Medications Ordered in the ED Medications  ketorolac  (TORADOL ) 30 MG/ML injection 30 mg (30 mg Intravenous Given 04/22/23 0049)  prochlorperazine  (COMPAZINE ) injection 10 mg (10 mg Intravenous Given 04/22/23 0049)    Initial Impression and Plan  Patient here with headache in setting of low CBG earlier in the day. She denies missed meals or changes in doses of her meds. On arrival, CBG was in normal range. Labs done in triage show CBC with mild leukocytosis, CMP is unremarkable, lipase is normal. UA is neg. Will recheck glucose now, give headache cocktail and reassess.   ED Course   Clinical Course as of 04/22/23 0120  Wed Apr 22, 2023  0054 Repeat CBG remains adequate. No further episodes of hypoglycemia.  [CS]  0118 Headache has improved. Patient is comfortable going home. Recommend she continue to monitor her CBG at home closely. PCP follow up, RTED for any other concerns.   [CS]    Clinical Course User Index [CS] Charmayne Cooper, MD     MDM Rules/Calculators/A&P Medical Decision Making Problems Addressed: Acute nonintractable headache, unspecified headache type: acute illness or injury Hypoglycemia: acute illness or injury  Amount and/or Complexity of Data Reviewed Labs: ordered. Decision-making details documented in ED Course.  Risk Prescription drug management.     Final Clinical Impression(s) / ED Diagnoses Final diagnoses:  Hypoglycemia  Acute nonintractable headache, unspecified headache type    Rx / DC Orders ED Discharge Orders     None        Charmayne Cooper, MD 04/22/23 0120

## 2023-04-22 NOTE — ED Notes (Signed)
 AVS provided by edp was reviewed with pt. Pt denies any questions. Pt to go home with family waiting for her in the lobby. Pt ambulatory without difficulty at this time.

## 2023-04-22 NOTE — Progress Notes (Signed)
 Subjective:     Debra West is a 48 y.o. female here for a routine exam.  Current complaints: was on the ED last night due to hypoglycemia. Her glc was in the 40's and she felt horrible. She is on Monjoro. Has an appt with her primary care doc on Friday.      Gynecologic History Patient's last menstrual period was 02/11/2023 (exact date). Contraception: OCP (estrogen/progesterone) Last Pap: 02/26/2022. Results were: ASCUS neg hrHPV Last mammogram: 01/16/2023. Results were: normal  Obstetric History OB History  Gravida Para Term Preterm AB Living  0 0 0 0 0 0  SAB IAB Ectopic Multiple Live Births  0 0 0 0 0     The following portions of the patient's history were reviewed and updated as appropriate: allergies, current medications, past family history, past medical history, past social history, past surgical history, and problem list.  Review of Systems Pertinent items are noted in HPI.    Objective:  BP 110/77 (BP Location: Left Arm, Patient Position: Sitting, Cuff Size: Large)   Pulse 74   Ht 5\' 7"  (1.702 m)   Wt 200 lb (90.7 kg)   LMP 02/11/2023 (Exact Date)   BMI 31.32 kg/m  General Appearance:    Alert, cooperative, no distress, appears stated age  Head:    Normocephalic, without obvious abnormality, atraumatic  Eyes:    conjunctiva/corneas clear, EOM's intact, both eyes  Ears:    Normal external ear canals, both ears  Nose:   Nares normal, septum midline, mucosa normal, no drainage    or sinus tenderness  Throat:   Lips, mucosa, and tongue normal; teeth and gums normal  Neck:   Supple, symmetrical, trachea midline, no adenopathy;    thyroid :  no enlargement/tenderness/nodules  Back:     Symmetric, no curvature, ROM normal, no CVA tenderness  Lungs:     respirations unlabored  Chest Wall:    No tenderness or deformity   Heart:    Regular rate and rhythm  Breast Exam:    No tenderness, masses, or nipple abnormality  Abdomen:     Soft, non-tender, bowel sounds active  all four quadrants,    no masses, no organomegaly  Genitalia:    Normal female without lesion, discharge or tenderness     Extremities:   Extremities normal, atraumatic, no cyanosis or edema  Pulses:   2+ and symmetric all extremities  Skin:   Skin color, texture, turgor normal, no rashes or lesions     Assessment:    Healthy female exam.  H/o abnormal PAP   Plan:   Diagnoses and all orders for this visit:  Well woman exam with routine gynecological exam -     Cytology - PAP( [facility])  History of abnormal cervical Pap smear -     Cytology - PAP( [facility])   F/u in 1 year or sooner prn   Kdyn Vonbehren L. Harraway-Smith, M.D., FACOG

## 2023-04-24 ENCOUNTER — Encounter: Payer: Self-pay | Admitting: Family Medicine

## 2023-04-24 ENCOUNTER — Ambulatory Visit: Payer: 59 | Admitting: Family Medicine

## 2023-04-24 VITALS — BP 126/78 | HR 85 | Temp 98.0°F | Resp 16 | Ht 67.0 in | Wt 200.8 lb

## 2023-04-24 DIAGNOSIS — G43909 Migraine, unspecified, not intractable, without status migrainosus: Secondary | ICD-10-CM

## 2023-04-24 DIAGNOSIS — Z7985 Long-term (current) use of injectable non-insulin antidiabetic drugs: Secondary | ICD-10-CM | POA: Diagnosis not present

## 2023-04-24 DIAGNOSIS — E1165 Type 2 diabetes mellitus with hyperglycemia: Secondary | ICD-10-CM | POA: Diagnosis not present

## 2023-04-24 MED ORDER — GVOKE HYPOPEN 1-PACK 1 MG/0.2ML ~~LOC~~ SOAJ: SUBCUTANEOUS | 2 refills | Status: AC

## 2023-04-24 MED ORDER — RIZATRIPTAN BENZOATE 10 MG PO TABS: 10.0000 mg | ORAL_TABLET | ORAL | 1 refills | Status: DC | PRN

## 2023-04-24 NOTE — Progress Notes (Signed)
Chief Complaint  Patient presents with   Follow-up    ER follow up    Subjective: Patient is a 48 y.o. female here for ER follow-up.  On 04/22/2023, the patient went to the emergency department for hypoglycemia.  She started to drop on Tuesday and had difficulty coming back up.  She took her Mounjaro 7.5 mg on Monday.  Diet and exercise has been constant.  No recent illness.  She did travel both her and her neighbors driveway 2 days prior to taking the shot.  Denies fevers also.  Overall compliant with the Mounjaro.  She is not on any other medications for her diabetes to lower her sugars.  Patient has a history of migraines.  She gets them intermittently but does not have any prescription medication for this.  She is wondering if she can have anything to take on an as-needed basis.  Past Medical History:  Diagnosis Date   Allergy    Anxiety    Arthritis    Cardiac arrhythmia    Chronic headaches    Depression    Diabetes mellitus without complication (HCC)    GERD (gastroesophageal reflux disease)    Hypothyroidism 01/17/2016   Runs in family   Left ovarian cyst 03/16/2014   Overview:  02/2014 2.3 x 2.1 03/15/14 4 x 4.1 septated labs pending started on OCP's   Migraine without aura and without status migrainosus, not intractable    Obstructive sleep apnea syndrome 11/27/2016   wears mouth guard   Pernicious anemia    Sleep apnea    Vasodepressor syncope    Vocal fold paresis, 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    Objective: BP 126/78   Pulse 85   Temp 98 F (36.7 C) (Oral)   Resp 16   Ht 5\' 7"  (1.702 m)   Wt 200 lb 12.8 oz (91.1 kg)   LMP 02/11/2023 (Exact Date)   SpO2 97%   BMI 31.45 kg/m  General: Awake, appears stated age Lungs: No accessory muscle use Psych: Age appropriate judgment and insight, normal affect and mood  Assessment and Plan: Type 2 diabetes mellitus with hyperglycemia, without long-term current use of insulin (HCC) - Plan: Glucagon (GVOKE HYPOPEN 1-PACK) 1  MG/0.2ML SOAJ  Migraine without status migrainosus, not intractable, unspecified migraine type - Plan: rizatriptan (MAXALT) 10 MG tablet  Chronic, possible adverse effect of medication.  Sent in glucagon pen to take as needed.  If she does experience hypoglycemia again, we will decrease her dosage of Mounjaro from 7.5 mg weekly which she is currently taking to 5 mg weekly.  She will let me know on MyChart. Chronic issue, send in Maxalt to take on an as-needed basis.  If it becomes more frequent, she will let us know. Follow-up as originally scheduled. The patient voiced understanding and agreement to the plan.  Jilda Roche [place], DO 04/24/23  1:53 PM

## 2023-04-24 NOTE — Patient Instructions (Signed)
Let me know if you have another drop.   Let us know if you need anything.

## 2023-04-30 LAB — CYTOLOGY - PAP
Comment: NEGATIVE
Diagnosis: NEGATIVE
High risk HPV: NEGATIVE

## 2023-05-04 ENCOUNTER — Other Ambulatory Visit: Payer: Self-pay | Admitting: Family Medicine

## 2023-05-04 DIAGNOSIS — E039 Hypothyroidism, unspecified: Secondary | ICD-10-CM

## 2023-05-04 DIAGNOSIS — F411 Generalized anxiety disorder: Secondary | ICD-10-CM

## 2023-05-06 ENCOUNTER — Encounter: Payer: Self-pay | Admitting: Obstetrics & Gynecology

## 2023-05-06 LAB — HM DIABETES EYE EXAM

## 2023-06-24 ENCOUNTER — Encounter: Payer: Self-pay | Admitting: Family Medicine

## 2023-06-24 DIAGNOSIS — E1165 Type 2 diabetes mellitus with hyperglycemia: Secondary | ICD-10-CM

## 2023-06-24 MED ORDER — ROSUVASTATIN CALCIUM 20 MG PO TABS: 20.0000 mg | ORAL_TABLET | Freq: Every day | ORAL | 3 refills | Status: DC

## 2023-06-26 ENCOUNTER — Other Ambulatory Visit: Payer: Self-pay | Admitting: Neurology

## 2023-06-26 DIAGNOSIS — G43909 Migraine, unspecified, not intractable, without status migrainosus: Secondary | ICD-10-CM

## 2023-06-26 MED ORDER — RIZATRIPTAN BENZOATE 10 MG PO TABS: 10.0000 mg | ORAL_TABLET | ORAL | 0 refills | Status: DC | PRN

## 2023-07-21 ENCOUNTER — Other Ambulatory Visit: Payer: Self-pay | Admitting: Family Medicine

## 2023-07-21 DIAGNOSIS — G43909 Migraine, unspecified, not intractable, without status migrainosus: Secondary | ICD-10-CM

## 2023-07-27 ENCOUNTER — Other Ambulatory Visit: Payer: Self-pay | Admitting: Family Medicine

## 2023-07-28 ENCOUNTER — Other Ambulatory Visit (HOSPITAL_BASED_OUTPATIENT_CLINIC_OR_DEPARTMENT_OTHER): Payer: Self-pay

## 2023-07-28 MED ORDER — CYCLOBENZAPRINE HCL 5 MG PO TABS
5.0000 mg | ORAL_TABLET | ORAL | 1 refills | Status: AC | PRN
  Filled 2023-07-28: qty 30, 30d supply, fill #0

## 2023-09-15 ENCOUNTER — Encounter: Payer: Self-pay | Admitting: Family Medicine

## 2023-09-15 ENCOUNTER — Ambulatory Visit: Payer: 59 | Admitting: Family Medicine

## 2023-09-15 VITALS — BP 126/80 | HR 81 | Temp 98.0°F | Resp 16 | Ht 67.0 in | Wt 207.2 lb

## 2023-09-15 DIAGNOSIS — E1165 Type 2 diabetes mellitus with hyperglycemia: Secondary | ICD-10-CM | POA: Diagnosis not present

## 2023-09-15 DIAGNOSIS — Z7985 Long-term (current) use of injectable non-insulin antidiabetic drugs: Secondary | ICD-10-CM | POA: Diagnosis not present

## 2023-09-15 DIAGNOSIS — E039 Hypothyroidism, unspecified: Secondary | ICD-10-CM | POA: Diagnosis not present

## 2023-09-15 MED ORDER — TIRZEPATIDE 5 MG/0.5ML ~~LOC~~ SOAJ: 5.0000 mg | SUBCUTANEOUS | 1 refills | Status: DC

## 2023-09-15 NOTE — Progress Notes (Signed)
 Subjective:   Chief Complaint  Patient presents with   Diabetes    Diabetes     Debra West is a 48 y.o. female here for follow-up of diabetes.   Debra West's self monitored glucose range is low 100's.  Patient confirms hypoglycemic reactions over the past 2 mo. She checks her glucose levels several time(s) per day. Patient does not require insulin.   Medications include: Mounjaro  7.5 mg/week Diet is could be better.  Exercise: active outside, walking No CP or SOB.   Hypothyroidism Patient presents for follow-up of hypothyroidism.  Reports compliance with medication- levothyroxine  50 mcg/d. Current symptoms include: denies fatigue, weight changes, heat/cold intolerance, bowel/skin changes or CVS symptoms She believes her dose should be not significantly changed  Past Medical History:  Diagnosis Date   Allergy     Anxiety    Arthritis    Cardiac arrhythmia    Chronic headaches    Depression    Diabetes mellitus without complication (HCC)    GERD (gastroesophageal reflux disease)    Hypothyroidism 01/17/2016   Runs in family   Left ovarian cyst 03/16/2014   Overview:  02/2014 2.3 x 2.1 03/15/14 4 x 4.1 septated labs pending started on OCP's   Migraine without aura and without status migrainosus, not intractable    Obstructive sleep apnea syndrome 11/27/2016   wears mouth guard   Pernicious anemia    Sleep apnea    Vasodepressor syncope    Vocal fold paresis, 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     Related testing: Retinal exam: Done Pneumovax: not done  Objective:  BP 126/80 (BP Location: Left Arm, Patient Position: Sitting)   Pulse 81   Temp 98 F (36.7 C) (Oral)   Resp 16   Ht 5\' 7"  (1.702 m)   Wt 207 lb 3.2 oz (94 kg)   SpO2 98%   BMI 32.45 kg/m  General:  Well developed, well nourished, in no apparent distress Skin:  Warm, no pallor or diaphoresis Head:  Normocephalic, atraumatic Eyes:  Pupils equal and round, sclera anicteric without injection  Lungs:  CTAB, no access  msc use Cardio:  RRR, no bruits, no LE edema Psych: Age appropriate judgment and insight  Assessment:   Type 2 diabetes mellitus with hyperglycemia, without long-term current use of insulin (HCC) - Plan: Comprehensive metabolic panel with GFR, Hemoglobin A1c, Lipid panel, Microalbumin / creatinine urine ratio  Hypothyroidism, unspecified type - Plan: TSH, T4, free   Plan:   Chronic, stable.  She is having adverse reactions to the Mounjaro  at the 7.5 mg dosage.  We will thus decrease to 5 mg weekly.  She will let me know if she is still having issues.  Counseled on diet and exercise.  PCV 20 next year. Chronic, stable.  Check labs.  Continue levothyroxine  50 mcg daily. F/u in 6 months. The patient voiced understanding and agreement to the plan.  Debra Dials [place], DO 09/15/23 7:41 AM

## 2023-09-15 NOTE — Patient Instructions (Addendum)
 Give [place] 2-3 business days to get the results of your labs back.   Keep the diet clean and stay active.  Let us know if you need anything.

## 2023-09-16 ENCOUNTER — Ambulatory Visit: Payer: Self-pay | Admitting: Family Medicine

## 2023-09-16 LAB — LIPID PANEL
Cholesterol: 124 mg/dL (ref <200)
HDL: 54 mg/dL (ref >=50)
LDL Cholesterol (Calc): 50 mg/dL
Non-HDL Cholesterol (Calc): 70 mg/dL (ref <130)
Total CHOL/HDL Ratio: 2.3 (calc) (ref <5.0)
Triglycerides: 121 mg/dL (ref <150)

## 2023-09-16 LAB — COMPREHENSIVE METABOLIC PANEL WITH GFR
AG Ratio: 1.6 (calc) (ref 1.0–2.5)
ALT: 14 U/L (ref 6–29)
AST: 18 U/L (ref 10–35)
Albumin: 4.2 g/dL (ref 3.6–5.1)
Alkaline phosphatase (APISO): 38 U/L (ref 31–125)
BUN: 10 mg/dL (ref 7–25)
CO2: 26 mmol/L (ref 20–32)
Calcium: 9.3 mg/dL (ref 8.6–10.2)
Chloride: 100 mmol/L (ref 98–110)
Creat: 0.63 mg/dL (ref 0.50–0.99)
Globulin: 2.7 g/dL (ref 1.9–3.7)
Glucose, Bld: 125 mg/dL — ABNORMAL HIGH (ref 65–99)
Potassium: 4.6 mmol/L (ref 3.5–5.3)
Sodium: 135 mmol/L (ref 135–146)
Total Bilirubin: 0.4 mg/dL (ref 0.2–1.2)
Total Protein: 6.9 g/dL (ref 6.1–8.1)
eGFR: 109 mL/min/{1.73_m2} (ref >=60)

## 2023-09-16 LAB — HEMOGLOBIN A1C
Hgb A1c MFr Bld: 6.8 % — ABNORMAL HIGH (ref <5.7)
Mean Plasma Glucose: 148 mg/dL
eAG (mmol/L): 8.2 mmol/L

## 2023-09-16 LAB — MICROALBUMIN / CREATININE URINE RATIO
Creatinine, Urine: 74 mg/dL (ref 20–275)
Microalb Creat Ratio: 7 mg/g{creat} (ref <30)
Microalb, Ur: 0.5 mg/dL

## 2023-09-16 LAB — TSH: TSH: 2.96 m[IU]/L

## 2023-09-16 LAB — T4, FREE: Free T4: 1.2 ng/dL (ref 0.8–1.8)

## 2023-11-06 ENCOUNTER — Other Ambulatory Visit (HOSPITAL_COMMUNITY): Payer: Self-pay

## 2023-11-06 NOTE — Telephone Encounter (Signed)
 ERROR

## 2023-11-16 ENCOUNTER — Other Ambulatory Visit: Payer: Self-pay | Admitting: Family Medicine

## 2023-11-16 DIAGNOSIS — N83202 Unspecified ovarian cyst, left side: Secondary | ICD-10-CM

## 2024-01-18 LAB — HM MAMMOGRAPHY

## 2024-02-19 ENCOUNTER — Other Ambulatory Visit: Payer: Self-pay

## 2024-02-19 ENCOUNTER — Encounter: Payer: Self-pay | Admitting: Family Medicine

## 2024-02-19 MED ORDER — TIRZEPATIDE 2.5 MG/0.5ML ~~LOC~~ SOAJ: 2.5000 mg | SUBCUTANEOUS | 2 refills | Status: DC

## 2024-03-12 ENCOUNTER — Other Ambulatory Visit: Payer: Self-pay | Admitting: Medical Genetics

## 2024-03-17 ENCOUNTER — Other Ambulatory Visit: Payer: Self-pay | Admitting: Family Medicine

## 2024-03-18 ENCOUNTER — Encounter: Payer: Self-pay | Admitting: Family Medicine

## 2024-03-18 ENCOUNTER — Ambulatory Visit: Payer: Self-pay | Admitting: Family Medicine

## 2024-03-18 ENCOUNTER — Ambulatory Visit: Admitting: Family Medicine

## 2024-03-18 VITALS — BP 128/82 | HR 74 | Temp 98.0°F | Resp 16 | Ht 67.0 in | Wt 223.0 lb

## 2024-03-18 DIAGNOSIS — G43009 Migraine without aura, not intractable, without status migrainosus: Secondary | ICD-10-CM

## 2024-03-18 DIAGNOSIS — E1165 Type 2 diabetes mellitus with hyperglycemia: Secondary | ICD-10-CM

## 2024-03-18 DIAGNOSIS — E039 Hypothyroidism, unspecified: Secondary | ICD-10-CM

## 2024-03-18 DIAGNOSIS — F411 Generalized anxiety disorder: Secondary | ICD-10-CM

## 2024-03-18 LAB — LIPID PANEL
Cholesterol: 113 mg/dL (ref 0–200)
HDL: 56.8 mg/dL (ref >39.00)
LDL Cholesterol: 36 mg/dL (ref 0–99)
NonHDL: 56.23
Total CHOL/HDL Ratio: 2
Triglycerides: 100 mg/dL (ref 0.0–149.0)
VLDL: 20 mg/dL (ref 0.0–40.0)

## 2024-03-18 LAB — HEMOGLOBIN A1C: Hgb A1c MFr Bld: 6.9 % — ABNORMAL HIGH (ref 4.6–6.5)

## 2024-03-18 LAB — COMPREHENSIVE METABOLIC PANEL WITH GFR
ALT: 20 U/L (ref 0–35)
AST: 27 U/L (ref 0–37)
Albumin: 4.3 g/dL (ref 3.5–5.2)
Alkaline Phosphatase: 37 U/L — ABNORMAL LOW (ref 39–117)
BUN: 14 mg/dL (ref 6–23)
CO2: 26 meq/L (ref 19–32)
Calcium: 9.3 mg/dL (ref 8.4–10.5)
Chloride: 101 meq/L (ref 96–112)
Creatinine, Ser: 0.69 mg/dL (ref 0.40–1.20)
GFR: 101.81 mL/min (ref >60.00)
Glucose, Bld: 117 mg/dL — ABNORMAL HIGH (ref 70–99)
Potassium: 4.2 meq/L (ref 3.5–5.1)
Sodium: 137 meq/L (ref 135–145)
Total Bilirubin: 0.5 mg/dL (ref 0.2–1.2)
Total Protein: 7 g/dL (ref 6.0–8.3)

## 2024-03-18 LAB — TSH: TSH: 3.77 u[IU]/mL (ref 0.35–5.50)

## 2024-03-18 LAB — T4, FREE: Free T4: 0.79 ng/dL (ref 0.60–1.60)

## 2024-03-18 NOTE — Patient Instructions (Addendum)
Give [place] 2-3 business days to get the results of your labs back.   Keep the diet clean and stay active.  Please consider counseling. Contact [phone] to schedule an appointment or inquire about cost/insurance coverage.  [facility] located at [place], [place], [place].  Phone number = [phone].  Dr. Lennice Sites - Adult Psychiatry.    [facility] located at [place], [place], [place]. Phone number = [phone].   [facility] located at [address] [place], [place].  Phone number = [phone].   [facility] located at [place], [place], [place].  Phone number = [phone].  Coping skills Choose 5 that work for you: Take a deep breath Count to 20 Read a book Do a puzzle Meditate Bake Sing Knit Garden Pray Go outside Call a friend Listen to music Take a walk Color Send a note Take a bath Watch a movie Be alone in a quiet place Pet an animal Visit a friend Journal Exercise Stretch   Let us know if you need anything.

## 2024-03-18 NOTE — Progress Notes (Signed)
 Subjective:   Chief Complaint  Patient presents with   Follow-up    Follow up    Debra West is a 48 y.o. female here for follow-up of diabetes.   [name]'s self monitored glucose range is low 100's.  Patient denies hypoglycemic reactions. She checks her glucose levels intermittently Patient does not require insulin.   Medications include: Mounjaro  5 mg weekly Diet could be better.  Exercise: Walking, some strength training  Hypothyroidism Patient presents for follow-up of hypothyroidism.  Reports compliance with medication- levothyroxine  50 mcg/d. Current symptoms include: denies fatigue, weight changes, heat/cold intolerance, bowel/skin changes or CVS symptoms She believes her dose should be not significantly changed  Migraines Taking Maxalt  10 mg prn. Rarely needs to use it.  Sometimes makes her lightheaded/dizzy.  Works well otherwise.  No other adverse effects.  Past Medical History:  Diagnosis Date   Allergy     Anxiety    Arthritis    Cardiac arrhythmia    Chronic headaches    Depression    Diabetes mellitus without complication (HCC)    GERD (gastroesophageal reflux disease)    Hypothyroidism 01/17/2016   Runs in family   Left ovarian cyst 03/16/2014   Overview:  02/2014 2.3 x 2.1 03/15/14 4 x 4.1 septated labs pending started on OCP's   Migraine without aura and without status migrainosus, not intractable    Obstructive sleep apnea syndrome 11/27/2016   wears mouth guard   Pernicious anemia    Sleep apnea    Vasodepressor syncope    Vocal fold paresis, 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     Related testing: Retinal exam: Done Pneumovax: will complete when she turns 49  Objective:  BP 128/82 (BP Location: Left Arm, Patient Position: Sitting)   Pulse 74   Temp 98 F (36.7 C) (Oral)   Resp 16   Ht 5' 7 (1.702 m)   Wt 223 lb (101.2 kg)   SpO2 98%   BMI 34.93 kg/m  General:  Well developed, well nourished, in no apparent distress Neck: Supple, symmetric, no  thyromegaly Lungs:  CTAB, no access msc use Cardio:  RRR, no bruits, no LE edema Psych: Age appropriate judgment and insight  Assessment:   Type 2 diabetes mellitus with hyperglycemia, without long-term current use of insulin (HCC) - Plan: Comprehensive metabolic panel with GFR, Lipid panel, Hemoglobin A1c  GAD (generalized anxiety disorder)  Hypothyroidism, unspecified type - Plan: TSH, T4, free  Migraine without aura and without status migrainosus, not intractable   Plan:   Chronic, stable.  Continue Mounjaro  5 mg weekly.  Counseled on diet and exercise. Chronic, not currently stable.  Continue Prozac  20 mg daily which was originally prescribed for blood pressure control.  She is not seeing a therapist.  Will give her some counseling resources.  She also is able to schedule for work. Chronic, stable.  Continue levothyroxine  50 mcg daily.  Check labs. Chronic, stable.  Continue Maxalt  as needed. F/u in 6 mo. The patient voiced understanding and agreement to the plan.  [name], DO 03/18/2024 8:58 AM

## 2024-03-22 ENCOUNTER — Encounter (INDEPENDENT_AMBULATORY_CARE_PROVIDER_SITE_OTHER): Payer: Self-pay

## 2024-03-22 ENCOUNTER — Encounter: Payer: Self-pay | Admitting: Family Medicine

## 2024-04-04 ENCOUNTER — Encounter: Payer: Self-pay | Admitting: Family Medicine

## 2024-04-04 ENCOUNTER — Other Ambulatory Visit: Payer: Self-pay

## 2024-04-04 MED ORDER — MOUNJARO 5 MG/0.5ML ~~LOC~~ SOAJ: 5.0000 mg | SUBCUTANEOUS | 0 refills | Status: AC

## 2024-04-11 ENCOUNTER — Other Ambulatory Visit

## 2024-04-11 DIAGNOSIS — Z006 Encounter for examination for normal comparison and control in clinical research program: Secondary | ICD-10-CM

## 2024-04-18 ENCOUNTER — Other Ambulatory Visit: Payer: Self-pay | Admitting: Family Medicine

## 2024-04-18 DIAGNOSIS — E1165 Type 2 diabetes mellitus with hyperglycemia: Secondary | ICD-10-CM

## 2024-04-20 LAB — GENECONNECT MOLECULAR SCREEN: Genetic Analysis Overall Interpretation: NEGATIVE

## 2024-04-26 ENCOUNTER — Other Ambulatory Visit: Payer: Self-pay | Admitting: Family Medicine

## 2024-04-26 DIAGNOSIS — F411 Generalized anxiety disorder: Secondary | ICD-10-CM

## 2024-04-26 DIAGNOSIS — E039 Hypothyroidism, unspecified: Secondary | ICD-10-CM

## 2024-09-16 ENCOUNTER — Encounter: Admitting: Family Medicine
